# Patient Record
Sex: Male | Born: 1980 | Race: Black or African American | Hispanic: No | Marital: Single | Smoking: Current some day smoker
Health system: Southern US, Community
[De-identification: ages and names within clinical notes are randomized; demographics above are authoritative.]

## PROBLEM LIST (undated history)

## (undated) DIAGNOSIS — K219 Gastro-esophageal reflux disease without esophagitis: Secondary | ICD-10-CM

## (undated) DIAGNOSIS — I288 Other diseases of pulmonary vessels: Secondary | ICD-10-CM

## (undated) DIAGNOSIS — I319 Disease of pericardium, unspecified: Secondary | ICD-10-CM

## (undated) DIAGNOSIS — I10 Essential (primary) hypertension: Secondary | ICD-10-CM

## (undated) DIAGNOSIS — I314 Cardiac tamponade: Secondary | ICD-10-CM

## (undated) DIAGNOSIS — Z9289 Personal history of other medical treatment: Secondary | ICD-10-CM

## (undated) DIAGNOSIS — I519 Heart disease, unspecified: Secondary | ICD-10-CM

## (undated) DIAGNOSIS — G4730 Sleep apnea, unspecified: Secondary | ICD-10-CM

## (undated) DIAGNOSIS — E119 Type 2 diabetes mellitus without complications: Secondary | ICD-10-CM

## (undated) HISTORY — DX: Type 2 diabetes mellitus without complications: E11.9

## (undated) HISTORY — DX: Sleep apnea, unspecified: G47.30

## (undated) HISTORY — PX: OTHER SURGICAL HISTORY: SHX169

## (undated) HISTORY — DX: Personal history of other medical treatment: Z92.89

---
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
## 2003-12-06 ENCOUNTER — Emergency Department (HOSPITAL_COMMUNITY): Admission: EM | Admit: 2003-12-06 | Discharge: 2003-12-06 | Payer: Self-pay | Admitting: Family Medicine

## 2004-04-10 ENCOUNTER — Emergency Department (HOSPITAL_COMMUNITY): Admission: EM | Admit: 2004-04-10 | Discharge: 2004-04-10 | Payer: Self-pay | Admitting: Emergency Medicine

## 2004-08-08 ENCOUNTER — Emergency Department (HOSPITAL_COMMUNITY): Admission: EM | Admit: 2004-08-08 | Discharge: 2004-08-08 | Payer: Self-pay | Admitting: Family Medicine

## 2005-01-14 ENCOUNTER — Emergency Department (HOSPITAL_COMMUNITY): Admission: EM | Admit: 2005-01-14 | Discharge: 2005-01-14 | Payer: Self-pay | Admitting: Emergency Medicine

## 2005-01-28 ENCOUNTER — Emergency Department (HOSPITAL_COMMUNITY): Admission: EM | Admit: 2005-01-28 | Discharge: 2005-01-29 | Payer: Self-pay | Admitting: *Deleted

## 2005-02-16 ENCOUNTER — Emergency Department (HOSPITAL_COMMUNITY): Admission: EM | Admit: 2005-02-16 | Discharge: 2005-02-16 | Payer: Self-pay | Admitting: Emergency Medicine

## 2005-07-11 ENCOUNTER — Emergency Department (HOSPITAL_COMMUNITY): Admission: EM | Admit: 2005-07-11 | Discharge: 2005-07-11 | Payer: Self-pay | Admitting: Family Medicine

## 2005-10-28 ENCOUNTER — Emergency Department (HOSPITAL_COMMUNITY): Admission: EM | Admit: 2005-10-28 | Discharge: 2005-10-28 | Payer: Self-pay | Admitting: Family Medicine

## 2006-07-31 ENCOUNTER — Emergency Department (HOSPITAL_COMMUNITY): Admission: EM | Admit: 2006-07-31 | Discharge: 2006-07-31 | Payer: Self-pay | Admitting: Emergency Medicine

## 2006-09-08 ENCOUNTER — Emergency Department (HOSPITAL_COMMUNITY): Admission: EM | Admit: 2006-09-08 | Discharge: 2006-09-08 | Payer: Self-pay | Admitting: Emergency Medicine

## 2007-01-01 ENCOUNTER — Emergency Department (HOSPITAL_COMMUNITY): Admission: EM | Admit: 2007-01-01 | Discharge: 2007-01-01 | Payer: Self-pay | Admitting: Emergency Medicine

## 2007-04-15 ENCOUNTER — Emergency Department (HOSPITAL_COMMUNITY): Admission: EM | Admit: 2007-04-15 | Discharge: 2007-04-15 | Payer: Self-pay | Admitting: Family Medicine

## 2009-02-08 ENCOUNTER — Emergency Department (HOSPITAL_COMMUNITY): Admission: EM | Admit: 2009-02-08 | Discharge: 2009-02-09 | Payer: Self-pay | Admitting: Emergency Medicine

## 2009-05-16 ENCOUNTER — Emergency Department (HOSPITAL_COMMUNITY): Admission: EM | Admit: 2009-05-16 | Discharge: 2009-05-16 | Payer: Self-pay | Admitting: Family Medicine

## 2010-03-25 ENCOUNTER — Emergency Department (HOSPITAL_COMMUNITY): Admission: EM | Admit: 2010-03-25 | Discharge: 2010-03-25 | Payer: Self-pay | Admitting: Family Medicine

## 2010-10-23 ENCOUNTER — Ambulatory Visit (INDEPENDENT_AMBULATORY_CARE_PROVIDER_SITE_OTHER): Payer: Self-pay

## 2010-10-23 ENCOUNTER — Inpatient Hospital Stay (INDEPENDENT_AMBULATORY_CARE_PROVIDER_SITE_OTHER)
Admission: RE | Admit: 2010-10-23 | Discharge: 2010-10-23 | Disposition: A | Discharge status: Home / Self Care | Payer: Self-pay | Source: Ambulatory Visit | Attending: Emergency Medicine | Admitting: Emergency Medicine

## 2010-10-23 DIAGNOSIS — J45909 Unspecified asthma, uncomplicated: Secondary | ICD-10-CM

## 2010-10-23 DIAGNOSIS — J069 Acute upper respiratory infection, unspecified: Secondary | ICD-10-CM

## 2010-11-15 LAB — GC/CHLAMYDIA PROBE AMP, GENITAL: GC Probe Amp, Genital: NEGATIVE

## 2010-11-19 LAB — DIFFERENTIAL
Basophils Absolute: 0 10*3/uL (ref 0.0–0.1)
Lymphocytes Relative: 45 % (ref 12–46)
Neutro Abs: 2.2 10*3/uL (ref 1.7–7.7)

## 2010-11-19 LAB — COMPREHENSIVE METABOLIC PANEL
BUN: 10 mg/dL (ref 6–23)
CO2: 27 mEq/L (ref 19–32)
Calcium: 8.9 mg/dL (ref 8.4–10.5)
Chloride: 106 mEq/L (ref 96–112)
Creatinine, Ser: 1.06 mg/dL (ref 0.4–1.5)
GFR calc non Af Amer: >60 mL/min (ref >60)
Total Bilirubin: 0.6 mg/dL (ref 0.3–1.2)

## 2010-11-19 LAB — CBC
HCT: 41.2 % (ref 39.0–52.0)
MCHC: 32.7 g/dL (ref 30.0–36.0)
MCV: 73.8 fL — ABNORMAL LOW (ref 78.0–100.0)
Platelets: 240 10*3/uL (ref 150–400)
WBC: 5.7 10*3/uL (ref 4.0–10.5)

## 2010-11-19 LAB — LIPASE, BLOOD: Lipase: 15 U/L (ref 11–59)

## 2010-12-27 ENCOUNTER — Inpatient Hospital Stay (INDEPENDENT_AMBULATORY_CARE_PROVIDER_SITE_OTHER)
Admission: RE | Admit: 2010-12-27 | Discharge: 2010-12-27 | Disposition: A | Discharge status: Home / Self Care | Payer: Self-pay | Source: Ambulatory Visit

## 2010-12-27 DIAGNOSIS — J019 Acute sinusitis, unspecified: Secondary | ICD-10-CM

## 2010-12-29 ENCOUNTER — Emergency Department (HOSPITAL_COMMUNITY)
Admission: EM | Admit: 2010-12-29 | Discharge: 2010-12-29 | Disposition: A | Discharge status: Home / Self Care | Payer: Self-pay | Attending: Emergency Medicine | Admitting: Emergency Medicine

## 2010-12-29 ENCOUNTER — Emergency Department (HOSPITAL_COMMUNITY): Payer: Self-pay

## 2010-12-29 DIAGNOSIS — Z79899 Other long term (current) drug therapy: Secondary | ICD-10-CM | POA: Insufficient documentation

## 2010-12-29 DIAGNOSIS — I10 Essential (primary) hypertension: Secondary | ICD-10-CM | POA: Insufficient documentation

## 2010-12-29 DIAGNOSIS — J40 Bronchitis, not specified as acute or chronic: Secondary | ICD-10-CM | POA: Insufficient documentation

## 2010-12-29 DIAGNOSIS — R05 Cough: Secondary | ICD-10-CM | POA: Insufficient documentation

## 2010-12-29 DIAGNOSIS — R059 Cough, unspecified: Secondary | ICD-10-CM | POA: Insufficient documentation

## 2010-12-29 DIAGNOSIS — J329 Chronic sinusitis, unspecified: Secondary | ICD-10-CM | POA: Insufficient documentation

## 2010-12-29 DIAGNOSIS — R0602 Shortness of breath: Secondary | ICD-10-CM | POA: Insufficient documentation

## 2011-08-22 ENCOUNTER — Encounter (HOSPITAL_COMMUNITY): Payer: Self-pay | Admitting: Emergency Medicine

## 2011-08-22 ENCOUNTER — Emergency Department (HOSPITAL_COMMUNITY)
Admission: EM | Admit: 2011-08-22 | Discharge: 2011-08-22 | Disposition: A | Discharge status: Home / Self Care | Payer: Self-pay | Source: Home / Self Care | Attending: Family Medicine | Admitting: Family Medicine

## 2011-08-22 DIAGNOSIS — J069 Acute upper respiratory infection, unspecified: Secondary | ICD-10-CM

## 2011-08-22 MED ORDER — HYDROCOD POLST-CHLORPHEN POLST 10-8 MG/5ML PO LQCR: 5.0000 mL | Freq: Two times a day (BID) | ORAL | Status: DC

## 2011-08-22 MED ORDER — IPRATROPIUM BROMIDE 0.06 % NA SOLN: 2.0000 | Freq: Four times a day (QID) | NASAL | Status: DC

## 2011-08-22 NOTE — ED Notes (Signed)
Nasal congestion and cough since Monday. Sore throat, aches.

## 2011-08-22 NOTE — ED Provider Notes (Signed)
History     CSN: [id number]  Arrival date & time 08/22/11  1813   First MD Initiated Contact with Patient 08/22/11 1831      Chief Complaint  Patient presents with  . Nasal Congestion    (Consider location/radiation/quality/duration/timing/severity/associated sxs/prior treatment) Patient is a 31 y.o. male presenting with URI. The history is provided by the patient.  URI The primary symptoms include sore throat and cough. Primary symptoms do not include wheezing, nausea, vomiting, myalgias or rash. The current episode started 3 to 5 days ago. This is a new problem. The problem has not changed since onset. The onset of the illness is associated with exposure to sick contacts. Symptoms associated with the illness include congestion and rhinorrhea.    History reviewed. No pertinent past medical history.  History reviewed. No pertinent past surgical history.  History reviewed. No pertinent family history.  History  Substance Use Topics  . Smoking status: Never Smoker   . Smokeless tobacco: Not on file  . Alcohol Use: No      Review of Systems  Constitutional: Negative.   HENT: Positive for congestion, sore throat, rhinorrhea and postnasal drip.   Respiratory: Positive for cough. Negative for shortness of breath and wheezing.   Gastrointestinal: Negative.  Negative for nausea and vomiting.  Musculoskeletal: Negative for myalgias.  Skin: Negative for rash.    Allergies  Review of patient's allergies indicates no known allergies.  Home Medications   Current Outpatient Rx  Name Route Sig Dispense Refill  . HYDROCOD POLST-CPM POLST ER 10-8 MG/5ML PO LQCR Oral Take 5 mLs by mouth every 12 (twelve) hours. 115 mL 0  . IPRATROPIUM BROMIDE 0.06 % NA SOLN Nasal Place 2 sprays into the nose 4 (four) times daily. 15 mL 12    BP 152/92  Pulse 88  Temp(Src) 98.7 F (37.1 C) (Oral)  Resp 18  SpO2 99%  Physical Exam  Nursing note and vitals reviewed. Constitutional: He is  oriented to person, place, and time. He appears well-developed and well-nourished.  HENT:  Head: Normocephalic.  Right Ear: External ear normal.  Left Ear: External ear normal.  Nose: Nose normal.  Mouth/Throat: Oropharynx is clear and moist.  Eyes: Conjunctivae and EOM are normal. Pupils are equal, round, and reactive to light.  Neck: Normal range of motion. Neck supple.  Cardiovascular: Normal rate and normal heart sounds.   Pulmonary/Chest: Effort normal and breath sounds normal.  Lymphadenopathy:    He has cervical adenopathy.  Neurological: He is alert and oriented to person, place, and time.  Skin: Skin is warm and dry. No rash noted.  Psychiatric: He has a normal mood and affect.    ED Course  Procedures (including critical care time)  Labs Reviewed - No data to display No results found.   1. URI (upper respiratory infection)       MDM          Barkley Bruns, MD 08/22/11 2007

## 2012-01-16 ENCOUNTER — Emergency Department (HOSPITAL_COMMUNITY): Payer: Self-pay

## 2012-01-16 ENCOUNTER — Encounter (HOSPITAL_COMMUNITY): Payer: Self-pay | Admitting: *Deleted

## 2012-01-16 ENCOUNTER — Emergency Department (HOSPITAL_COMMUNITY)
Admission: EM | Admit: 2012-01-16 | Discharge: 2012-01-16 | Disposition: A | Discharge status: Home / Self Care | Payer: Self-pay | Attending: Emergency Medicine | Admitting: Emergency Medicine

## 2012-01-16 DIAGNOSIS — R042 Hemoptysis: Secondary | ICD-10-CM | POA: Insufficient documentation

## 2012-01-16 DIAGNOSIS — R0609 Other forms of dyspnea: Secondary | ICD-10-CM | POA: Insufficient documentation

## 2012-01-16 DIAGNOSIS — Z79899 Other long term (current) drug therapy: Secondary | ICD-10-CM | POA: Insufficient documentation

## 2012-01-16 DIAGNOSIS — J189 Pneumonia, unspecified organism: Secondary | ICD-10-CM | POA: Insufficient documentation

## 2012-01-16 DIAGNOSIS — R0989 Other specified symptoms and signs involving the circulatory and respiratory systems: Secondary | ICD-10-CM | POA: Insufficient documentation

## 2012-01-16 LAB — POCT I-STAT, CHEM 8
BUN: 12 mg/dL (ref 6–23)
Chloride: 102 mEq/L (ref 96–112)
Creatinine, Ser: 1.1 mg/dL (ref 0.50–1.35)
Hemoglobin: 16 g/dL (ref 13.0–17.0)
Potassium: 3.8 mEq/L (ref 3.5–5.1)
Sodium: 141 mEq/L (ref 135–145)

## 2012-01-16 LAB — POCT I-STAT TROPONIN I

## 2012-01-16 LAB — CBC
Hemoglobin: 13.3 g/dL (ref 13.0–17.0)
RBC: 5.73 MIL/uL (ref 4.22–5.81)
WBC: 5 10*3/uL (ref 4.0–10.5)

## 2012-01-16 MED ORDER — DEXTROSE 5 % IV SOLN
1.0000 g | Freq: Once | INTRAVENOUS | Status: AC
  Administered 2012-01-16: 1 g via INTRAVENOUS
  Filled 2012-01-16: qty 10

## 2012-01-16 MED ORDER — DOXYCYCLINE HYCLATE 100 MG PO TABS
100.0000 mg | ORAL_TABLET | Freq: Once | ORAL | Status: AC
  Administered 2012-01-16: 100 mg via ORAL
  Filled 2012-01-16: qty 1

## 2012-01-16 MED ORDER — DOXYCYCLINE HYCLATE 100 MG PO CAPS: 100.0000 mg | ORAL_CAPSULE | Freq: Two times a day (BID) | ORAL | Status: AC

## 2012-01-16 NOTE — ED Notes (Addendum)
Pt states he was tx for bronchitis 1 month prior at urgent care.  He has been using his inhaler since then with no relief.  Pt states increased sternal pain and L flank pain x 1 week.  Today he began coughing up blood.  Pt states that he has also been experiencing night sweats and chills.  Emesis x 1 yesterday (not post coughing).

## 2012-01-16 NOTE — ED Provider Notes (Signed)
History     CSN: [id number]  Arrival date & time 01/16/12  2054   First MD Initiated Contact with Patient 01/16/12 2207      Chief Complaint  Patient presents with  . Chest Pain  . Hemoptysis    (Consider location/radiation/quality/duration/timing/severity/associated sxs/prior treatment) HPI  31 year old male without any meaningful medical history presents today with a two-week history of cough and shortness of breath. The cough is nonproductive for the last one week. His more recently had a left lateral chest pain and some substernal pain as well only when coughing. He denies any arm or jaw pain. He does endorse dyspnea with exertion. He also endorses chills although he has not taken his temperature at home. He was treated for bronchitis approximately one month ago.    he endorses a single episode of emesis after coughing yesterday. Otherwise normal stooling, urine output, and negative for nausea and vomiting. On arrival temperature 98.28F, pulse 82, respirations 22, saturations 98% on room air.  History reviewed. No pertinent past medical history.  History reviewed. No pertinent past surgical history.  No family history on file.  History  Substance Use Topics  . Smoking status: Never Smoker   . Smokeless tobacco: Not on file  . Alcohol Use: No      Review of Systems Constitutional: Negative for fever and POS chills.  HENT: Negative for ear pain, sore throat and trouble swallowing.   Eyes: Negative for pain and visual disturbance.  Respiratory: POS for cough and shortness of breath.   Cardiovascular: POS for chest pain (only while coughing) and neg leg swelling.  Gastrointestinal: Negative for nausea, vomiting, abdominal pain and diarrhea.  Genitourinary: Negative for dysuria, urgency and frequency.  Musculoskeletal: Negative for back pain and joint swelling.  Skin: Negative for rash and wound.  Neurological: Negative for dizziness, syncope, speech difficulty, weakness  and numbness.    Allergies  Review of patient's allergies indicates no known allergies.  Home Medications   Current Outpatient Rx  Name Route Sig Dispense Refill  . NYQUIL COLD & FLU PO Oral Take 1 tablet by mouth as needed. For cough and cold.    . IBUPROFEN 800 MG PO TABS Oral Take 800 mg by mouth 2 (two) times daily.    . NYQUIL PO Oral Take 1 tablet by mouth at bedtime.      Pulse 82  Temp(Src) 98.8 F (37.1 C) (Oral)  Resp 22  SpO2 98%  Physical Exam Consitutional: Pt in no acute distress.   Head: Normocephalic and atraumatic.  Eyes: Extraocular motion intact, no scleral icterus Neck: Supple without meningismus, mass, or overt JVD Respiratory: Effort normal. Course BSs.  Scattered wheezing LUL, and subtle rales LLL. No respiratory distress. CV: Heart regular rate and rhythm, no obvious murmurs.  Pulses +2 and symmetric Abdomen: Soft, non-tender, non-distended MSK: Extremities are atraumatic without deformity, ROM intact Skin: Warm, dry, intact Neuro: Alert and oriented, no motor deficit noted.  Psychiatric: Mood and affect are normal    ED Course  Procedures (including critical care time)  Labs Reviewed  CBC - Abnormal; Notable for the following:    MCV 71.0 (*)    MCH 23.2 (*)    All other components within normal limits  POCT I-STAT, CHEM 8  POCT I-STAT TROPONIN I   Dg Chest 2 View  01/16/2012  *RADIOLOGY REPORT*  Clinical Data: Cough and shortness of breath.  CHEST - 2 VIEW  Comparison: 12/29/2010.  Findings: The cardiac silhouette, mediastinal and hilar  contours are within normal limits and stable.  There are bronchitic type lung changes suggesting bronchitis.  There is also a superimposed left lower lobe infiltrate.  No pleural effusion.  The bony thorax is intact.  IMPRESSION: Bronchitis and left lower lobe pneumonia.  Original Report Authenticated By: P. Loralie Champagne, M.D.     No diagnosis found.    MDM  This patient looks well exam with normal  vital signs good oxygenation and mentation. His exam is consistent with x-ray findings of community-acquired pneumonia. White count normal his saturations very good. Patient will be treated here with Rocephin, discharged home on doxycycline. Patient to follow up with primary care for followup or to return to the emergency department with any concerns or exacerbations. PT DC home stable.  Discussed with pt the clinical impression, treatment in the ED, and follow up plan.  We alslo discussed the indications for returning to the ED, which include shortness or breath, confusion, fever, new weakness or numbness, chest pain, or any other concerning symptom.  The pt understood the treatment and plan, is stable, and is able to leave the ED.    Jonetta Osgood MD 01/16/2012    11:50 PM          Larrie Kass, MD 01/16/12 2350

## 2012-01-16 NOTE — Discharge Instructions (Signed)
Follow up with your providers as dicussed in the ED today and as written above.  See your doctor immediately--or return to the ED--with any new or troubling symptoms including fevers, weakness, new chest pain, shortness or breath, numbness, or any other concerning symptom.  Pneumonia, Adult  Pneumonia is an infection of the lungs.  CAUSES Pneumonia may be caused by bacteria or a virus. Usually, these infections are caused by breathing infectious particles into the lungs (respiratory tract). SYMPTOMS   Cough.   Fever.   Chest pain.   Increased rate of breathing.   Wheezing.   Mucus production.  DIAGNOSIS  If you have the common symptoms of pneumonia, your caregiver will typically confirm the diagnosis with a chest X-ray. The X-ray will show an abnormality in the lung (pulmonary infiltrate) if you have pneumonia. Other tests of your blood, urine, or sputum may be done to find the specific cause of your pneumonia. Your caregiver may also do tests (blood gases or pulse oximetry) to see how well your lungs are working. TREATMENT  Some forms of pneumonia may be spread to other people when you cough or sneeze. You may be asked to wear a mask before and during your exam. Pneumonia that is caused by bacteria is treated with antibiotic medicine. Pneumonia that is caused by the influenza virus may be treated with an antiviral medicine. Most other viral infections must run their course. These infections will not respond to antibiotics.  PREVENTION A pneumococcal shot (vaccine) is available to prevent a common bacterial cause of pneumonia. This is usually suggested for:  People over 69 years old.   Patients on chemotherapy.   People with chronic lung problems, such as bronchitis or emphysema.   People with immune system problems.  If you are over 65 or have a high risk condition, you may receive the pneumococcal vaccine if you have not received it before. In some countries, a routine influenza  vaccine is also recommended. This vaccine can help prevent some cases of pneumonia.You may be offered the influenza vaccine as part of your care. If you smoke, it is time to quit. You may receive instructions on how to stop smoking. Your caregiver can provide medicines and counseling to help you quit. HOME CARE INSTRUCTIONS   Cough suppressants may be used if you are losing too much rest. However, coughing protects you by clearing your lungs. You should avoid using cough suppressants if you can.   Your caregiver may have prescribed medicine if he or she thinks your pneumonia is caused by a bacteria or influenza. Finish your medicine even if you start to feel better.   Your caregiver may also prescribe an expectorant. This loosens the mucus to be coughed up.   Only take over-the-counter or prescription medicines for pain, discomfort, or fever as directed by your caregiver.   Do not smoke. Smoking is a common cause of bronchitis and can contribute to pneumonia. If you are a smoker and continue to smoke, your cough may last several weeks after your pneumonia has cleared.   A cold steam vaporizer or humidifier in your room or home may help loosen mucus.   Coughing is often worse at night. Sleeping in a semi-upright position in a recliner or using a couple pillows under your head will help with this.   Get rest as you feel it is needed. Your body will usually let you know when you need to rest.  SEEK IMMEDIATE MEDICAL CARE IF:   Your illness  becomes worse. This is especially true if you are elderly or weakened from any other disease.   You cannot control your cough with suppressants and are losing sleep.   You begin coughing up blood.   You develop pain which is getting worse or is uncontrolled with medicines.   You have a fever.   Any of the symptoms which initially brought you in for treatment are getting worse rather than better.   You develop shortness of breath or chest pain.  MAKE  SURE YOU:   Understand these instructions.   Will watch your condition.   Will get help right away if you are not doing well or get worse.  Document Released: 07/29/2005 Document Revised: 07/18/2011 Document Reviewed: 10/18/2010 [facility] Patient Information 2012 [place], [place].

## 2012-01-16 NOTE — ED Notes (Signed)
Pt not in room, in xray at this time

## 2012-01-18 NOTE — ED Provider Notes (Addendum)
I saw and evaluated the patient, reviewed the resident's note and I agree with the findings and plan.   Loren Racer, MD 01/18/12 0710   Date: 02/15/2012  Rate: 85  Rhythm: normal sinus rhythm  QRS Axis: normal  Intervals: normal  ST/T Wave abnormalities: normal  Conduction Disutrbances:none  Narrative Interpretation:   Old EKG Reviewed: none available    Loren Racer, MD 02/15/12 [phone]

## 2012-02-23 ENCOUNTER — Encounter (HOSPITAL_COMMUNITY): Payer: Self-pay | Admitting: Emergency Medicine

## 2012-02-23 ENCOUNTER — Emergency Department (HOSPITAL_COMMUNITY)
Admission: EM | Admit: 2012-02-23 | Discharge: 2012-02-24 | Disposition: A | Discharge status: Home / Self Care | Payer: PRIVATE HEALTH INSURANCE | Attending: Emergency Medicine | Admitting: Emergency Medicine

## 2012-02-23 DIAGNOSIS — H5710 Ocular pain, unspecified eye: Secondary | ICD-10-CM | POA: Insufficient documentation

## 2012-02-23 DIAGNOSIS — H5711 Ocular pain, right eye: Secondary | ICD-10-CM

## 2012-02-23 MED ORDER — FLUORESCEIN SODIUM 1 MG OP STRP
1.0000 | ORAL_STRIP | Freq: Once | OPHTHALMIC | Status: AC
  Administered 2012-02-24: 1 via OPHTHALMIC
  Filled 2012-02-23: qty 1

## 2012-02-23 MED ORDER — PROPARACAINE HCL 0.5 % OP SOLN
1.0000 [drp] | Freq: Once | OPHTHALMIC | Status: AC
  Administered 2012-02-24: 1 [drp] via OPHTHALMIC
  Filled 2012-02-23: qty 15

## 2012-02-23 NOTE — ED Notes (Addendum)
Patient complaining of right eye irritation that started today.  Patient felt that his contact was irritating him, so he removed the contact.  Patient states that his vision is in "grayscale" with his right eye (new onset today); can see colors with his left eye.  Eye is red upon assessment; clear drainage noted.  Patient denies any loss of vision (other than change in color); reports light sensitivity.  Pupils equal and reactive.

## 2012-02-24 MED ORDER — MOXIFLOXACIN HCL 0.5 % OP SOLN: 1.0000 [drp] | Freq: Three times a day (TID) | OPHTHALMIC | Status: AC

## 2012-02-24 NOTE — ED Notes (Signed)
Vigamox too expensive per pt.  Requesting diff med.  I called C. Latanya Maudlin and she gave a verbal order for Ciloxan opthalmic solution, place 1 drop into the Right eye TID, #32mls.  This rx was called into [facility] at [phone]. And pt was notified.

## 2012-02-24 NOTE — ED Provider Notes (Signed)
History     CSN: [id number]  Arrival date & time 02/23/12  2247   First MD Initiated Contact with Patient 02/23/12 2323      Chief Complaint  Patient presents with  . Eye Pain    (Consider location/radiation/quality/duration/timing/severity/associated sxs/prior treatment) HPI History from patient. 31 year old male presents with right eye pain. He is a contact lens wearer and reports that he began to experience blurry vision to that eye earlier in the day today. No known injury. No known foreign body exposure. Patient did take out his contact but his symptoms persisted. He does not wear daily disposables. He used Visine eyedrops without relief of his symptoms. No history of anything like this before. States he feels as if there is something stuck in his eye and has noticed increased redness and watering. He was seen by an optometrist or ophthalmologist in [place] previously. Not currently followed by a local physician.  The triage note states that the patient was complaining of loss of color vision but he denies this to me, stating that his vision is simply blurry.  History reviewed. No pertinent past medical history.  History reviewed. No pertinent past surgical history.  History reviewed. No pertinent family history.  History  Substance Use Topics  . Smoking status: Never Smoker   . Smokeless tobacco: Not on file  . Alcohol Use: No      Review of Systems  Constitutional: Negative.   HENT: Negative for rhinorrhea.   Eyes: Positive for photophobia, pain, discharge, redness and visual disturbance. Negative for itching.  Respiratory: Negative for chest tightness and shortness of breath.   Neurological: Negative for dizziness and weakness.    Allergies  Review of patient's allergies indicates no known allergies.  Home Medications   Current Outpatient Rx  Name Route Sig Dispense Refill  . IBUPROFEN 800 MG PO TABS Oral Take 800 mg by mouth 2 (two) times daily.      BP  143/101  Pulse 76  Temp 98.7 F (37.1 C) (Oral)  Resp 20  SpO2 99%  Vision 20/100 in right eye (uncorrected), 20/20 in the left eye (corrected) Physical Exam  Nursing note and vitals reviewed. Constitutional: He appears well-developed and well-nourished. No distress.  HENT:  Head: Normocephalic and atraumatic.  Eyes: Conjunctivae, EOM and lids are normal. Pupils are equal, round, and reactive to light. No foreign bodies found.  Slit lamp exam:      The right eye shows fluorescein uptake. The right eye shows no corneal ulcer and no foreign body.       Punctate uptake of fluorescein, sclera mildly injected, excess tearing  Neck: Normal range of motion.  Cardiovascular: Normal rate.   Pulmonary/Chest: Effort normal.  Musculoskeletal: Normal range of motion.  Neurological: He is alert.  Skin: Skin is warm and dry. He is not diaphoretic.  Psychiatric: He has a normal mood and affect.    ED Course  Procedures (including critical care time)  Labs Reviewed - No data to display No results found.   1. Pain in right eye       MDM   patient presents with pain to his right eye. He is a contact lens wearer. No known injury but he does complain of foreign body sensation. He had immediate relief with administration of proparacaine drops. Patient examined by both myself and Dr. Nicanor Alcon. Patient does appear to have punctate uptake of fluorescein, and given his symptoms, we'll give coverage with antibiotic drops. He was instructed that he needs  to follow up with ophthalmology tomorrow for recheck. He is to discard his current peer contacts and wear glasses for the next week while taking the drops. Reasons to return to the ED discussed.       Grant Fontana, PA-C 02/24/12 [phone]

## 2012-02-24 NOTE — ED Provider Notes (Signed)
Medical screening examination/treatment/procedure(s) were performed by non-physician practitioner and as supervising physician I was immediately available for consultation/collaboration.  Lorris Carducci K Amardeep Beckers-Rasch, MD 02/24/12 0539 

## 2012-11-15 ENCOUNTER — Emergency Department (HOSPITAL_COMMUNITY)
Admission: EM | Admit: 2012-11-15 | Discharge: 2012-11-15 | Disposition: A | Discharge status: Home / Self Care | Payer: Self-pay | Attending: Emergency Medicine | Admitting: Emergency Medicine

## 2012-11-15 ENCOUNTER — Encounter (HOSPITAL_COMMUNITY): Payer: Self-pay | Admitting: Emergency Medicine

## 2012-11-15 DIAGNOSIS — K529 Noninfective gastroenteritis and colitis, unspecified: Secondary | ICD-10-CM

## 2012-11-15 DIAGNOSIS — Z791 Long term (current) use of non-steroidal anti-inflammatories (NSAID): Secondary | ICD-10-CM | POA: Insufficient documentation

## 2012-11-15 DIAGNOSIS — R197 Diarrhea, unspecified: Secondary | ICD-10-CM | POA: Insufficient documentation

## 2012-11-15 DIAGNOSIS — K5289 Other specified noninfective gastroenteritis and colitis: Secondary | ICD-10-CM | POA: Insufficient documentation

## 2012-11-15 DIAGNOSIS — R638 Other symptoms and signs concerning food and fluid intake: Secondary | ICD-10-CM | POA: Insufficient documentation

## 2012-11-15 DIAGNOSIS — R5381 Other malaise: Secondary | ICD-10-CM | POA: Insufficient documentation

## 2012-11-15 DIAGNOSIS — R509 Fever, unspecified: Secondary | ICD-10-CM | POA: Insufficient documentation

## 2012-11-15 DIAGNOSIS — R1013 Epigastric pain: Secondary | ICD-10-CM | POA: Insufficient documentation

## 2012-11-15 DIAGNOSIS — R5383 Other fatigue: Secondary | ICD-10-CM | POA: Insufficient documentation

## 2012-11-15 LAB — COMPREHENSIVE METABOLIC PANEL
BUN: 13 mg/dL (ref 6–23)
CO2: 29 mEq/L (ref 19–32)
Calcium: 9.2 mg/dL (ref 8.4–10.5)
Chloride: 99 mEq/L (ref 96–112)
Creatinine, Ser: 0.93 mg/dL (ref 0.50–1.35)
GFR calc Af Amer: >90 mL/min (ref >90)
GFR calc non Af Amer: >90 mL/min (ref >90)
Glucose, Bld: 101 mg/dL — ABNORMAL HIGH (ref 70–99)
Total Bilirubin: 0.6 mg/dL (ref 0.3–1.2)

## 2012-11-15 LAB — URINALYSIS, ROUTINE W REFLEX MICROSCOPIC
Glucose, UA: NEGATIVE mg/dL
Ketones, ur: NEGATIVE mg/dL
Leukocytes, UA: NEGATIVE
Nitrite: NEGATIVE
Protein, ur: NEGATIVE mg/dL
Urobilinogen, UA: 0.2 mg/dL (ref 0.0–1.0)

## 2012-11-15 LAB — CBC WITH DIFFERENTIAL/PLATELET
Eosinophils Relative: 3 % (ref 0–5)
HCT: 41.6 % (ref 39.0–52.0)
Lymphs Abs: 1.3 10*3/uL (ref 0.7–4.0)
MCH: 23.3 pg — ABNORMAL LOW (ref 26.0–34.0)
MCV: 71.4 fL — ABNORMAL LOW (ref 78.0–100.0)
Monocytes Absolute: 0.3 10*3/uL (ref 0.1–1.0)
Monocytes Relative: 5 % (ref 3–12)
Neutro Abs: 4.4 10*3/uL (ref 1.7–7.7)
Platelets: 273 10*3/uL (ref 150–400)
RBC: 5.83 MIL/uL — ABNORMAL HIGH (ref 4.22–5.81)
RDW: 15 % (ref 11.5–15.5)
WBC: 6.2 10*3/uL (ref 4.0–10.5)

## 2012-11-15 LAB — LIPASE, BLOOD: Lipase: 16 U/L (ref 11–59)

## 2012-11-15 MED ORDER — ONDANSETRON HCL 4 MG/2ML IJ SOLN
4.0000 mg | Freq: Once | INTRAMUSCULAR | Status: AC
  Administered 2012-11-15: 4 mg via INTRAVENOUS
  Filled 2012-11-15: qty 2

## 2012-11-15 MED ORDER — ONDANSETRON HCL 4 MG PO TABS: 4.0000 mg | ORAL_TABLET | Freq: Four times a day (QID) | ORAL | Status: DC

## 2012-11-15 MED ORDER — SODIUM CHLORIDE 0.9 % IV BOLUS (SEPSIS)
1000.0000 mL | Freq: Once | INTRAVENOUS | Status: AC
  Administered 2012-11-15: 1000 mL via INTRAVENOUS

## 2012-11-15 NOTE — ED Provider Notes (Signed)
History     CSN: [id number]  Arrival date & time 11/15/12  4098   First MD Initiated Contact with Patient 11/15/12 [phone]      Chief Complaint  Patient presents with  . Emesis  . Diarrhea  . Abdominal Pain    (Consider location/radiation/quality/duration/timing/severity/associated sxs/prior treatment) HPI Comments: Patient presents with nausea, vomiting and diarrhea that onset at 6 PM yesterday. He's had 2 episodes of nonbloody nonbilious emesis with cramping and loose stools. She's had frequent loose bowel movements overnight. States he had a fever of 104 yesterday. Denies any chest pain, shortness of breath, lightheadedness or dizziness. Has some crampy upper abdominal pain that improves with nothing. Unable to get a drink since yesterday morning. Father was admitted yesterday with similar symptoms. son developed symptoms this morning and is being seen as well.  The history is provided by the patient and a relative.    History reviewed. No pertinent past medical history.  History reviewed. No pertinent past surgical history.  No family history on file.  History  Substance Use Topics  . Smoking status: Never Smoker   . Smokeless tobacco: Not on file  . Alcohol Use: No      Review of Systems  Constitutional: Positive for fever, activity change, appetite change and fatigue.  HENT: Negative for congestion and rhinorrhea.   Respiratory: Negative for cough, chest tightness and shortness of breath.   Cardiovascular: Negative for chest pain.  Gastrointestinal: Positive for nausea, vomiting, abdominal pain and diarrhea.  Genitourinary: Negative for dysuria.  Musculoskeletal: Negative for back pain.  Skin: Negative for rash.  Neurological: Positive for weakness. Negative for dizziness and headaches.  A complete 10 system review of systems was obtained and all systems are negative except as noted in the HPI and PMH.    Allergies  Review of patient's allergies indicates no known  allergies.  Home Medications   Current Outpatient Rx  Name  Route  Sig  Dispense  Refill  . ibuprofen (ADVIL,MOTRIN) 800 MG tablet   Oral   Take 800 mg by mouth 2 (two) times daily.         . ondansetron (ZOFRAN) 4 MG tablet   Oral   Take 1 tablet (4 mg total) by mouth every 6 (six) hours.   12 tablet   0     BP 116/62  Pulse 89  Temp(Src) 99.3 F (37.4 C) (Oral)  Resp 18  SpO2 100%  Physical Exam  Constitutional: He is oriented to person, place, and time. He appears well-developed and well-nourished. No distress.  HENT:  Head: Normocephalic and atraumatic.  Mouth/Throat: Oropharynx is clear and moist. No oropharyngeal exudate.  Eyes: Conjunctivae and EOM are normal. Pupils are equal, round, and reactive to light.  Neck: Normal range of motion. Neck supple.  Cardiovascular: Normal rate, regular rhythm and normal heart sounds.   No murmur heard. Pulmonary/Chest: Effort normal and breath sounds normal. No respiratory distress.  Abdominal: Soft. There is tenderness. There is no rebound and no guarding.  Mild epigastric tenderness Negative Murphy's sign. No pain at McBurney's point  Musculoskeletal: Normal range of motion. He exhibits no edema and no tenderness.  Neurological: He is alert and oriented to person, place, and time. No cranial nerve deficit. He exhibits normal muscle tone. Coordination normal.  Skin: Skin is warm.    ED Course  Procedures (including critical care time)  Labs Reviewed  CBC WITH DIFFERENTIAL - Abnormal; Notable for the following:    RBC 5.83 (*)  MCV 71.4 (*)    MCH 23.3 (*)    All other components within normal limits  COMPREHENSIVE METABOLIC PANEL - Abnormal; Notable for the following:    Glucose, Bld 101 (*)    All other components within normal limits  URINALYSIS, ROUTINE W REFLEX MICROSCOPIC - Abnormal; Notable for the following:    Specific Gravity, Urine 1.036 (*)    All other components within normal limits  LIPASE, BLOOD    No results found.   1. Gastroenteritis       MDM  Nausea, vomiting and diarrhea for the past 8 hours was sick contacts at home. Afebrile, no distress, abdomen soft and nonsurgical. Given IV hydration, antiemetics. Tolerating by mouth in the ED. No vomiting. No ketones in urine.  Suspect viral gastroenteritis given similar symptoms and was. Patient appears nontoxic and well-hydrated. Antiemetics and oral hydration at home. Return precautions discussed.       Glynn Octave, MD 11/15/12 [phone]

## 2012-11-15 NOTE — ED Notes (Signed)
Onset of diarrhea and vomiting onset 1800 yesterday. Describes diarrhea 4-5 x times every hour, not as much vomiting. Some mid abdominal pain.

## 2013-05-27 ENCOUNTER — Encounter (HOSPITAL_COMMUNITY): Payer: Self-pay | Admitting: Emergency Medicine

## 2013-05-27 ENCOUNTER — Emergency Department (HOSPITAL_COMMUNITY)
Admission: EM | Admit: 2013-05-27 | Discharge: 2013-05-28 | Disposition: A | Discharge status: Home / Self Care | Payer: PRIVATE HEALTH INSURANCE | Attending: Emergency Medicine | Admitting: Emergency Medicine

## 2013-05-27 DIAGNOSIS — R1013 Epigastric pain: Secondary | ICD-10-CM | POA: Insufficient documentation

## 2013-05-27 DIAGNOSIS — Z791 Long term (current) use of non-steroidal anti-inflammatories (NSAID): Secondary | ICD-10-CM | POA: Insufficient documentation

## 2013-05-27 DIAGNOSIS — K219 Gastro-esophageal reflux disease without esophagitis: Secondary | ICD-10-CM | POA: Insufficient documentation

## 2013-05-27 LAB — CBC
HCT: 38.7 % — ABNORMAL LOW (ref 39.0–52.0)
MCHC: 33.9 g/dL (ref 30.0–36.0)
Platelets: 262 10*3/uL (ref 150–400)
RDW: 14.8 % (ref 11.5–15.5)

## 2013-05-27 LAB — POCT I-STAT TROPONIN I: Troponin i, poc: 0 ng/mL (ref 0.00–0.08)

## 2013-05-27 LAB — BASIC METABOLIC PANEL
BUN: 13 mg/dL (ref 6–23)
GFR calc Af Amer: >90 mL/min (ref >90)
GFR calc non Af Amer: >90 mL/min (ref >90)
Potassium: 3.6 mEq/L (ref 3.5–5.1)

## 2013-05-27 MED ORDER — GI COCKTAIL ~~LOC~~
30.0000 mL | Freq: Once | ORAL | Status: AC
  Administered 2013-05-28: 30 mL via ORAL
  Filled 2013-05-27: qty 30

## 2013-05-27 MED ORDER — FAMOTIDINE 20 MG PO TABS
40.0000 mg | ORAL_TABLET | Freq: Once | ORAL | Status: AC
  Administered 2013-05-28: 40 mg via ORAL
  Filled 2013-05-27: qty 2

## 2013-05-27 NOTE — ED Notes (Signed)
Presents with sternal chest pain that began Monday night described as "just a hurt" worse with eating and drinking.  On Tuesday pt reports pain with swallowing and "it feels like the food tearing me up and it wants to come back up" denies SOB, denies nausea. Nothing makes pain better.

## 2013-05-27 NOTE — ED Provider Notes (Signed)
CSN: [id number]     Arrival date & time 05/27/13  2213 History   First MD Initiated Contact with Patient 05/27/13 2327     Chief Complaint  Patient presents with  . Chest Pain   (Consider location/radiation/quality/duration/timing/severity/associated sxs/prior Treatment) Patient is a 32 y.o. male presenting with chest pain. The history is provided by the patient.  Chest Pain  patient here complaining of epigastric pain that is worse with eating and drinking x4 days. Pain characterized as burning and nonradiating. No associated syncope or near-syncope. No anginal type symptoms of exertional dyspnea or diaphoresis. No vomiting or diarrhea. Denies any black or bloody stools. No treatment used prior to arrival. No history of same. Denies any orthopnea or dyspnea on exertion. Patient has not seen a provider prior to arrival. Denies any surgical abdominal history. History reviewed. No pertinent past medical history. History reviewed. No pertinent past surgical history. History reviewed. No pertinent family history. History  Substance Use Topics  . Smoking status: Never Smoker   . Smokeless tobacco: Not on file  . Alcohol Use: No    Review of Systems  Cardiovascular: Positive for chest pain.  All other systems reviewed and are negative.    Allergies  Review of patient's allergies indicates no known allergies.  Home Medications   Current Outpatient Rx  Name  Route  Sig  Dispense  Refill  . bismuth subsalicylate (PEPTO BISMOL) 262 MG chewable tablet   Oral   Chew 1,572 mg by mouth daily as needed for indigestion.         Marland Kitchen ibuprofen (ADVIL,MOTRIN) 800 MG tablet   Oral   Take 800 mg by mouth 2 (two) times daily.          BP 146/75  Pulse 97  Temp(Src) 98.4 F (36.9 C) (Oral)  Resp 20  Wt 378 lb 7 oz (171.658 kg)  SpO2 100% Physical Exam  Nursing note and vitals reviewed. Constitutional: He is oriented to person, place, and time. He appears well-developed and  well-nourished.  Non-toxic appearance. No distress.  HENT:  Head: Normocephalic and atraumatic.  Eyes: Conjunctivae, EOM and lids are normal. Pupils are equal, round, and reactive to light.  Neck: Normal range of motion. Neck supple. No tracheal deviation present. No mass present.  Cardiovascular: Normal rate, regular rhythm and normal heart sounds.  Exam reveals no gallop.   No murmur heard. Pulmonary/Chest: Effort normal and breath sounds normal. No stridor. No respiratory distress. He has no decreased breath sounds. He has no wheezes. He has no rhonchi. He has no rales.  Abdominal: Soft. Normal appearance and bowel sounds are normal. He exhibits no distension. There is tenderness in the epigastric area. There is no rigidity, no rebound, no guarding and no CVA tenderness.    Musculoskeletal: Normal range of motion. He exhibits no edema and no tenderness.  Neurological: He is alert and oriented to person, place, and time. He has normal strength. No cranial nerve deficit or sensory deficit. GCS eye subscore is 4. GCS verbal subscore is 5. GCS motor subscore is 6.  Skin: Skin is warm and dry. No abrasion and no rash noted.  Psychiatric: He has a normal mood and affect. His speech is normal and behavior is normal.    ED Course  Procedures (including critical care time) Labs Review Labs Reviewed  CBC - Abnormal; Notable for the following:    HCT 38.7 (*)    MCV 70.7 (*)    MCH 23.9 (*)  All other components within normal limits  BASIC METABOLIC PANEL  POCT I-STAT TROPONIN I   Imaging Review No results found.  EKG Interpretation     Ventricular Rate:  94 PR Interval:  168 QRS Duration: 88 QT Interval:  336 QTC Calculation: 420 R Axis:   41 Text Interpretation:  Normal sinus rhythm Normal ECG            MDM  No diagnosis found. Pt given meds for gerd and feels better--no concern for acs or pe, will place on ppi and carafte and d/c    Toy Baker, MD 05/28/13  0117

## 2013-05-27 NOTE — ED Notes (Signed)
Pt reports that today is the first day in 2 days since he has able to have a BM but it was small, Pt is feeling nauseated

## 2013-05-28 ENCOUNTER — Emergency Department (HOSPITAL_COMMUNITY): Payer: PRIVATE HEALTH INSURANCE

## 2013-05-28 LAB — HEPATIC FUNCTION PANEL
ALT: 36 U/L (ref 0–53)
Alkaline Phosphatase: 48 U/L (ref 39–117)
Bilirubin, Direct: <0.1 mg/dL (ref 0.0–0.3)

## 2013-05-28 LAB — LIPASE, BLOOD: Lipase: 20 U/L (ref 11–59)

## 2013-05-28 MED ORDER — OXYCODONE-ACETAMINOPHEN 5-325 MG PO TABS
1.0000 | ORAL_TABLET | Freq: Once | ORAL | Status: AC
  Administered 2013-05-28: 1 via ORAL
  Filled 2013-05-28: qty 1

## 2013-05-28 MED ORDER — SUCRALFATE 1 G PO TABS: 1.0000 g | ORAL_TABLET | Freq: Four times a day (QID) | ORAL | Status: DC

## 2013-05-28 MED ORDER — PANTOPRAZOLE SODIUM 20 MG PO TBEC: 20.0000 mg | DELAYED_RELEASE_TABLET | Freq: Every day | ORAL | Status: DC

## 2013-10-10 DIAGNOSIS — I314 Cardiac tamponade: Secondary | ICD-10-CM

## 2013-10-10 HISTORY — DX: Cardiac tamponade: I31.4

## 2013-10-16 ENCOUNTER — Emergency Department (INDEPENDENT_AMBULATORY_CARE_PROVIDER_SITE_OTHER): Payer: Self-pay

## 2013-10-16 ENCOUNTER — Emergency Department (INDEPENDENT_AMBULATORY_CARE_PROVIDER_SITE_OTHER)
Admission: EM | Admit: 2013-10-16 | Discharge: 2013-10-16 | Disposition: A | Discharge status: Home / Self Care | Payer: Self-pay | Source: Home / Self Care | Attending: Family Medicine | Admitting: Family Medicine

## 2013-10-16 ENCOUNTER — Encounter (HOSPITAL_COMMUNITY): Payer: Self-pay | Admitting: Emergency Medicine

## 2013-10-16 DIAGNOSIS — M7520 Bicipital tendinitis, unspecified shoulder: Secondary | ICD-10-CM

## 2013-10-16 DIAGNOSIS — M7522 Bicipital tendinitis, left shoulder: Secondary | ICD-10-CM

## 2013-10-16 MED ORDER — DICLOFENAC SODIUM 1 % TD GEL: 4.0000 g | Freq: Four times a day (QID) | TRANSDERMAL | Status: DC

## 2013-10-16 NOTE — ED Provider Notes (Signed)
CSN: [id number]     Arrival date & time 10/16/13  1646 History   First MD Initiated Contact with Patient 10/16/13 1828     Chief Complaint  Patient presents with  . Shoulder Pain   (Consider location/radiation/quality/duration/timing/severity/associated sxs/prior Treatment) Patient is a 33 y.o. male presenting with shoulder pain. The history is provided by the patient.  Shoulder Pain This is a new problem. The current episode started more than 1 week ago (2 weeks of sx, NKI). The problem has not changed since onset.Pertinent negatives include no chest pain and no abdominal pain.    History reviewed. No pertinent past medical history. History reviewed. No pertinent past surgical history. No family history on file. History  Substance Use Topics  . Smoking status: Never Smoker   . Smokeless tobacco: Not on file  . Alcohol Use: No    Review of Systems  Constitutional: Negative.   Cardiovascular: Negative for chest pain.  Gastrointestinal: Negative for abdominal pain.  Musculoskeletal: Negative for joint swelling.  Skin: Negative.     Allergies  Review of patient's allergies indicates no known allergies.  Home Medications   Current Outpatient Rx  Name  Route  Sig  Dispense  Refill  . bismuth subsalicylate (PEPTO BISMOL) 262 MG chewable tablet   Oral   Chew 1,572 mg by mouth daily as needed for indigestion.         . diclofenac sodium (VOLTAREN) 1 % GEL   Topical   Apply 4 g topically 4 (four) times daily. Please instruct in dosing   3 Tube   1   . ibuprofen (ADVIL,MOTRIN) 800 MG tablet   Oral   Take 800 mg by mouth 2 (two) times daily.         . pantoprazole (PROTONIX) 20 MG tablet   Oral   Take 1 tablet (20 mg total) by mouth daily.   30 tablet   0   . sucralfate (CARAFATE) 1 G tablet   Oral   Take 1 tablet (1 g total) by mouth 4 (four) times daily.   30 tablet   0    BP 139/82  Pulse 100  Temp(Src) 100.1 F (37.8 C) (Oral)  Resp 18  SpO2  100% Physical Exam  Nursing note and vitals reviewed. Constitutional: He is oriented to person, place, and time. He appears well-developed and well-nourished. No distress.  Pulmonary/Chest: Effort normal and breath sounds normal.  Musculoskeletal: He exhibits tenderness.       Left shoulder: He exhibits normal range of motion, no tenderness, no swelling, no crepitus, normal pulse and normal strength.  Neurological: He is alert and oriented to person, place, and time.  Skin: Skin is warm and dry.    ED Course  Procedures (including critical care time) Labs Review Labs Reviewed - No data to display Imaging Review Dg Shoulder Left  10/16/2013   CLINICAL DATA:  Two 3 week history of shoulder pain without history of trauma  EXAM: LEFT SHOULDER - 2+ VIEW  COMPARISON:  None.  FINDINGS: Three views of the left shoulder reveal the bones to be adequately mineralized. There is no evidence of an acute fracture nor dislocation. No significant bony degenerative change is demonstrated. The observed portions of the left clavicle and upper left ribs appear normal.  IMPRESSION: There is no acute bony abnormality of the left shoulder.   Electronically Signed   By: David  [place]   On: 10/16/2013 19:01   X-rays reviewed and report per radiologist.  MDM   1. Bicipital tendonitis of left shoulder        Linna HoffJames D Nicolas Sisler, MD 10/16/13 [phone]

## 2013-10-16 NOTE — Discharge Instructions (Signed)
Ice pack to shoulder for 15 minutes after applying cream to sore area 4 times a day. See orthopedist for further therapy or injection if further problems.

## 2013-10-16 NOTE — ED Notes (Signed)
Patient complains of left shoulder pain for past two weeks Having some discomfort to left side of chest Denies any injury

## 2013-11-01 ENCOUNTER — Emergency Department (HOSPITAL_COMMUNITY): Payer: PRIVATE HEALTH INSURANCE

## 2013-11-01 ENCOUNTER — Encounter (HOSPITAL_COMMUNITY): Payer: Self-pay | Admitting: Emergency Medicine

## 2013-11-01 ENCOUNTER — Inpatient Hospital Stay (HOSPITAL_COMMUNITY)
Admission: EM | Admit: 2013-11-01 | Discharge: 2013-11-04 | DRG: 315 | Disposition: A | Discharge status: Home / Self Care | Payer: Self-pay | Attending: Cardiology | Admitting: Cardiology

## 2013-11-01 DIAGNOSIS — J90 Pleural effusion, not elsewhere classified: Secondary | ICD-10-CM | POA: Diagnosis present

## 2013-11-01 DIAGNOSIS — Z23 Encounter for immunization: Secondary | ICD-10-CM

## 2013-11-01 DIAGNOSIS — I7789 Other specified disorders of arteries and arterioles: Secondary | ICD-10-CM | POA: Diagnosis present

## 2013-11-01 DIAGNOSIS — I309 Acute pericarditis, unspecified: Principal | ICD-10-CM | POA: Diagnosis present

## 2013-11-01 DIAGNOSIS — Z6841 Body Mass Index (BMI) 40.0 and over, adult: Secondary | ICD-10-CM

## 2013-11-01 DIAGNOSIS — I10 Essential (primary) hypertension: Secondary | ICD-10-CM | POA: Diagnosis present

## 2013-11-01 DIAGNOSIS — I313 Pericardial effusion (noninflammatory): Secondary | ICD-10-CM

## 2013-11-01 DIAGNOSIS — D509 Iron deficiency anemia, unspecified: Secondary | ICD-10-CM | POA: Diagnosis present

## 2013-11-01 DIAGNOSIS — D696 Thrombocytopenia, unspecified: Secondary | ICD-10-CM | POA: Diagnosis present

## 2013-11-01 DIAGNOSIS — K219 Gastro-esophageal reflux disease without esophagitis: Secondary | ICD-10-CM | POA: Diagnosis present

## 2013-11-01 DIAGNOSIS — I3139 Other pericardial effusion (noninflammatory): Secondary | ICD-10-CM

## 2013-11-01 DIAGNOSIS — I314 Cardiac tamponade: Secondary | ICD-10-CM | POA: Diagnosis present

## 2013-11-01 HISTORY — DX: Heart disease, unspecified: I51.9

## 2013-11-01 HISTORY — DX: Gastro-esophageal reflux disease without esophagitis: K21.9

## 2013-11-01 HISTORY — DX: Other diseases of pulmonary vessels: I28.8

## 2013-11-01 HISTORY — DX: Essential (primary) hypertension: I10

## 2013-11-01 HISTORY — DX: Morbid (severe) obesity due to excess calories: E66.01

## 2013-11-01 HISTORY — DX: Disease of pericardium, unspecified: I31.9

## 2013-11-01 HISTORY — DX: Cardiac tamponade: I31.4

## 2013-11-01 LAB — URINALYSIS, ROUTINE W REFLEX MICROSCOPIC
Glucose, UA: NEGATIVE mg/dL
Hgb urine dipstick: NEGATIVE
Ketones, ur: >80 mg/dL — AB
LEUKOCYTES UA: NEGATIVE
Nitrite: NEGATIVE
Protein, ur: NEGATIVE mg/dL
SPECIFIC GRAVITY, URINE: 1.028 (ref 1.005–1.030)
UROBILINOGEN UA: 4 mg/dL — AB (ref 0.0–1.0)
pH: 6 (ref 5.0–8.0)

## 2013-11-01 LAB — BASIC METABOLIC PANEL
BUN: 11 mg/dL (ref 6–23)
CHLORIDE: 99 meq/L (ref 96–112)
CO2: 27 meq/L (ref 19–32)
Calcium: 9.2 mg/dL (ref 8.4–10.5)
Creatinine, Ser: 0.92 mg/dL (ref 0.50–1.35)
GFR calc Af Amer: >90 mL/min (ref >90)
GFR calc non Af Amer: >90 mL/min (ref >90)
Glucose, Bld: 102 mg/dL — ABNORMAL HIGH (ref 70–99)
Potassium: 4.3 mEq/L (ref 3.7–5.3)
Sodium: 138 mEq/L (ref 137–147)

## 2013-11-01 LAB — CBC
HCT: 33.9 % — ABNORMAL LOW (ref 39.0–52.0)
Hemoglobin: 11.2 g/dL — ABNORMAL LOW (ref 13.0–17.0)
MCH: 22.7 pg — ABNORMAL LOW (ref 26.0–34.0)
MCHC: 33 g/dL (ref 30.0–36.0)
MCV: 68.6 fL — ABNORMAL LOW (ref 78.0–100.0)
Platelets: 435 10*3/uL — ABNORMAL HIGH (ref 150–400)
RBC: 4.94 MIL/uL (ref 4.22–5.81)
RDW: 14 % (ref 11.5–15.5)
WBC: 10.8 10*3/uL — AB (ref 4.0–10.5)

## 2013-11-01 LAB — I-STAT TROPONIN, ED: TROPONIN I, POC: 0 ng/mL (ref 0.00–0.08)

## 2013-11-01 MED ORDER — DIPHENHYDRAMINE HCL 50 MG/ML IJ SOLN
25.0000 mg | Freq: Once | INTRAMUSCULAR | Status: AC
  Administered 2013-11-01: 25 mg via INTRAVENOUS
  Filled 2013-11-01: qty 1

## 2013-11-01 MED ORDER — METOCLOPRAMIDE HCL 5 MG/ML IJ SOLN
10.0000 mg | Freq: Once | INTRAMUSCULAR | Status: AC
  Administered 2013-11-01: 10 mg via INTRAVENOUS
  Filled 2013-11-01: qty 2

## 2013-11-01 MED ORDER — IOHEXOL 350 MG/ML SOLN
100.0000 mL | Freq: Once | INTRAVENOUS | Status: AC | PRN
  Administered 2013-11-01: 100 mL via INTRAVENOUS

## 2013-11-01 MED ORDER — SODIUM CHLORIDE 0.9 % IV BOLUS (SEPSIS)
1000.0000 mL | Freq: Once | INTRAVENOUS | Status: AC
  Administered 2013-11-01: 1000 mL via INTRAVENOUS

## 2013-11-01 MED ORDER — SODIUM CHLORIDE 0.9 % IV SOLN
INTRAVENOUS | Status: DC
  Administered 2013-11-01 – 2013-11-03 (×3): via INTRAVENOUS

## 2013-11-01 MED ORDER — ACETAMINOPHEN 325 MG PO TABS
650.0000 mg | ORAL_TABLET | Freq: Once | ORAL | Status: AC
  Administered 2013-11-01: 650 mg via ORAL
  Filled 2013-11-01: qty 2

## 2013-11-01 MED ORDER — ASPIRIN 325 MG PO TABS
325.0000 mg | ORAL_TABLET | ORAL | Status: AC
  Administered 2013-11-01: 325 mg via ORAL
  Filled 2013-11-01: qty 1

## 2013-11-01 NOTE — ED Notes (Signed)
Dr. Freida BusmanAllen made aware of pt. pain

## 2013-11-01 NOTE — ED Notes (Signed)
Pt sts he has also been having cp especially with exertion and is having chest pressure right now

## 2013-11-01 NOTE — ED Provider Notes (Signed)
CSN: [id number]     Arrival date & time 11/01/13  1651 History   First MD Initiated Contact with Patient 11/01/13 2035     Chief Complaint  Patient presents with  . Shoulder Pain  . Urinary Tract Infection  . Chest Pain     (Consider location/radiation/quality/duration/timing/severity/associated sxs/prior Treatment) Patient is a 33 y.o. male presenting with shoulder pain, urinary tract infection, and chest pain. The history is provided by the patient.  Shoulder Pain Associated symptoms include chest pain.  Urinary Tract Infection Associated symptoms include chest pain.  Chest Pain  patient complaining of Dysuria without penile drainage or discharge. He has had dark colored urine and left-sided flank pain and was concern that this could be a urinary tract infection. He also notes mid sternal chest discomfort that is better with sitting up forward and worse with lying flat. Discomfort has been persistent. He has also been exertional. Denies any cough or fever. No syncope or near-syncope. No prior history of same. Symptoms better with remaining still and worse with walking. No leg pain or swelling. No vomiting or diarrhea. Denies any palpitations.  History reviewed. No pertinent past medical history. History reviewed. No pertinent past surgical history. No family history on file. History  Substance Use Topics  . Smoking status: Never Smoker   . Smokeless tobacco: Not on file  . Alcohol Use: No    Review of Systems  Cardiovascular: Positive for chest pain.  All other systems reviewed and are negative.      Allergies  Review of patient's allergies indicates no known allergies.  Home Medications   Current Outpatient Rx  Name  Route  Sig  Dispense  Refill  . acetaminophen (TYLENOL) 500 MG tablet   Oral   Take 500 mg by mouth every 6 (six) hours as needed.         . diclofenac sodium (VOLTAREN) 1 % GEL   Topical   Apply 4 g topically 4 (four) times daily. Please instruct  in dosing   3 Tube   1   . ibuprofen (ADVIL,MOTRIN) 800 MG tablet   Oral   Take 800 mg by mouth 2 (two) times daily.          BP 121/63  Pulse 102  Temp(Src) 100.4 F (38 C) (Oral)  Resp 15  Ht 5\' 10"  (1.778 m)  Wt 380 lb (172.367 kg)  BMI 54.52 kg/m2  SpO2 99% Physical Exam  Nursing note and vitals reviewed. Constitutional: He is oriented to person, place, and time. He appears well-developed and well-nourished.  Non-toxic appearance. No distress.  HENT:  Head: Normocephalic and atraumatic.  Eyes: Conjunctivae, EOM and lids are normal. Pupils are equal, round, and reactive to light.  Neck: Normal range of motion. Neck supple. No tracheal deviation present. No mass present.  Cardiovascular: Normal rate, regular rhythm and normal heart sounds.  Exam reveals no gallop.   No murmur heard. Pulmonary/Chest: Effort normal and breath sounds normal. No stridor. No respiratory distress. He has no decreased breath sounds. He has no wheezes. He has no rhonchi. He has no rales.  Abdominal: Soft. Normal appearance and bowel sounds are normal. He exhibits no distension. There is no tenderness. There is no rebound and no CVA tenderness.  Musculoskeletal: Normal range of motion. He exhibits no edema and no tenderness.  Neurological: He is alert and oriented to person, place, and time. He has normal strength. No cranial nerve deficit or sensory deficit. GCS eye subscore is 4. GCS  verbal subscore is 5. GCS motor subscore is 6.  Skin: Skin is warm and dry. No abrasion and no rash noted.  Psychiatric: He has a normal mood and affect. His speech is normal and behavior is normal.    ED Course  Procedures (including critical care time) Labs Review Labs Reviewed  CBC - Abnormal; Notable for the following:    WBC 10.8 (*)    Hemoglobin 11.2 (*)    HCT 33.9 (*)    MCV 68.6 (*)    MCH 22.7 (*)    Platelets 435 (*)    All other components within normal limits  BASIC METABOLIC PANEL - Abnormal;  Notable for the following:    Glucose, Bld 102 (*)    All other components within normal limits  URINALYSIS, ROUTINE W REFLEX MICROSCOPIC - Abnormal; Notable for the following:    Color, Urine AMBER (*)    APPearance CLOUDY (*)    Bilirubin Urine SMALL (*)    Ketones, ur >80 (*)    Urobilinogen, UA 4.0 (*)    All other components within normal limits  Rosezena SensorI-STAT TROPOININ, ED   Imaging Review Dg Chest 2 View  11/01/2013   CLINICAL DATA:  33 year old male with shortness of breath and weakness.  EXAM: CHEST  2 VIEW  COMPARISON:  05/28/2013  FINDINGS: Enlargement of the cardiopericardial silhouette has increased since the prior study and a pericardial effusion is not excluded.  There is no evidence of focal airspace disease, pulmonary edema, suspicious pulmonary nodule/mass, pleural effusion, or pneumothorax. No acute bony abnormalities are identified.  IMPRESSION: Interval enlargement of the cardiopericardial silhouette - pericardial effusion is not excluded.  No other significant abnormalities noted.   Electronically Signed   By: Laveda AbbeJeff  Hu M.D.   On: 11/01/2013 19:21     EKG Interpretation   Date/Time:  Monday November 01 2013 17:27:40 EDT Ventricular Rate:  104 PR Interval:  160 QRS Duration: 88 QT Interval:  300 QTC Calculation: 394 R Axis:   42 Text Interpretation:  Sinus tachycardia Nonspecific ST and T wave  abnormality Abnormal ECG Confirmed by Freida BusmanALLEN  MD, Zamyra Allensworth ([id number]) on  11/01/2013 8:36:12 PM      MDM   Final diagnoses:  None    Pt given tylenol and meds for his HA--large peri-cardial effusion noted, spoke with cards, dr. Jearld PiesMcclean, will admit pt    Toy BakerAnthony T Sadey Yandell, MD 11/02/13 [phone]

## 2013-11-01 NOTE — ED Notes (Signed)
Patient back from CT scan at this time. Preparing to move to Pod C, while waiting for Dispo status.

## 2013-11-01 NOTE — ED Notes (Signed)
Caleb [name] from CT called sts appx 2 people ahead of patient. Pt updated.

## 2013-11-01 NOTE — ED Notes (Signed)
Pt reports he went to urgent care 2 weeks ago and was dx with tendonitis on left shoulder and tired the cream they prescribed him but it hasn't help much. Then a few days later he noticed some pain to left lateral abd, tried some OTC meds, no relief. Noticed dark colored urine and tried drinking some extra water. Denies dysuria at this moment but sts that he has had it the first couple of days. sts he has been having some urinary frequency. Nad, skin warm and dry, rsep e/u.

## 2013-11-02 ENCOUNTER — Encounter (HOSPITAL_COMMUNITY)
Admission: EM | Disposition: A | Discharge status: Home / Self Care | Payer: Self-pay | Source: Home / Self Care | Attending: Cardiology

## 2013-11-02 DIAGNOSIS — I319 Disease of pericardium, unspecified: Secondary | ICD-10-CM

## 2013-11-02 DIAGNOSIS — I309 Acute pericarditis, unspecified: Principal | ICD-10-CM

## 2013-11-02 DIAGNOSIS — I314 Cardiac tamponade: Secondary | ICD-10-CM

## 2013-11-02 HISTORY — PX: PERICARDIAL TAP: SHX5486

## 2013-11-02 LAB — BASIC METABOLIC PANEL
BUN: 9 mg/dL (ref 6–23)
CHLORIDE: 99 meq/L (ref 96–112)
CO2: 27 meq/L (ref 19–32)
Calcium: 8.6 mg/dL (ref 8.4–10.5)
Creatinine, Ser: 0.96 mg/dL (ref 0.50–1.35)
GFR calc Af Amer: >90 mL/min (ref >90)
GFR calc non Af Amer: >90 mL/min (ref >90)
Glucose, Bld: 118 mg/dL — ABNORMAL HIGH (ref 70–99)
Potassium: 4.1 mEq/L (ref 3.7–5.3)
Sodium: 137 mEq/L (ref 137–147)

## 2013-11-02 LAB — BODY FLUID CELL COUNT WITH DIFFERENTIAL
EOS FL: 0 %
Lymphs, Fluid: 73 %
Monocyte-Macrophage-Serous Fluid: 6 % — ABNORMAL LOW (ref 50–90)
Neutrophil Count, Fluid: 21 % (ref 0–25)
WBC FLUID: 8957 uL — AB (ref 0–1000)

## 2013-11-02 LAB — CREATININE, FLUID (PLEURAL, PERITONEAL, JP DRAINAGE): Creat, Fluid: 0.8 mg/dL

## 2013-11-02 LAB — GLUCOSE, SEROUS FLUID: Glucose, Fluid: 72 mg/dL

## 2013-11-02 LAB — TSH: TSH: 1.957 u[IU]/mL (ref 0.350–4.500)

## 2013-11-02 LAB — C-REACTIVE PROTEIN: CRP: 23.2 mg/dL — AB (ref <0.60)

## 2013-11-02 LAB — LACTATE DEHYDROGENASE, PLEURAL OR PERITONEAL FLUID: LD FL: 2206 U/L — AB (ref 3–23)

## 2013-11-02 LAB — MRSA PCR SCREENING: MRSA BY PCR: NEGATIVE

## 2013-11-02 LAB — SEDIMENTATION RATE: Sed Rate: 50 mm/hr — ABNORMAL HIGH (ref 0–16)

## 2013-11-02 SURGERY — PERICARDIAL TAP
Anesthesia: LOCAL

## 2013-11-02 MED ORDER — INFLUENZA VAC SPLIT QUAD 0.5 ML IM SUSP
0.5000 mL | INTRAMUSCULAR | Status: AC
  Administered 2013-11-04: 0.5 mL via INTRAMUSCULAR
  Filled 2013-11-02 (×2): qty 0.5

## 2013-11-02 MED ORDER — POLYETHYLENE GLYCOL 3350 17 G PO PACK
17.0000 g | PACK | Freq: Every day | ORAL | Status: DC
Start: 2013-11-02 — End: 2013-11-04
  Administered 2013-11-02 – 2013-11-03 (×2): 17 g via ORAL
  Filled 2013-11-02 (×3): qty 1

## 2013-11-02 MED ORDER — FENTANYL CITRATE 0.05 MG/ML IJ SOLN
INTRAMUSCULAR | Status: AC
  Filled 2013-11-02: qty 2

## 2013-11-02 MED ORDER — COLCHICINE 0.6 MG PO TABS
0.6000 mg | ORAL_TABLET | Freq: Two times a day (BID) | ORAL | Status: DC
  Administered 2013-11-02 – 2013-11-04 (×6): 0.6 mg via ORAL
  Filled 2013-11-02 (×8): qty 1

## 2013-11-02 MED ORDER — HEPARIN (PORCINE) IN NACL 2-0.9 UNIT/ML-% IJ SOLN
INTRAMUSCULAR | Status: AC
  Filled 2013-11-02: qty 1000

## 2013-11-02 MED ORDER — MIDAZOLAM HCL 2 MG/2ML IJ SOLN
INTRAMUSCULAR | Status: AC
  Filled 2013-11-02: qty 2

## 2013-11-02 MED ORDER — PANTOPRAZOLE SODIUM 40 MG PO TBEC
40.0000 mg | DELAYED_RELEASE_TABLET | Freq: Every day | ORAL | Status: DC
  Administered 2013-11-02 – 2013-11-04 (×3): 40 mg via ORAL
  Filled 2013-11-02 (×3): qty 1

## 2013-11-02 MED ORDER — HYDROCODONE-ACETAMINOPHEN 5-325 MG PO TABS
2.0000 | ORAL_TABLET | ORAL | Status: DC | PRN
Start: 2013-11-02 — End: 2013-11-04
  Administered 2013-11-02 – 2013-11-03 (×3): 2 via ORAL
  Filled 2013-11-02 (×4): qty 2

## 2013-11-02 MED ORDER — LIDOCAINE HCL (PF) 1 % IJ SOLN
INTRAMUSCULAR | Status: AC
  Filled 2013-11-02: qty 30

## 2013-11-02 MED ORDER — ZOLPIDEM TARTRATE 5 MG PO TABS
5.0000 mg | ORAL_TABLET | Freq: Every evening | ORAL | Status: DC | PRN
  Administered 2013-11-02 – 2013-11-03 (×2): 5 mg via ORAL
  Filled 2013-11-02 (×2): qty 1

## 2013-11-02 MED ORDER — IBUPROFEN 800 MG PO TABS
800.0000 mg | ORAL_TABLET | Freq: Three times a day (TID) | ORAL | Status: DC
  Administered 2013-11-02 – 2013-11-04 (×7): 800 mg via ORAL
  Filled 2013-11-02 (×10): qty 1

## 2013-11-02 NOTE — H&P (View-Only) (Signed)
Patient Name: Caleb [name]. Date of Encounter: 11/02/2013  Active Problems:   Acute pericarditis   Length of Stay: 1  SUBJECTIVE  Orthopneic. Mild pleuritic retrosternal pain, intensified by lying flat. Afebrile. Not dizzy. BP relatively, but lower than yesterday, not tachycardic  CRP 23, ESR 50 Echo c/w near tamponade  CURRENT MEDS . colchicine  0.6 mg Oral BID  . ibuprofen  800 mg Oral Q8H  . pantoprazole  40 mg Oral Q1200  . polyethylene glycol  17 g Oral Daily    OBJECTIVE   Intake/Output Summary (Last 24 hours) at 11/02/13 1343 Last data filed at 11/02/13 0600  Gross per 24 hour  Intake    625 ml  Output      0 ml  Net    625 ml   Filed Weights   11/01/13 1708  Weight: 172.367 kg (380 lb)    PHYSICAL EXAM Filed Vitals:   11/02/13 0400 11/02/13 0700 11/02/13 0800 11/02/13 1208  BP: 151/90 151/111 153/106 160/111  Pulse:      Temp: 99 F (37.2 C) 98 F (36.7 C)  98 F (36.7 C)  TempSrc: Oral Oral  Oral  Resp:  20  20  Height:      Weight:      SpO2:  99%  99%   General: Alert, oriented x3, no distress, morbidly obese Head: no evidence of trauma, PERRL, EOMI, no exophtalmos or lid lag, no myxedema, no xanthelasma; normal ears, nose and oropharynx Neck: cannot see jugular venous pulsations ; brisk carotid pulses without delay and no carotid bruits; acanthosis nigricans present Chest: clear to auscultation, no signs of consolidation by percussion or palpation, normal fremitus, symmetrical and full respiratory excursions Cardiovascular: normal position and quality of the apical impulse, regular rhythm, normal first and second heart sounds, no rubs or gallops, no murmur Abdomen: no tenderness or distention, no masses by palpation, no abnormal pulsatility or arterial bruits, normal bowel sounds, no hepatosplenomegaly Extremities: no clubbing, cyanosis or edema; 2+ radial, ulnar and brachial pulses bilaterally; 2+ right femoral, posterior tibial and  dorsalis pedis pulses; 2+ left femoral, posterior tibial and dorsalis pedis pulses; no subclavian or femoral bruits Neurological: grossly nonfocal  LABS  CBC  Recent Labs  11/01/13 1730  WBC 10.8*  HGB 11.2*  HCT 33.9*  MCV 68.6*  PLT 937*   Basic Metabolic Panel  Recent Labs  11/01/13 1730 11/02/13 0635  NA 138 137  K 4.3 4.1  CL 99 99  CO2 27 27  GLUCOSE 102* 118*  BUN 11 9  CREATININE 0.92 0.96  CALCIUM 9.2 8.6    Recent Labs  11/02/13 0045  TSH 1.957    Radiology Studies Imaging results have been reviewed and Dg Chest 2 View  11/01/2013   CLINICAL DATA:  33 year old male with shortness of breath and weakness.  EXAM: CHEST  2 VIEW  COMPARISON:  05/28/2013  FINDINGS: Enlargement of the cardiopericardial silhouette has increased since the prior study and a pericardial effusion is not excluded.  There is no evidence of focal airspace disease, pulmonary edema, suspicious pulmonary nodule/mass, pleural effusion, or pneumothorax. No acute bony abnormalities are identified.  IMPRESSION: Interval enlargement of the cardiopericardial silhouette - pericardial effusion is not excluded.  No other significant abnormalities noted.   Electronically Signed   By: Hassan Rowan M.D.   On: 11/01/2013 19:21   Ct Angio Chest Pe W/cm &/or Wo Cm  11/01/2013   CLINICAL DATA:  Midsternal chest pain with  shortness of breath.  EXAM: CT ANGIOGRAPHY CHEST WITH CONTRAST  TECHNIQUE: Multidetector CT imaging of the chest was performed using the standard protocol during bolus administration of intravenous contrast. Multiplanar CT image reconstructions and MIPs were obtained to evaluate the vascular anatomy.  CONTRAST:  153m OMNIPAQUE IOHEXOL 350 MG/ML SOLN  COMPARISON:  DG CHEST 2 VIEW dated 11/01/2013  FINDINGS: No pulmonary arterial filling defects are identified to suggest pulmonary emboli. The main pulmonary artery is mildly enlarged, measuring approximately 3.2 cm in diameter. There is a moderately  large pericardial effusion. Prevascular lymph nodes measure up to 11 mm in short axis. Precarinal lymph node measures 1.5 cm. There is a small left pleural effusion. Dependent opacities in the left greater than right lung bases likely represent atelectasis.  The visualized portion the upper abdomen is unremarkable. The osseous structures are unremarkable.  Review of the MIP images confirms the above findings.  IMPRESSION: 1. No evidence of pulmonary emboli. 2. Moderately large pericardial effusion. 3. Mildly enlarged mediastinal lymph nodes, most likely reactive. 4. Small left pleural effusion. 5. Mild enlargement of the main pulmonary artery, nonspecific but can be seen in the setting of pulmonary arterial hypertension.  Items 1 and 2 were called by telephone at the time of interpretation on 11/01/2013 at 11:53 PM to Dr. ALacretia Leigh, who verbally acknowledged these results.   Electronically Signed   By: ALogan Bores  On: 11/01/2013 23:58    TELE No arrhythmia  ECG NSR, no ST changes  ECHO A moderate, free-flowingpericardial effusion was identified circumferential to theheart. The fluid had no internal echoes. There was milddiastolic RV inversion which appeared to change with respiration as well as dilated IVC with blunted respiratory variation that is suggestive of elevated intrapericardial pressure. As well, there was marked mitral valve and tricuspid valve inflow Doppler respiratory variation.  Effusion measures 1.63cm along posterior wall, 2.88cm along basal RV free wall, 1.37cm lateral wall   ASSESSMENT AND PLAN Symptomatic large pericardial effusion and symptoms of acute pericarditis. Hemodynamically stable, but with echo features strongly suggestive of near tamponade. Recommend therapeutic and diagnostic pericardiocentesis. This procedure has been fully reviewed with the patient and written informed consent has been obtained. Tentatively scheduled around 3 PM with Dr. TJonny Ruiz MD, [facility] ([phone] 11/02/2013 1:43 PM

## 2013-11-02 NOTE — Progress Notes (Signed)
Patient Name: Caleb [name]. Date of Encounter: 11/02/2013  Active Problems:   Acute pericarditis   Length of Stay: 1  SUBJECTIVE  Orthopneic. Mild pleuritic retrosternal pain, intensified by lying flat. Afebrile. Not dizzy. BP relatively, but lower than yesterday, not tachycardic  CRP 23, ESR 50 Echo c/w near tamponade  CURRENT MEDS . colchicine  0.6 mg Oral BID  . ibuprofen  800 mg Oral Q8H  . pantoprazole  40 mg Oral Q1200  . polyethylene glycol  17 g Oral Daily    OBJECTIVE   Intake/Output Summary (Last 24 hours) at 11/02/13 1343 Last data filed at 11/02/13 0600  Gross per 24 hour  Intake    625 ml  Output      0 ml  Net    625 ml   Filed Weights   11/01/13 1708  Weight: 172.367 kg (380 lb)    PHYSICAL EXAM Filed Vitals:   11/02/13 0400 11/02/13 0700 11/02/13 0800 11/02/13 1208  BP: 151/90 151/111 153/106 160/111  Pulse:      Temp: 99 F (37.2 C) 98 F (36.7 C)  98 F (36.7 C)  TempSrc: Oral Oral  Oral  Resp:  20  20  Height:      Weight:      SpO2:  99%  99%   General: Alert, oriented x3, no distress, morbidly obese Head: no evidence of trauma, PERRL, EOMI, no exophtalmos or lid lag, no myxedema, no xanthelasma; normal ears, nose and oropharynx Neck: cannot see jugular venous pulsations ; brisk carotid pulses without delay and no carotid bruits; acanthosis nigricans present Chest: clear to auscultation, no signs of consolidation by percussion or palpation, normal fremitus, symmetrical and full respiratory excursions Cardiovascular: normal position and quality of the apical impulse, regular rhythm, normal first and second heart sounds, no rubs or gallops, no murmur Abdomen: no tenderness or distention, no masses by palpation, no abnormal pulsatility or arterial bruits, normal bowel sounds, no hepatosplenomegaly Extremities: no clubbing, cyanosis or edema; 2+ radial, ulnar and brachial pulses bilaterally; 2+ right femoral, posterior tibial and  dorsalis pedis pulses; 2+ left femoral, posterior tibial and dorsalis pedis pulses; no subclavian or femoral bruits Neurological: grossly nonfocal  LABS  CBC  Recent Labs  11/01/13 1730  WBC 10.8*  HGB 11.2*  HCT 33.9*  MCV 68.6*  PLT 387*   Basic Metabolic Panel  Recent Labs  11/01/13 1730 11/02/13 0635  NA 138 137  K 4.3 4.1  CL 99 99  CO2 27 27  GLUCOSE 102* 118*  BUN 11 9  CREATININE 0.92 0.96  CALCIUM 9.2 8.6    Recent Labs  11/02/13 0045  TSH 1.957    Radiology Studies Imaging results have been reviewed and Dg Chest 2 View  11/01/2013   CLINICAL DATA:  33 year old male with shortness of breath and weakness.  EXAM: CHEST  2 VIEW  COMPARISON:  05/28/2013  FINDINGS: Enlargement of the cardiopericardial silhouette has increased since the prior study and a pericardial effusion is not excluded.  There is no evidence of focal airspace disease, pulmonary edema, suspicious pulmonary nodule/mass, pleural effusion, or pneumothorax. No acute bony abnormalities are identified.  IMPRESSION: Interval enlargement of the cardiopericardial silhouette - pericardial effusion is not excluded.  No other significant abnormalities noted.   Electronically Signed   By: Hassan Rowan M.D.   On: 11/01/2013 19:21   Ct Angio Chest Pe W/cm &/or Wo Cm  11/01/2013   CLINICAL DATA:  Midsternal chest pain with  shortness of breath.  EXAM: CT ANGIOGRAPHY CHEST WITH CONTRAST  TECHNIQUE: Multidetector CT imaging of the chest was performed using the standard protocol during bolus administration of intravenous contrast. Multiplanar CT image reconstructions and MIPs were obtained to evaluate the vascular anatomy.  CONTRAST:  162m OMNIPAQUE IOHEXOL 350 MG/ML SOLN  COMPARISON:  DG CHEST 2 VIEW dated 11/01/2013  FINDINGS: No pulmonary arterial filling defects are identified to suggest pulmonary emboli. The main pulmonary artery is mildly enlarged, measuring approximately 3.2 cm in diameter. There is a moderately  large pericardial effusion. Prevascular lymph nodes measure up to 11 mm in short axis. Precarinal lymph node measures 1.5 cm. There is a small left pleural effusion. Dependent opacities in the left greater than right lung bases likely represent atelectasis.  The visualized portion the upper abdomen is unremarkable. The osseous structures are unremarkable.  Review of the MIP images confirms the above findings.  IMPRESSION: 1. No evidence of pulmonary emboli. 2. Moderately large pericardial effusion. 3. Mildly enlarged mediastinal lymph nodes, most likely reactive. 4. Small left pleural effusion. 5. Mild enlargement of the main pulmonary artery, nonspecific but can be seen in the setting of pulmonary arterial hypertension.  Items 1 and 2 were called by telephone at the time of interpretation on 11/01/2013 at 11:53 PM to Dr. ALacretia Leigh, who verbally acknowledged these results.   Electronically Signed   By: ALogan Bores  On: 11/01/2013 23:58    TELE No arrhythmia  ECG NSR, no ST changes  ECHO A moderate, free-flowingpericardial effusion was identified circumferential to theheart. The fluid had no internal echoes. There was milddiastolic RV inversion which appeared to change with respiration as well as dilated IVC with blunted respiratory variation that is suggestive of elevated intrapericardial pressure. As well, there was marked mitral valve and tricuspid valve inflow Doppler respiratory variation.  Effusion measures 1.63cm along posterior wall, 2.88cm along basal RV free wall, 1.37cm lateral wall   ASSESSMENT AND PLAN Symptomatic large pericardial effusion and symptoms of acute pericarditis. Hemodynamically stable, but with echo features strongly suggestive of near tamponade. Recommend therapeutic and diagnostic pericardiocentesis. This procedure has been fully reviewed with the patient and written informed consent has been obtained. Tentatively scheduled around 3 PM with Dr. TJonny Ruiz MD, [facility] ([phone] [phone] 11/02/2013 1:43 PM

## 2013-11-02 NOTE — H&P (Signed)
Physician History and Physical    Caleb [name]. MRN: [id number] DOB/AGE: June 20, 1981 33 y.o. Admit date: 11/01/2013  Primary Cardiologist: New  HPI: 33 yo with no known cardiac disease presented to ER for evaluation of chest pain and dyspnea.  2 weeks, ago, patient developed central chest pain.  The pain was worse with inspiration and with lying down.  It was better with sitting up.  The chest pain has continued until today.  About a week ago, he additionally developed exertional dyspnea.  He is short of breath walking up steps or inclines or walking a long distance.  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  He finally came to the ER for evaluation today.  Temperature was 101.  He was sent for CTA chest.  No PNA.  He did, however, have a moderate to large pericardial effusion.  No upper respiratory/cold symptoms.  No TB exposures.  He is not lightheaded and blood pressure is stable in the ER.  HR in the 90s to 100s, sinus.   PMH: 1. GERD  Review of systems complete and found to be negative unless listed above   FH: Father with CHF, Aunt with CABG  History   Social History  . Marital Status: Single    Spouse Name: N/A    Number of Children: N/A  . Years of Education: N/A   Occupational History  . Not on file.   Social History Main Topics  . Smoking status: Never Smoker   . Smokeless tobacco: Not on file  . Alcohol Use: No  . Drug Use: No  . Sexual Activity:    Other Topics Concern  . Not on file   Social History Narrative  . No narrative on file     Physical Exam: Blood pressure 114/46, pulse 96, temperature 99.5 F (37.5 C), temperature source Oral, resp. rate 20, height _0  (1.778 m), weight 380 lb (172.367 kg), SpO2 100.00%.  General: NAD, obese Neck: Thick, JVP 10-12 cm, no thyromegaly or thyroid nodule.  Lungs: Clear to auscultation bilaterally with normal respiratory effort. CV: Nondisplaced  PMI.  Heart regular S1/S2, no S3/S4, no murmur, no rub heard.  No peripheral edema.  No carotid bruit.  Normal pedal pulses.  Abdomen: Soft, nontender, no hepatosplenomegaly, no distention.  Skin: Intact without lesions or rashes.  Neurologic: Alert and oriented x 3.  Psych: Normal affect. Extremities: No clubbing or cyanosis.  HEENT: Normal.   Labs:   Lab Results  Component Value Date   WBC 10.8* 11/01/2013   HGB 11.2* 11/01/2013   HCT 33.9* 11/01/2013   MCV 68.6* 11/01/2013   PLT 435* 11/01/2013    Recent Labs Lab 11/01/13 1730  NA 138  K 4.3  CL 99  CO2 27  BUN 11  CREATININE 0.92  CALCIUM 9.2  GLUCOSE 102*  TnI 0    Radiology: - CTA chest: No PE, no PNA.  There was a moderate to large pericardial effusion.   EKG: NSR, inferior/lateral/anterolateral slight ST elevation with PR depression in II and V6 and PR elevation in AVR.   ASSESSMENT AND PLAN:  33 yo with no known cardiac disease presented to ER for evaluation of chest pain and dyspnea.  He appears to have acute pericarditis with development of a pericardial effusion.  1. Acute pericarditis: Chest pain symptoms are suggestive of acute pericarditis.  ECG is also suggestive of pericarditis.  He has a fever to 101.  Reactive lymph nodes noted in mediastinum on chest CT.  No PNA.  Possible viral pericarditis.  No TB exposures.  - I will send labs for CRP, ESR, and TSH.  - Start colchicine and Ibuprofen.  2. Pericardial effusion: Likely triggered by acute pericarditis.  Moderate to large on PE CT.  BP is stable, mildly tachycardic.  There is concern for hemodynamic effect with early tamponade given JVP on exam and increasing exertional dyspnea over the last week.  - He will need an echo to look for tamponade.  If tamponade physiology present, will need pericardiocentesis.  This does not appear to be emergent but may be needed during this hospitalization.   Loralie Champagne 11/02/2013   Signed: Loralie Champagne 11/02/2013, 12:25  AM

## 2013-11-02 NOTE — Interval H&P Note (Signed)
History and Physical Interval Note:  11/02/2013 4:18 PM  Caleb T Junita Pushhandler Jr.  has presented today for surgery, with the diagnosis of Pericardial Effusion  The various methods of treatment have been discussed with the patient and family. After consideration of risks, benefits and other options for treatment, the patient has consented to  Procedure(s): PERICARDIAL TAP (N/A) as a surgical intervention .  The patient's history has been reviewed, patient examined, no change in status, stable for surgery.  I have reviewed the patient's chart and labs.  Questions were answered to the patient's satisfaction.     KELLY,THOMAS A

## 2013-11-02 NOTE — CV Procedure (Signed)
Caleb [name]. is a 33 y.o. male    [id number]  [id number] LOCATION:  FACILITY: [facility]  PHYSICIAN: Troy Sine, MD, [facility] 1981-03-07   DATE OF PROCEDURE:  11/02/2013    PERICARIOCENTESIS    HISTORY: [name] is a 33 year old morbidly obese African American male with acute pericarditis and echocardiographic documentation of a moderately large circumferential pericardial effusion with  Impending cardiac tamponade physiology. He now presents for pericardiocentesis   PROCEDURE:  The patient was brought to the cardiac catheterization laboratory and was placed in supine position on the cardiac catheterization table. His sternal and midepigastric region was prepped and draped in sterile fashion. He initially was treated with Versed 2 mg and fentanyl 25 mcg but became apparent that he needed significantly increased sedation. He did receive several additional doses of fentanyl and Versed for a total dose of 5 mg of Versed and 100 mcg of fentanyl. Due to the patient's severe morbid obesity Dr. Angelena Form also assisted  me with the procedure. Xylocaine was administered in the subxiphoid region and a pericardial needle was able to be advanced successfully to the pericardial space with alligator clamps in place. A wire was advanced into the pericardium and fluoroscopy and a small injection of contrast into the pericardium verified pericardial space. A dilator was then inserted and an angled pericardial catheter was then advanced into the pericardium. A total of 650 cc of hemorrhagic pericardial effusion was removed and labs were sent for LDH, amylase, total protein, creatinine, glucose, pH, cell count with differential, in addition to Gram stain and culture as well as cytology and AFB. The pericardial catheter was then placed on negative close suction kit and sutured in place. The patient tolerated the procedure well. He returned to his room in satisfactory condition.    Troy Sine,  MD, [facility] 11/02/2013 5:33 PM

## 2013-11-02 NOTE — Care Management Note (Addendum)
    Page 1 of 1   11/04/2013     2:58:31 PM   CARE MANAGEMENT NOTE 11/04/2013  Patient:  Maryruth BunCHANDLER,Lajuan T   Account Number:  [id number]  Date Initiated:  11/02/2013  Documentation initiated by:  Junius CreamerWELL,DEBBIE  Subjective/Objective Assessment:   adm w pericarditis     Action/Plan:   lives w fam   Anticipated DC Date:  11/04/2013   Anticipated DC Plan:  HOME/SELF CARE      DC Planning Services  CM consult  [facility]igent [facility]  Medication Assistance  [facility]      Choice offered to / List presented to:             Status of service:  Completed, signed off Medicare Important Message given?   (If response is "NO", the following Medicare IM given date fields will be blank) Date Medicare IM given:   Date Additional Medicare IM given:    Discharge Disposition:  HOME/SELF CARE  Per UR Regulation:  Reviewed for med. necessity/level of care/duration of stay  If discussed at Long Length of Stay Meetings, dates discussed:    Comments:  11/04/13 Rosalita ChessmanJULIE Mandy Peeks,RN,BSN [phone] PT FOR DC HOME TODAY.  PT IS UNINSURED; NEEDS COLCHICINE UPON DC.  PT ELIGIBLE FOR MEDICATION ASSISTANCE THROUGH [facility] MATCH PROGRAM.  MATCH LETTER GIVEN WITH EXPLANATION OF PROGRAM BENEFITS.  PT ALSO GIVEN A TAKEDA PATIENT ASSISTANCE FORM FOR COLCHICINE.  MD SECTION COMPLETED BY PA.  PT NEEDS ONLY TO ATTACH W-2 AND MAIL OR FAX TO DRUG COMPANY.  HE VERBALIZES UNDERSTANDING OF THIS.  HE IS APPRECIATIVE OF HELP GIVEN.   3/24 1143 debbie dowell rn,bsn spoke w pt. no pcp. gave pt resouse list w clinics in [place]. left pt 2 prescription disount cards also.

## 2013-11-02 NOTE — Progress Notes (Signed)
Echocardiogram 2D Echocardiogram has been performed.  Caleb [name] 11/02/2013, 8:51 AM

## 2013-11-03 DIAGNOSIS — I319 Disease of pericardium, unspecified: Secondary | ICD-10-CM

## 2013-11-03 LAB — PH, BODY FLUID: pH, Fluid: 8.5

## 2013-11-03 MED ORDER — AMLODIPINE BESYLATE 5 MG PO TABS
5.0000 mg | ORAL_TABLET | Freq: Every day | ORAL | Status: DC
  Administered 2013-11-03 – 2013-11-04 (×2): 5 mg via ORAL
  Filled 2013-11-03 (×2): qty 1

## 2013-11-03 MED ORDER — HYDROCHLOROTHIAZIDE 25 MG PO TABS
25.0000 mg | ORAL_TABLET | Freq: Every day | ORAL | Status: DC
  Administered 2013-11-03 – 2013-11-04 (×2): 25 mg via ORAL
  Filled 2013-11-03 (×2): qty 1

## 2013-11-03 MED ORDER — MAGNESIUM HYDROXIDE 400 MG/5ML PO SUSP
30.0000 mL | Freq: Once | ORAL | Status: AC
  Administered 2013-11-03: 30 mL via ORAL
  Filled 2013-11-03: qty 30

## 2013-11-03 MED ORDER — AMLODIPINE BESYLATE 5 MG PO TABS
5.0000 mg | ORAL_TABLET | Freq: Every day | ORAL | Status: DC
  Filled 2013-11-03: qty 1

## 2013-11-03 MED ORDER — PREDNISONE 50 MG PO TABS
50.0000 mg | ORAL_TABLET | Freq: Every day | ORAL | Status: DC
  Administered 2013-11-04: 50 mg via ORAL
  Filled 2013-11-03 (×2): qty 1

## 2013-11-03 NOTE — Progress Notes (Signed)
Echo Lab  2D Limited Echocardiogram completed.  Zorianna Taliaferro L Razan Siler, RDCS 11/03/2013 10:35 AM

## 2013-11-03 NOTE — Progress Notes (Signed)
Pericardial cath. withhemovac  removed aseptically by Dr. Royann Shiversroitoru, tolerated well. Pressure dressing applied, continue to monitor.

## 2013-11-03 NOTE — Progress Notes (Signed)
BP still very high. Will keep over night to assess response to therapy.

## 2013-11-03 NOTE — Progress Notes (Signed)
Pt transferred to 2w19 from 2H; VSS; family in room; pt sitting up in chair; will cont. To monitor.

## 2013-11-03 NOTE — Progress Notes (Signed)
Patient Name: Caleb [name]. Date of Encounter: 11/03/2013  Active Problems:   Acute pericarditis   Length of Stay: 2  SUBJECTIVE  Breathing better.  Minimal overnight fluid drainage and very little effusion on f/u echo. I removed the drain. Fluid parameters exudative, not otherwise diagnostic.  CURRENT MEDS . amLODipine  5 mg Oral Daily  . colchicine  0.6 mg Oral BID  . ibuprofen  800 mg Oral Q8H  . influenza vac split quadrivalent PF  0.5 mL Intramuscular Tomorrow-1000  . pantoprazole  40 mg Oral Q1200  . polyethylene glycol  17 g Oral Daily  . [START ON 11/04/2013] predniSONE  50 mg Oral QAC breakfast    OBJECTIVE   Intake/Output Summary (Last 24 hours) at 11/03/13 1348 Last data filed at 11/03/13 1225  Gross per 24 hour  Intake   4210 ml  Output   1975 ml  Net   2235 ml   Filed Weights   11/01/13 1708  Weight: 172.367 kg (380 lb)    PHYSICAL EXAM Filed Vitals:   11/03/13 0001 11/03/13 0330 11/03/13 0748 11/03/13 1149  BP:  147/103 150/97 150/107  Pulse:      Temp: 98.3 F (36.8 C) 98 F (36.7 C) 99 F (37.2 C) 98.4 F (36.9 C)  TempSrc: Oral Oral Oral Oral  Resp:  24 33 29  Height:      Weight:      SpO2:  97% 98% 99%   General: Alert, oriented x3, no distress Head: no evidence of trauma, PERRL, EOMI, no exophtalmos or lid lag, no myxedema, no xanthelasma; normal ears, nose and oropharynx Neck: can't see jugular venous pulsations and no hepatojugular reflux; brisk carotid pulses without delay and no carotid bruits Chest: clear to auscultation, no signs of consolidation by percussion or palpation, normal fremitus, symmetrical and full respiratory excursions Cardiovascular: normal position and quality of the apical impulse, regular rhythm, normal first and second heart sounds, no rubs or gallops, no murmur Abdomen: no tenderness or distention, no masses by palpation, no abnormal pulsatility or arterial bruits, normal bowel sounds, no  hepatosplenomegaly Extremities: no clubbing, cyanosis or edema; 2+ radial, ulnar and brachial pulses bilaterally; 2+ right femoral, posterior tibial and dorsalis pedis pulses; 2+ left femoral, posterior tibial and dorsalis pedis pulses; no subclavian or femoral bruits Neurological: grossly nonfocal  LABS  CBC  Recent Labs  11/01/13 1730  WBC 10.8*  HGB 11.2*  HCT 33.9*  MCV 68.6*  PLT 435*   Basic Metabolic Panel  Recent Labs  11/01/13 1730 11/02/13 0635  NA 138 137  K 4.3 4.1  CL 99 99  CO2 27 27  GLUCOSE 102* 118*  BUN 11 9  CREATININE 0.92 0.96  CALCIUM 9.2 8.6   Liver Function Tests No results found for this basename: AST, ALT, ALKPHOS, BILITOT, PROT, ALBUMIN,  in the last 72 hours No results found for this basename: LIPASE, AMYLASE,  in the last 72 hours Cardiac Enzymes No results found for this basename: CKTOTAL, CKMB, CKMBINDEX, TROPONINI,  in the last 72 hours BNP No components found with this basename: POCBNP,  D-Dimer No results found for this basename: DDIMER,  in the last 72 hours Hemoglobin A1C No results found for this basename: HGBA1C,  in the last 72 hours Fasting Lipid Panel No results found for this basename: CHOL, HDL, LDLCALC, TRIG, CHOLHDL, LDLDIRECT,  in the last 72 hours Thyroid Function Tests  Recent Labs  11/02/13 0045  TSH 1.957  Radiology Studies Imaging results have been reviewed and Dg Chest 2 View  11/01/2013   CLINICAL DATA:  33 year old male with shortness of breath and weakness.  EXAM: CHEST  2 VIEW  COMPARISON:  05/28/2013  FINDINGS: Enlargement of the cardiopericardial silhouette has increased since the prior study and a pericardial effusion is not excluded.  There is no evidence of focal airspace disease, pulmonary edema, suspicious pulmonary nodule/mass, pleural effusion, or pneumothorax. No acute bony abnormalities are identified.  IMPRESSION: Interval enlargement of the cardiopericardial silhouette - pericardial effusion  is not excluded.  No other significant abnormalities noted.   Electronically Signed   By: Laveda AbbeJeff  Hu M.D.   On: 11/01/2013 19:21   Ct Angio Chest Pe W/cm &/or Wo Cm  11/01/2013   CLINICAL DATA:  Midsternal chest pain with shortness of breath.  EXAM: CT ANGIOGRAPHY CHEST WITH CONTRAST  TECHNIQUE: Multidetector CT imaging of the chest was performed using the standard protocol during bolus administration of intravenous contrast. Multiplanar CT image reconstructions and MIPs were obtained to evaluate the vascular anatomy.  CONTRAST:  100mL OMNIPAQUE IOHEXOL 350 MG/ML SOLN  COMPARISON:  DG CHEST 2 VIEW dated 11/01/2013  FINDINGS: No pulmonary arterial filling defects are identified to suggest pulmonary emboli. The main pulmonary artery is mildly enlarged, measuring approximately 3.2 cm in diameter. There is a moderately large pericardial effusion. Prevascular lymph nodes measure up to 11 mm in short axis. Precarinal lymph node measures 1.5 cm. There is a small left pleural effusion. Dependent opacities in the left greater than right lung bases likely represent atelectasis.  The visualized portion the upper abdomen is unremarkable. The osseous structures are unremarkable.  Review of the MIP images confirms the above findings.  IMPRESSION: 1. No evidence of pulmonary emboli. 2. Moderately large pericardial effusion. 3. Mildly enlarged mediastinal lymph nodes, most likely reactive. 4. Small left pleural effusion. 5. Mild enlargement of the main pulmonary artery, nonspecific but can be seen in the setting of pulmonary arterial hypertension.  Items 1 and 2 were called by telephone at the time of interpretation on 11/01/2013 at 11:53 PM to Dr. Lorre NickANTHONY ALLEN , who verbally acknowledged these results.   Electronically Signed   By: Sebastian AcheAllen  Grady   On: 11/01/2013 23:58    TELE NSR   ASSESSMENT AND PLAN Inflammatory pericarditis, likely viral, complicated by tamponade and without rapid reaccumulation after  pericardiocentesis. Short course steroids, NSAIDs and colchicine for a couple of weeks.  BP is high now - he has a history of HTN in past that "got better". Start amlodipine. Consider outpatient sleep study. Probably DC today. NSAIDs and steroids will worsen HBP in the short run and multiple adjustment in meds may be necessary in the near future.  Thurmon FairMihai Ilah Boule, MD, [facility] [phone] office [phone] pager 11/03/2013 1:48 PM

## 2013-11-03 NOTE — Progress Notes (Signed)
Transferred to 2west room 19 by wheelchair stable, report given to RN, belongings with pt.

## 2013-11-04 ENCOUNTER — Encounter (HOSPITAL_COMMUNITY): Payer: Self-pay | Admitting: Physician Assistant

## 2013-11-04 ENCOUNTER — Other Ambulatory Visit: Payer: Self-pay | Admitting: Physician Assistant

## 2013-11-04 DIAGNOSIS — I10 Essential (primary) hypertension: Secondary | ICD-10-CM

## 2013-11-04 DIAGNOSIS — I314 Cardiac tamponade: Secondary | ICD-10-CM

## 2013-11-04 MED ORDER — OMEPRAZOLE 20 MG PO CPDR: 20.0000 mg | DELAYED_RELEASE_CAPSULE | Freq: Every day | ORAL | Status: DC

## 2013-11-04 MED ORDER — COLCHICINE 0.6 MG PO TABS: 0.6000 mg | ORAL_TABLET | Freq: Two times a day (BID) | ORAL | Status: DC

## 2013-11-04 MED ORDER — PREDNISONE 10 MG PO TABS
ORAL_TABLET | ORAL | Status: DC
Start: 2013-11-04 — End: 2013-11-12

## 2013-11-04 MED ORDER — IBUPROFEN 800 MG PO TABS: 800.0000 mg | ORAL_TABLET | Freq: Three times a day (TID) | ORAL | Status: DC

## 2013-11-04 MED ORDER — HYDROCHLOROTHIAZIDE 25 MG PO TABS: 25.0000 mg | ORAL_TABLET | Freq: Every day | ORAL | Status: DC

## 2013-11-04 MED ORDER — AMLODIPINE BESYLATE 5 MG PO TABS: 5.0000 mg | ORAL_TABLET | Freq: Every day | ORAL | Status: DC

## 2013-11-04 NOTE — Discharge Summary (Signed)
Discharge Summary   Patient ID: Caleb [name]. MRN: [id number], DOB/AGE: 10-15-1980 33 y.o. Admit date: 11/01/2013 D/C date:     11/04/2013  Primary Care Provider: No PCP Per Patient Primary Cardiologist: Aundra Dubin  Primary Discharge Diagnoses:  1. Acute pericarditis with cardiac tamponade s/p pericardiocentesis 2. HTN 3. Morbid obesity Body mass index is 54.52 kg/(m^2). 4. LV dysfunction by initial echo - EF 45-50% with followup echo 55-60% 5. Mild enlargement of pulmonary artery by CT 11/01/13 6. Abnormal CBC on admission - elevated WBC, microcytic anemia, mild thrombocytopenia  Secondary Discharge Diagnoses:  1. H/o GERD  Hospital Course: Caleb [name] is a 33 y/o morbidly obese M morbidly obese M with no known cardiac disease but history of GERD who presented to [facility] 11/01/2013 for evaluation of chest pain and dyspnea. 2 weeks, ago, patient developed central chest pain which was consistent through day of admission. The pain was worse with inspiration and with lying down, better with sitting up.  About a week ago, he additionally developed exertional dyspnea. 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 He finally came to the ER for evaluation where temp was 101 and he was sent for CTA chest. There was no PNA or PE but he did have a moderate to large pericardial effusion. There were mildly enlarged mediastinal lymph nodes felt reactive, small L pleural effusion and mild enlargement of the main pulmonary artery.  BP was stable in the ER and HR was in the 90s to 100s, sinus. EKG showed NSR, inferior/lateral/anterolateral slight ST elevation with PR depression in II and V6 and PR elevation in AVR. It was suspected he had acute pericarditis with associated pericardial effusion. No TB exposures. He was started on colchicine and ibuprofen. 2D Echo on 3/24 showed mild LVH, EF 45-50%, grade 1 d/d, mildly dilated LA/RA, PAP 51mHg, "moderate, free-flowing  pericardial effusion was identified circumferential to the heart. The fluid had no internal echoes. There was mild diastolic RV inversion which appeared to change with respiration as well as dilated IVC with blunted respiratory variation that is suggestive of elevated intrapericardial pressure. As well, there was marked mitral valve and tricuspid valveinflow Doppler respiratory variation. Effusion measures 1.63cm along posterior wall, 2.88cm along basal RV free wall, 1.37cm lateral wall." Although he was hemodynamically stable, echo was felt suggestive of near tamponade. CRP was 23 and ESR was 50. Therapeutic pericardiocentesis was recommended. He underwent this on 3/24 which yielded a total of 650cc hemorrhagic pericardial fluid. Followup limited echocardiogram showed EF 55-60% with incoordinate septal mostion, trivial to small pericardial effusion. Culture was negative for organisms or AFB. He was also started on a short course of steroids. BP was felt to be elevated - the pt endorsed prior history of HTN which improved, but given elevated levels in hospital, amlodipine and HCTZ were started. This morning he is feeling better. Dr. CBurt [name] seen and examined the patient today and feels she is stable for discharge.  We will continue ibuprofen [id number] 8 hours, prednisone taper over 1 week, and colchicine if he is able to afford it. We have asked care management to see him prior to discharge. At his followup appointment next week, it can be determined if he is ready to discontinue ibuprofen. He was given 3 months worth RX of colchicine. We have also prescribed PPI. Will also followup BMET 1 week given HCTZ initiation in setting of high dose ibuprofen use.  He was also asked to follow up with his primary care doctor as well to discuss CBC and discuss sleep study. He had abnormalities in his initial CBC with elevated WBC 10.8, microcytic anemia Hgb 11.2 MCV 68.6, Plt 435.    Discharge Vitals: Blood pressure  140/79, pulse 84, temperature 97.8 F (36.6 C), temperature source Oral, resp. rate 18, height '5\' 10"'  (1.778 m), weight 380 lb (172.367 kg), SpO2 96.00%.  Labs: Lab Results  Component Value Date   WBC 10.8* 11/01/2013   HGB 11.2* 11/01/2013   HCT 33.9* 11/01/2013   MCV 68.6* 11/01/2013   PLT 435* 11/01/2013     Recent Labs Lab 11/02/13 0635  NA 137  K 4.1  CL 99  CO2 27  BUN 9  CREATININE 0.96  CALCIUM 8.6  GLUCOSE 118*     Diagnostic Studies/Procedures   Dg Chest 2 View  11/01/2013   CLINICAL DATA:  33 year old male with shortness of breath and weakness.  EXAM: CHEST  2 VIEW  COMPARISON:  05/28/2013  FINDINGS: Enlargement of the cardiopericardial silhouette has increased since the prior study and a pericardial effusion is not excluded.  There is no evidence of focal airspace disease, pulmonary edema, suspicious pulmonary nodule/mass, pleural effusion, or pneumothorax. No acute bony abnormalities are identified.  IMPRESSION: Interval enlargement of the cardiopericardial silhouette - pericardial effusion is not excluded.  No other significant abnormalities noted.   Electronically Signed   By: Hassan Rowan M.D.   On: 11/01/2013 19:21   Ct Angio Chest Pe W/cm &/or Wo Cm  11/01/2013   CLINICAL DATA:  Midsternal chest pain with shortness of breath.  EXAM: CT ANGIOGRAPHY CHEST WITH CONTRAST  TECHNIQUE: Multidetector CT imaging of the chest was performed using the standard protocol during bolus administration of intravenous contrast. Multiplanar CT image reconstructions and MIPs were obtained to evaluate the vascular anatomy.  CONTRAST:  159m OMNIPAQUE IOHEXOL 350 MG/ML SOLN  COMPARISON:  DG CHEST 2 VIEW dated 11/01/2013  FINDINGS: No pulmonary arterial filling defects are identified to suggest pulmonary emboli. The main pulmonary artery is mildly enlarged, measuring approximately 3.2 cm in diameter. There is a moderately large pericardial effusion. Prevascular lymph nodes measure up to 11 mm in  short axis. Precarinal lymph node measures 1.5 cm. There is a small left pleural effusion. Dependent opacities in the left greater than right lung bases likely represent atelectasis.  The visualized portion the upper abdomen is unremarkable. The osseous structures are unremarkable.  Review of the MIP images confirms the above findings.  IMPRESSION: 1. No evidence of pulmonary emboli. 2. Moderately large pericardial effusion. 3. Mildly enlarged mediastinal lymph nodes, most likely reactive. 4. Small left pleural effusion. 5. Mild enlargement of the main pulmonary artery, nonspecific but can be seen in the setting of pulmonary arterial hypertension.  Items 1 and 2 were called by telephone at the time of interpretation on 11/01/2013 at 11:53 PM to Dr. ALacretia Leigh, who verbally acknowledged these results.   Electronically Signed   By: ALogan Bores  On: 11/01/2013 23:58   Dg Shoulder Left  10/16/2013   CLINICAL DATA:  Two 3 week history of shoulder pain without history of trauma  EXAM: LEFT SHOULDER - 2+ VIEW  COMPARISON:  None.  FINDINGS: Three views of the left shoulder reveal the bones to be adequately mineralized. There is no evidence of an acute fracture nor dislocation. No significant bony degenerative change is demonstrated. The observed portions of the left clavicle and upper  left ribs appear normal.  IMPRESSION: There is no acute bony abnormality of the left shoulder.   Electronically Signed   By: David  Martinique   On: 10/16/2013 19:01   Pericardiocentesis - see report  Discharge Medications   Current Discharge Medication List    START taking these medications   Details  amLODipine (NORVASC) 5 MG tablet Take 1 tablet (5 mg total) by mouth daily. Qty: 30 tablet, Refills: 6    colchicine 0.6 MG tablet Take 1 tablet (0.6 mg total) by mouth 2 (two) times daily. Qty: 60 tablet, Refills: 2    hydrochlorothiazide (HYDRODIURIL) 25 MG tablet Take 1 tablet (25 mg total) by mouth daily. Qty: 30 tablet,  Refills: 6    omeprazole (PRILOSEC) 20 MG capsule Take 1 capsule (20 mg total) by mouth daily. Qty: 30 capsule, Refills: 1    predniSONE (DELTASONE) 10 MG tablet By mouth - Take 5 tablets for 3 days, then 4 tablets for 1 day, then 3 tablets for 1 day, then 2 tablets for 1 day, then 1 tablet for 1 day, then stop. Qty: 25 tablet, Refills: 0      CONTINUE these medications which have CHANGED   Details  ibuprofen (ADVIL,MOTRIN) 800 MG tablet Take 1 tablet (800 mg total) by mouth every 8 (eight) hours. Take with food. Qty: 30 tablet, Refills: 1      CONTINUE these medications which have NOT CHANGED   Details  acetaminophen (TYLENOL) 500 MG tablet Take 500 mg by mouth every 6 (six) hours as needed.      STOP taking these medications     diclofenac sodium (VOLTAREN) 1 % GEL         Disposition   The patient will be discharged in stable condition to home. Discharge Orders   Future Appointments Provider Department Dept Phone   11/12/2013 2:30 PM [facility] [place] Office [phone]   11/12/2013 2:40 PM Liliane Shi, PA-C [facility] [phone]   Future Orders Complete By Expires   Diet - low sodium heart healthy  As directed    Discharge instructions  As directed    Comments:     Please follow up with your primary care doctor for recheck of your blood count. Your CBC was slightly abnormal with elevated white blood cell count and platelet count, and mildly decreased hemoglobin. This may be due to your pericarditis but should be rechecked in about a week. With your weight, you may be at risk for sleep apnea - please discuss obtaining a sleep study to further evaluate.  At your followup appointment with the cardiologist, please ask when you should stop the ibuprofen. This is usually taken for not longer than 2 weeks.  We usually continue the colchicine for 3 months.   Increase activity slowly  As directed    Scheduling Instructions:     Keep  procedure site clean & dry. If you notice increased pain, swelling, bleeding or pus, call/return! You may return to work in 1 week (if still feeling well).     Follow-up Information   Follow up with Richardson Dopp, PA-C. ([facility] - 11/12/13 at 2:40pm with labwork.)    Specialty:  Physician Assistant   Contact information:   [address] [address] [phone]       Follow up with Primary Care Doctor. (See discharge instructions section)         Duration of Discharge Encounter: Greater  than 30 minutes including physician and PA time.  Signed, Quadre Bristol PA-C 11/04/2013, 10:33 AM    .

## 2013-11-04 NOTE — Progress Notes (Signed)
    Subjective:  No chest pain or dyspnea. Feels better today.  Objective:  Vital Signs in the last 24 hours: Temp:  [96.9 F (36.1 C)-98.4 F (36.9 C)] 97.8 F (36.6 C) (03/26 0420) Pulse Rate:  [84-97] 84 (03/26 0420) Resp:  [18-29] 18 (03/26 0420) BP: (123-169)/(74-107) 140/79 mmHg (03/26 0420) SpO2:  [91 %-100 %] 96 % (03/26 0420)  Intake/Output from previous day: 03/25 0701 - 03/26 0700 In: 845 [P.O.:200; I.V.:625] Out: 650 [Urine:650]  Physical Exam: Pt is alert and oriented, NAD, morbidly obese male in NAD HEENT: normal Neck: JVP - normal Lungs: CTA bilaterally CV: RRR without murmur or gallop Abd: soft, NT, Positive BS, obese Ext: no C/C/E, distal pulses intact and equal Skin: warm/dry no rash   Lab Results:  Recent Labs  11/01/13 1730  WBC 10.8*  HGB 11.2*  PLT 435*    Recent Labs  11/01/13 1730 11/02/13 0635  NA 138 137  K 4.3 4.1  CL 99 99  CO2 27 27  GLUCOSE 102* 118*  BUN 11 9  CREATININE 0.92 0.96   No results found for this basename: TROPONINI, CK, MB,  in the last 72 hours  2D Echo: Study Conclusions  - Limited echo to assess pericardial effusion - Left ventricle: Systolic function was normal. The estimated ejection fraction was in the range of 55% to 60%. Incoordinate septal motion. - Pericardium, extracardiac: Trivial to small pericardial effusion, mostly posterior. Assessment for tamponade physiology was not made.  Assessment/Plan:  1. Acute pericarditis with cardiac tamponade - s/p pericardiocentesis. Clinically improved. Stable for discharge today. Would continue with ibuprofen 800 mg every 8 hours, Prednisone taper over one week, and colchicine (if he is able to get it - cost may be an issue). Pred 50 mg x 3 days total, then 40mg  x 1 day, 30 mg x 1 day, 20, 10, stop. Should have TOC visit next week.  2. HTN - BP better controlled on HCTZ and amlodipine. Continue same.   Tonny BollmanMichael Shizuye Rupert, M.D. 11/04/2013, 8:05 AM

## 2013-11-05 LAB — MISCELLANEOUS TEST

## 2013-11-06 LAB — BODY FLUID CULTURE: Culture: NO GROWTH

## 2013-11-08 LAB — CULTURE, BLOOD (ROUTINE X 2)
CULTURE: NO GROWTH
Culture: NO GROWTH

## 2013-11-12 ENCOUNTER — Encounter: Payer: Self-pay | Admitting: Physician Assistant

## 2013-11-12 ENCOUNTER — Ambulatory Visit (INDEPENDENT_AMBULATORY_CARE_PROVIDER_SITE_OTHER): Payer: Self-pay | Admitting: *Deleted

## 2013-11-12 ENCOUNTER — Ambulatory Visit (INDEPENDENT_AMBULATORY_CARE_PROVIDER_SITE_OTHER): Payer: Self-pay | Admitting: Physician Assistant

## 2013-11-12 VITALS — BP 154/106 | HR 98 | Ht 70.0 in | Wt 378.4 lb

## 2013-11-12 DIAGNOSIS — I319 Disease of pericardium, unspecified: Secondary | ICD-10-CM

## 2013-11-12 DIAGNOSIS — I309 Acute pericarditis, unspecified: Secondary | ICD-10-CM

## 2013-11-12 DIAGNOSIS — R9431 Abnormal electrocardiogram [ECG] [EKG]: Secondary | ICD-10-CM

## 2013-11-12 DIAGNOSIS — I10 Essential (primary) hypertension: Secondary | ICD-10-CM

## 2013-11-12 LAB — BASIC METABOLIC PANEL
BUN: 24 mg/dL — AB (ref 6–23)
CO2: 27 mEq/L (ref 19–32)
Calcium: 9 mg/dL (ref 8.4–10.5)
Chloride: 99 mEq/L (ref 96–112)
Creat: 0.91 mg/dL (ref 0.50–1.35)
Glucose, Bld: 128 mg/dL — ABNORMAL HIGH (ref 70–99)
POTASSIUM: 3.5 meq/L (ref 3.5–5.3)
Sodium: 137 mEq/L (ref 135–145)

## 2013-11-12 MED ORDER — AMLODIPINE BESYLATE 10 MG PO TABS: 10.0000 mg | ORAL_TABLET | Freq: Every day | ORAL | Status: DC

## 2013-11-12 NOTE — Progress Notes (Signed)
[address] [place], [place]  [address] Phone: [phone] Fax:  [phone]  Date:  11/12/2013   ID:  Caleb MomentMyron T Klauer Jr., DOB 12-29-1980, MRN [id number]  PCP:  No PCP Per Patient  Cardiologist:  Dr. Marca Anconaalton McLean     History of Present Illness: Caleb MomentMyron T Nand Jr. is a 33 y.o. male with a past medical history of GERD. He was admitted 3/23-3/26 presenting with chest pain and dyspnea. He was noted to have moderate to large pericardial effusion on chest CT. He had diffuse ST elevation consistent with acute pericarditis. Reactive lymph nodes were noted in the mediastinum on his chest CT.  Echocardiogram was consistent with near tamponade.  He underwent pericardiocentesis with drainage of 650 cc of hemorrhagic fluid.  He was placed on high-dose ibuprofen, prednisone taper x1 week and colchicine.  Cultures, AFB smear and cytology were all negative.   He returns for follow up. He has already gone back to work. 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 He did have some slight chest pressure when he first went back to work. However, he has had no further chest pain. He denies dyspnea, orthopnea, PND or edema. He denies syncope, cough or fever.  Studies:  - Echo (11/02/13):  EF 45-50%, moderate pericardial effusion, grade 1 diastolic dysfunction, mild LAE, mild RAE, PASP 31  - Echo (11/03/13): EF 55-60%, trivial to small pericardial effusion   Recent Labs: 05/27/2013: ALT 36  11/01/2013: Hemoglobin 11.2*  11/02/2013: Creatinine 0.96; Potassium 4.1; TSH 1.957   Wt Readings from Last 3 Encounters:  11/12/13 378 lb 6.4 oz (171.641 kg)  11/01/13 380 lb (172.367 kg)  11/01/13 380 lb (172.367 kg)     Past Medical History  Diagnosis Date  . HTN (hypertension)   . GERD (gastroesophageal reflux disease)   . LV dysfunction     a. 10/2013:  EF 45-50% with followup echo 55-60%.  . Cardiac tamponade     a. 10/2013: Acute pericarditis with  cardiac tamponade s/p pericardiocentesis.  . Pericarditis     a. 10/2013: Acute pericarditis with cardiac tamponade s/p pericardiocentesis.  . Morbid obesity   . Enlarged pulmonary artery     a. Mild enlargement of pulmonary artery by CT 11/01/13.  . Abnormal CBC     a. 10/2013 during adm for pericarditis - elevated WBC, microcytic anemia, mild thrombocytopenia.    Current Outpatient Prescriptions  Medication Sig Dispense Refill  . acetaminophen (TYLENOL) 500 MG tablet Take 500 mg by mouth every 6 (six) hours as needed.      Marland Kitchen. amLODipine (NORVASC) 5 MG tablet Take 1 tablet (5 mg total) by mouth daily.  30 tablet  6  . colchicine 0.6 MG tablet Take 1 tablet (0.6 mg total) by mouth 2 (two) times daily.  60 tablet  2  . hydrochlorothiazide (HYDRODIURIL) 25 MG tablet Take 1 tablet (25 mg total) by mouth daily.  30 tablet  6  . ibuprofen (ADVIL,MOTRIN) 800 MG tablet Take 1 tablet (800 mg total) by mouth every 8 (eight) hours. Take with food.  30 tablet  1  . omeprazole (PRILOSEC) 20 MG capsule Take 1 capsule (20 mg total) by mouth daily.  30 capsule  1  . predniSONE (DELTASONE) 10 MG tablet By mouth - Take 5 tablets for 3 days, then 4 tablets for 1 day, then 3 tablets for 1 day, then 2 tablets for 1 day, then 1 tablet for 1 day,  then stop.  25 tablet  0   No current facility-administered medications for this visit.    Allergies:   Review of patient's allergies indicates no known allergies.   Social History:  The patient  reports that he has never smoked. He does not have any smokeless tobacco history on file. He reports that he does not drink alcohol or use illicit drugs.   Family History:  The patient's family history includes CAD in an other family member; Heart failure in an other family member.   ROS:  Please see the history of present illness.     All other systems reviewed and negative.   PHYSICAL EXAM: VS:  BP 154/106  Pulse 98  Ht 5\' 10"  (1.778 m)  Wt 378 lb 6.4 oz (171.641 kg)   BMI 54.29 kg/m2 Well nourished, well developed, in no acute distress HEENT: normal Neck: no JVD Cardiac:  normal S1, S2; RRR; no murmurno rub Lungs:  clear to auscultation bilaterally, no wheezing, rhonchi or rales Abd: soft, nontender, no hepatomegaly Ext: no edema Skin: warm and dry Neuro:  CNs 2-12 intact, no focal abnormalities noted  EKG:  NSR, HR 98, T wave inversions in 1, 2, aVL, V3-V6     ASSESSMENT AND PLAN:  1. Pericarditis: He has had significant symptom improvement. He has completed his prednisone taper. I have asked him to stop ibuprofen. We discussed the importance of remaining on colchicine for at least 3 months.  He has no residual symptoms. I do not think he needs a follow up echocardiogram at this point. 2. Hypertension: Uncontrolled. Increase Norvasc to 10 mg daily. Stop NSAIDs as noted above.  Check a basic metabolic panel today. 3. Abnormal ECG:  I suspect this is related to uncontrolled blood pressure. This will be repeated upon return. Consider follow up echocardiogram at that point if he continues to have similar changes. 4. Disposition: Follow up with me in 2-3 weeks.  Signed, Tereso Newcomer, PA-C  11/12/2013 2:51 PM

## 2013-11-12 NOTE — Patient Instructions (Signed)
Your physician has recommended you make the following change in your medication:   1. INCREASE NORVASC TO 10 MG DAILY  2. STOP IBUPROFEN 3.STOP PREDNISONE 4.CONTINUE COLCHICINE UNTIL FURTHER NOTIFIED BY CARDIOLOGY  Your physician recommends that you return for lab work in: BMET TODAY  Your physician recommends that you schedule a follow-up appointment in: 2 WEEKS WITH [employer] [employer]ER

## 2013-11-15 ENCOUNTER — Telehealth: Payer: Self-pay | Admitting: *Deleted

## 2013-11-15 LAB — MISCELLANEOUS TEST

## 2013-11-15 NOTE — Telephone Encounter (Signed)
lmptcb for lab results 

## 2013-11-15 NOTE — Telephone Encounter (Signed)
pt notified about lab results with verbal understanding  

## 2013-11-20 ENCOUNTER — Encounter (HOSPITAL_COMMUNITY): Payer: Self-pay | Admitting: Emergency Medicine

## 2013-11-20 ENCOUNTER — Emergency Department (INDEPENDENT_AMBULATORY_CARE_PROVIDER_SITE_OTHER)
Admission: EM | Admit: 2013-11-20 | Discharge: 2013-11-20 | Disposition: A | Discharge status: Home / Self Care | Payer: Self-pay | Source: Home / Self Care | Attending: Family Medicine | Admitting: Family Medicine

## 2013-11-20 DIAGNOSIS — H6120 Impacted cerumen, unspecified ear: Secondary | ICD-10-CM

## 2013-11-20 DIAGNOSIS — H60399 Other infective otitis externa, unspecified ear: Secondary | ICD-10-CM

## 2013-11-20 DIAGNOSIS — H6090 Unspecified otitis externa, unspecified ear: Secondary | ICD-10-CM

## 2013-11-20 MED ORDER — NEOMYCIN-POLYMYXIN-HC 1 % OT SOLN
3.0000 [drp] | Freq: Four times a day (QID) | OTIC | Status: DC
  Administered 2013-11-20: 3 [drp] via OTIC

## 2013-11-20 NOTE — ED Notes (Signed)
Large amount of ear wax removed from left ear while irrigating

## 2013-11-20 NOTE — ED Provider Notes (Signed)
Caleb MomentMyron T Rahrig Jr. is a 33 y.o. male who presents to Urgent Care today for left ear pain. Patient is experiencing left ear pain and fullness for the past 2 days. The previous week he has had coughing congestion which are resolving. He denies any fevers or chills nausea vomiting or diarrhea. He does note decreased hearing. His medical history significant for recent history of cardiac tamponade secondary to pericarditis. He currently is not taking colchicine.  He's tried peroxide which has not helped. No other medications.   Past Medical History  Diagnosis Date  . HTN (hypertension)   . GERD (gastroesophageal reflux disease)   . LV dysfunction     a. 10/2013:  EF 45-50% with followup echo 55-60%.  . Cardiac tamponade     a. 10/2013: Acute pericarditis with cardiac tamponade s/p pericardiocentesis.  . Pericarditis     a. 10/2013: Acute pericarditis with cardiac tamponade s/p pericardiocentesis.  . Morbid obesity   . Enlarged pulmonary artery     a. Mild enlargement of pulmonary artery by CT 11/01/13.  . Abnormal CBC     a. 10/2013 during adm for pericarditis - elevated WBC, microcytic anemia, mild thrombocytopenia.   History  Substance Use Topics  . Smoking status: Never Smoker   . Smokeless tobacco: Not on file  . Alcohol Use: No   ROS as above Medications: Current Facility-Administered Medications  Medication Dose Route Frequency Provider Last Rate Last Dose  . NEOMYCIN-POLYMYXIN-HYDROCORTISONE (CORTISPORIN) otic solution 3 drop  3 drop Left Ear 4 times per day Rodolph BongEvan S Suad Autrey, MD   3 drop at 11/20/13 1137   Current Outpatient Prescriptions  Medication Sig Dispense Refill  . amLODipine (NORVASC) 10 MG tablet Take 1 tablet (10 mg total) by mouth daily.  30 tablet  6  . hydrochlorothiazide (HYDRODIURIL) 25 MG tablet Take 1 tablet (25 mg total) by mouth daily.  30 tablet  6  . acetaminophen (TYLENOL) 500 MG tablet Take 500 mg by mouth every 6 (six) hours as needed.      . colchicine 0.6 MG  tablet Take 1 tablet (0.6 mg total) by mouth 2 (two) times daily.  60 tablet  2  . omeprazole (PRILOSEC) 20 MG capsule Take 1 capsule (20 mg total) by mouth daily.  30 capsule  1    Exam:  BP 150/92  Pulse 82  Temp(Src) 97.8 F (36.6 C) (Oral)  Resp 18  SpO2 100% Gen: Well NAD HEENT: EOMI,  MMM LEFT ear occluded by cerumen. Right tympanic membranes normal. Lungs: Normal work of breathing. CTABL  Heart: RRR no MRG no  friction rub Abd: NABS, Soft. NT, ND Exts: Brisk capillary refill, warm and well perfused.   Following removal of the cerumen the patient felt somewhat better. His tympanic membrane was erythematous and retracted with no effusion.  No results found for this or any previous visit (from the past 24 hour(s)). No results found.  Assessment and Plan: 33 y.o. male with left cerumen impaction with resulting otitis externa. Plan to treat with Cortisporin ear drops. Followup with primary care provider.  Discussed warning signs or symptoms. Please see discharge instructions. Patient expresses understanding.    Rodolph BongEvan S Krissy Orebaugh, MD 11/20/13 [phone]

## 2013-11-20 NOTE — Discharge Instructions (Signed)
Thank you for coming in today. Use the Cortisporin drops every 6 hours for one week.  Return as needed Call or go to the emergency room if you get worse, have trouble breathing, have chest pains, or palpitations.  Cerumen Impaction A cerumen impaction is when the wax in your ear forms a plug. This plug usually causes reduced hearing. Sometimes it also causes an earache or dizziness. Removing a cerumen impaction can be difficult and painful. The wax sticks to the ear canal. The canal is sensitive and bleeds easily. If you try to remove a heavy wax buildup with a cotton tipped swab, you may push it in further. Irrigation with water, suction, and small ear curettes may be used to clear out the wax. If the impaction is fixed to the skin in the ear canal, ear drops may be needed for a few days to loosen the wax. People who build up a lot of wax frequently can use ear wax removal products available in your local drugstore. SEEK MEDICAL CARE IF:  You develop an earache, increased hearing loss, or marked dizziness. Document Released: 09/05/2004 Document Revised: 10/21/2011 Document Reviewed: 10/26/2009 [facility] Patient Information 2014 [employer] [place] [employer] [place].

## 2013-11-20 NOTE — ED Notes (Signed)
Pt reports a cold the past week and left ear pain the past 2 days

## 2013-11-25 ENCOUNTER — Encounter: Payer: Self-pay | Admitting: *Deleted

## 2013-11-30 ENCOUNTER — Ambulatory Visit: Payer: Self-pay | Admitting: Physician Assistant

## 2013-12-14 ENCOUNTER — Encounter: Payer: Self-pay | Admitting: Physician Assistant

## 2013-12-14 ENCOUNTER — Ambulatory Visit (INDEPENDENT_AMBULATORY_CARE_PROVIDER_SITE_OTHER): Payer: PRIVATE HEALTH INSURANCE | Admitting: Physician Assistant

## 2013-12-14 ENCOUNTER — Ambulatory Visit (HOSPITAL_COMMUNITY): Payer: No Typology Code available for payment source | Attending: Physician Assistant | Admitting: Cardiology

## 2013-12-14 ENCOUNTER — Encounter (INDEPENDENT_AMBULATORY_CARE_PROVIDER_SITE_OTHER): Payer: Self-pay

## 2013-12-14 VITALS — BP 138/97 | HR 103 | Ht 70.0 in | Wt 386.0 lb

## 2013-12-14 DIAGNOSIS — I10 Essential (primary) hypertension: Secondary | ICD-10-CM

## 2013-12-14 DIAGNOSIS — R0989 Other specified symptoms and signs involving the circulatory and respiratory systems: Secondary | ICD-10-CM | POA: Insufficient documentation

## 2013-12-14 DIAGNOSIS — R0609 Other forms of dyspnea: Secondary | ICD-10-CM | POA: Insufficient documentation

## 2013-12-14 DIAGNOSIS — Z86711 Personal history of pulmonary embolism: Secondary | ICD-10-CM | POA: Insufficient documentation

## 2013-12-14 DIAGNOSIS — R079 Chest pain, unspecified: Secondary | ICD-10-CM

## 2013-12-14 DIAGNOSIS — I309 Acute pericarditis, unspecified: Secondary | ICD-10-CM

## 2013-12-14 MED ORDER — IBUPROFEN 800 MG PO TABS: 800.0000 mg | ORAL_TABLET | Freq: Three times a day (TID) | ORAL | Status: DC

## 2013-12-14 MED ORDER — METOPROLOL SUCCINATE ER 50 MG PO TB24: 50.0000 mg | ORAL_TABLET | Freq: Every day | ORAL | Status: DC

## 2013-12-14 NOTE — Progress Notes (Signed)
[address] [place], [place]  [address] Phone: [phone]([phone] Fax:  [phone]  Date:  12/14/2013   ID:  Caleb [name], DOB Jan 02, 1981, MRN [id number]  PCP:  No PCP Per Patient  Cardiologist:  Dr. Marca Anconaalton McLean     History of Present Illness: Caleb BunMyron T Deroos is a 33 y.o. male with a past medical history of GERD. He was admitted 10/2013 with chest pain and dyspnea. He was noted to have moderate to large pericardial effusion on chest CT. He had diffuse ST elevation consistent with acute pericarditis. Reactive lymph nodes were noted in the mediastinum on his chest CT.  Echocardiogram was consistent with near tamponade.  He underwent pericardiocentesis with drainage of 650 cc of hemorrhagic fluid.  He was placed on high-dose ibuprofen, prednisone taper x1 week and colchicine.  Cultures, AFB smear and cytology were all negative.   I saw him in f/u 11/12/13.  I adjusted his BP medications for better BP control.    He returns today for f/u.  He had been doing well until yesterday.  He started to notice L sided chest pain and L shoulder pain.  This is pleuritic and worse with lying flat.  He has improvement with sitting up.  He notes a fever last night (102 degrees F).  He notes more DOE over the past few days.  No syncope.  No edema.   Studies:  - Echo (11/02/13):  EF 45-50%, moderate pericardial effusion, grade 1 diastolic dysfunction, mild LAE, mild RAE, PASP 31  - Echo (11/03/13): EF 55-60%, trivial to small pericardial effusion   Recent Labs: 05/27/2013: ALT 36  11/01/2013: Hemoglobin 11.2*  11/02/2013: TSH 1.957  11/12/2013: Creatinine 0.91; Potassium 3.5   Wt Readings from Last 3 Encounters:  12/14/13 386 lb (175.088 kg)  11/12/13 378 lb 6.4 oz (171.641 kg)  11/01/13 380 lb (172.367 kg)     Past Medical History  Diagnosis Date  . HTN (hypertension)   . GERD (gastroesophageal reflux disease)   . LV dysfunction     a. 10/2013:  EF 45-50% with followup echo 55-60%.  .  Cardiac tamponade     a. 10/2013: Acute pericarditis with cardiac tamponade s/p pericardiocentesis.  . Pericarditis     a. 10/2013: Acute pericarditis with cardiac tamponade s/p pericardiocentesis.  . Morbid obesity   . Enlarged pulmonary artery     a. Mild enlargement of pulmonary artery by CT 11/01/13.  . Abnormal CBC     a. 10/2013 during adm for pericarditis - elevated WBC, microcytic anemia, mild thrombocytopenia.    Current Outpatient Prescriptions  Medication Sig Dispense Refill  . acetaminophen (TYLENOL) 500 MG tablet Take 500 mg by mouth every 6 (six) hours as needed.      Marland Kitchen. amLODipine (NORVASC) 10 MG tablet Take 1 tablet (10 mg total) by mouth daily.  30 tablet  6  . colchicine 0.6 MG tablet Take 1 tablet (0.6 mg total) by mouth 2 (two) times daily.  60 tablet  2  . hydrochlorothiazide (HYDRODIURIL) 25 MG tablet Take 25 mg by mouth daily.        No current facility-administered medications for this visit.    Allergies:   Review of patient's allergies indicates no known allergies.   Social History:  The patient  reports that he has never smoked. He does not have any smokeless tobacco history on file. He reports that he does not drink alcohol or use illicit drugs.   Family History:  The patient's family history includes CAD in an other family member; Heart failure in an other family member.   ROS:  Please see the history of present illness.  He has had some diarrhea.  No cough.   All other systems reviewed and negative.   PHYSICAL EXAM: VS:  BP 138/97  Pulse 103  Ht 5\' 10"  (1.778 m)  Wt 386 lb (175.088 kg)  BMI 55.39 kg/m2 Well nourished, well developed, in no acute distress HEENT: normal Neck:  I cannot assess JVD Cardiac:  normal S1, S2; RRR; no murmur? rub Lungs:  clear to auscultation bilaterally, no wheezing, rhonchi or rales Abd: soft, nontender, no hepatomegaly Ext: no edema Skin: warm and dry Neuro:  CNs 2-12 intact, no focal abnormalities noted  EKG:   Sinus  tachy, HR 103, diff ST elevation c/w pericarditis      ASSESSMENT AND PLAN:  1. 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  Will get echo today to assess for recurrent effusion.  I have asked him to make sure he continues to take Colchicine 0.6 mg bid.  He will need to remain on this for 3 mos at least.  I will put him back on Ibuprofen 800 mg TID.  He will need to remain on this for at least 2 weeks.  I reviewed his case with Dr. Marca Anconaalton McLean.  At this point, we do not think he needs to be back on prednisone.  He has run a fever as well.  I will get a BMET, CBC, CXR as well.  He will return next week for close f/u.  2. Hypertension:  Uncontrolled.  He is not sure if he is taking HCTZ anymore.  I have asked him to review his medications and let us know what he is taking.  I will add Toprol XL 50 mg QD.  3. Disposition: Follow up with me in 1 week.  Signed, Tereso NewcomerScott Rama Mcclintock, PA-C  12/14/2013 3:48 PM

## 2013-12-14 NOTE — Patient Instructions (Addendum)
START TOPROL XL 50 MG DAILY; NEW RX WAS SENT IN TODAY  TAKE IBUPROFEN 800 MG THREE TIMES DAILY FOR 2 WEEKS THEN STOP  CHECK YOUR MEDICATIONS WHEN YOU GET HOME AND CALL BACK TOMORROW [phone] CAROL OR SCOTT WEAVER, PAC ;ET US KNOW IF YOU ARE TAKING HCTZ OR NOT  ALSO CHECK AND MAKE SURE YOU ARE TAKING YOUR COLCHICINE TWICE DAILY; IF NOT RE-START AT TWICE DAILY AND CALL AND WE WILL SEND IN A NEW PRESCRIPTION FOR YOU.  LAB WORK TODAY; BMET, CBC W/DIFF  YOU WILL NEED A CHEST X-RAY TOMORROW AT THE [facility] ON [address]OSS FROM [facility]; YOU WILL TAKE THE ELEVATOR TO THE BASEMENT FOR X-RAY DEPT.  PLEASE FOLLOW UP WITH [place] WEAVER, [facility] 12/21/13 @ 10:30

## 2013-12-14 NOTE — Progress Notes (Signed)
Echo completed

## 2013-12-15 ENCOUNTER — Telehealth: Payer: Self-pay | Admitting: *Deleted

## 2013-12-15 ENCOUNTER — Telehealth: Payer: Self-pay | Admitting: Physician Assistant

## 2013-12-15 ENCOUNTER — Encounter: Payer: Self-pay | Admitting: Physician Assistant

## 2013-12-15 ENCOUNTER — Ambulatory Visit (INDEPENDENT_AMBULATORY_CARE_PROVIDER_SITE_OTHER)
Admission: RE | Admit: 2013-12-15 | Discharge: 2013-12-15 | Disposition: A | Discharge status: Home / Self Care | Payer: PRIVATE HEALTH INSURANCE | Source: Ambulatory Visit | Attending: Physician Assistant | Admitting: Physician Assistant

## 2013-12-15 DIAGNOSIS — I309 Acute pericarditis, unspecified: Secondary | ICD-10-CM

## 2013-12-15 DIAGNOSIS — R079 Chest pain, unspecified: Secondary | ICD-10-CM

## 2013-12-15 LAB — CBC WITH DIFFERENTIAL/PLATELET
BASOS PCT: 0.4 % (ref 0.0–3.0)
Basophils Absolute: 0 10*3/uL (ref 0.0–0.1)
EOS ABS: 0.3 10*3/uL (ref 0.0–0.7)
EOS PCT: 3.1 % (ref 0.0–5.0)
HEMATOCRIT: 40.3 % (ref 39.0–52.0)
HEMOGLOBIN: 12.8 g/dL — AB (ref 13.0–17.0)
LYMPHS ABS: 2.7 10*3/uL (ref 0.7–4.0)
Lymphocytes Relative: 25.2 % (ref 12.0–46.0)
MCHC: 31.6 g/dL (ref 30.0–36.0)
MCV: 71.6 fl — AB (ref 78.0–100.0)
MONO ABS: 0.8 10*3/uL (ref 0.1–1.0)
Monocytes Relative: 7.7 % (ref 3.0–12.0)
NEUTROS ABS: 6.7 10*3/uL (ref 1.4–7.7)
Neutrophils Relative %: 63.6 % (ref 43.0–77.0)
Platelets: 280 10*3/uL (ref 150.0–400.0)
RBC: 5.63 Mil/uL (ref 4.22–5.81)
RDW: 18.2 % — ABNORMAL HIGH (ref 11.5–15.5)
WBC: 10.6 10*3/uL — AB (ref 4.0–10.5)

## 2013-12-15 LAB — BASIC METABOLIC PANEL
BUN: 10 mg/dL (ref 6–23)
CHLORIDE: 104 meq/L (ref 96–112)
CO2: 29 mEq/L (ref 19–32)
Calcium: 9.2 mg/dL (ref 8.4–10.5)
Creatinine, Ser: 0.9 mg/dL (ref 0.4–1.5)
GFR: 125.47 mL/min (ref >60.00)
Glucose, Bld: 100 mg/dL — ABNORMAL HIGH (ref 70–99)
POTASSIUM: 4 meq/L (ref 3.5–5.1)
Sodium: 139 mEq/L (ref 135–145)

## 2013-12-15 LAB — AFB CULTURE WITH SMEAR (NOT AT ARMC): Acid Fast Smear: NONE SEEN

## 2013-12-15 NOTE — Telephone Encounter (Signed)
ptcb and let us know he had not been taking the colchicine or HCTZ. I advised to re-start colchicine 0.6 BID, and let me d/w Bing NeighborsScott W. PA about the HCTZ if he wants to keep pt on or not. Advised i will cb later today, pt said he got cxr today.

## 2013-12-15 NOTE — Telephone Encounter (Signed)
Resume colchicine.  This is why he had recurrent pericarditis. Hold off on HCTZ for now. Tereso NewcomerScott Weaver, PA-C   12/15/2013 5:02 PM

## 2013-12-15 NOTE — Telephone Encounter (Signed)
pt notified about all test results and per Bing NeighborsScott W. PA NOT to re-start HCTZ. Pt verbalized Plan of Care, pt has f/u 5/13 w/ SW, PA.

## 2013-12-15 NOTE — Telephone Encounter (Signed)
Patient still had medications at home HCTZ & Colchicine. He hasn't been taking them, but he is going to start them back along with other medications that he was given yesterday.

## 2013-12-21 ENCOUNTER — Ambulatory Visit: Payer: PRIVATE HEALTH INSURANCE | Admitting: Physician Assistant

## 2013-12-22 ENCOUNTER — Encounter: Payer: Self-pay | Admitting: Physician Assistant

## 2013-12-22 ENCOUNTER — Ambulatory Visit (INDEPENDENT_AMBULATORY_CARE_PROVIDER_SITE_OTHER): Payer: PRIVATE HEALTH INSURANCE | Admitting: Physician Assistant

## 2013-12-22 VITALS — BP 144/98 | HR 78 | Ht 70.0 in | Wt 392.0 lb

## 2013-12-22 DIAGNOSIS — R0989 Other specified symptoms and signs involving the circulatory and respiratory systems: Secondary | ICD-10-CM

## 2013-12-22 DIAGNOSIS — I319 Disease of pericardium, unspecified: Secondary | ICD-10-CM

## 2013-12-22 DIAGNOSIS — R0609 Other forms of dyspnea: Secondary | ICD-10-CM

## 2013-12-22 DIAGNOSIS — I10 Essential (primary) hypertension: Secondary | ICD-10-CM

## 2013-12-22 DIAGNOSIS — R0683 Snoring: Secondary | ICD-10-CM

## 2013-12-22 MED ORDER — HYDROCHLOROTHIAZIDE 25 MG PO TABS: 25.0000 mg | ORAL_TABLET | Freq: Every day | ORAL | Status: DC

## 2013-12-22 NOTE — Progress Notes (Signed)
[address] [place], [place]  [address] Phone: [phone]([phone] Fax:  [phone](336) [phone]  Date:  12/22/2013   ID:  Caleb BunMyron T Slimp, DOB August 06, 1981, MRN [id number]  PCP:  No PCP Per Patient  Cardiologist:  Dr. Marca Anconaalton McLean     History of Present Illness: Caleb [name] is a 33 y.o. male with a past medical history of GERD. He was admitted 10/2013 with chest pain and dyspnea. He was noted to have moderate to large pericardial effusion on chest CT. He had diffuse ST elevation consistent with acute pericarditis. Reactive lymph nodes were noted in the mediastinum on his chest CT.  Echocardiogram was consistent with near tamponade.  He underwent pericardiocentesis with drainage of 650 cc of hemorrhagic fluid.  He was placed on high-dose ibuprofen, prednisone taper x1 week and colchicine.  Cultures, AFB smear and cytology were all negative.   He has had difficult to control BP.  He was last seen 12/14/13.  He had symptoms of recurrent pericarditis.  We found out that he had come off of colchicine.  This was resumed and I put him back on NSAIDs.  F/u echo did not demonstrate a significant effusion.  He returns for follow up.  He is feeling better. He denies chest pain, dyspnea, syncope, orthopnea, PND or edema.   Studies:  - Echo (11/02/13):  EF 45-50%, moderate pericardial effusion, grade 1 diastolic dysfunction, mild LAE, mild RAE, PASP 31  - Echo (11/03/13): EF 55-60%, trivial to small pericardial effusion  - (12/14/13):  Mod LVH, EF 50-55%, no RWMA, trivial Eff.   Recent Labs: 05/27/2013: ALT 36  11/02/2013: TSH 1.957  12/14/2013: Creatinine 0.9; Hemoglobin 12.8*; Potassium 4.0   CXR (12/15/13): IMPRESSION: No active cardiopulmonary disease   Wt Readings from Last 3 Encounters:  12/14/13 386 lb (175.088 kg)  11/12/13 378 lb 6.4 oz (171.641 kg)  11/01/13 380 lb (172.367 kg)     Past Medical History  Diagnosis Date  . HTN (hypertension)   . GERD (gastroesophageal reflux disease)     . LV dysfunction     a. 10/2013:  EF 45-50% with followup echo 55-60%.  . Cardiac tamponade     a. 10/2013: Acute pericarditis with cardiac tamponade s/p pericardiocentesis.  . Pericarditis     a. 10/2013: Acute pericarditis with cardiac tamponade s/p pericardiocentesis.  . Morbid obesity   . Enlarged pulmonary artery     a. Mild enlargement of pulmonary artery by CT 11/01/13.  . Abnormal CBC     a. 10/2013 during adm for pericarditis - elevated WBC, microcytic anemia, mild thrombocytopenia.  Marland Kitchen. Hx of echocardiogram     Echo (5/15): Mod LVH, EF 50-55%, normal wall motion, normal diastolic function, PASP normal, trivial effusion    Current Outpatient Prescriptions  Medication Sig Dispense Refill  . acetaminophen (TYLENOL) 500 MG tablet Take 500 mg by mouth every 6 (six) hours as needed.      Marland Kitchen. amLODipine (NORVASC) 10 MG tablet Take 1 tablet (10 mg total) by mouth daily.  30 tablet  6  . colchicine 0.6 MG tablet Take 1 tablet (0.6 mg total) by mouth 2 (two) times daily.  60 tablet  2  . hydrochlorothiazide (HYDRODIURIL) 25 MG tablet Take 25 mg by mouth daily.       Marland Kitchen. ibuprofen (ADVIL,MOTRIN) 800 MG tablet Take 1 tablet (800 mg total) by mouth 3 (three) times daily.  42 tablet  0  . metoprolol succinate (TOPROL-XL) 50 MG 24 hr tablet  Take 1 tablet (50 mg total) by mouth daily. Take with or immediately following a meal.  30 tablet  11   No current facility-administered medications for this visit.    Allergies:   Review of patient's allergies indicates no known allergies.   Social History:  The patient  reports that he has never smoked. He does not have any smokeless tobacco history on file. He reports that he does not drink alcohol or use illicit drugs.   Family History:  The patient's family history includes CAD in an other family member; Heart failure in an other family member.   ROS:  Please see the history of present illness.  He notes a history of snoring as well as daytime  hypersomnolence.   All other systems reviewed and negative.   PHYSICAL EXAM: VS:  BP 144/98  Pulse 78  Ht 5\' 10"  (1.778 m)  Wt 392 lb (177.81 kg)  BMI 56.25 kg/m2 Well nourished, well developed, in no acute distress HEENT: normal Neck:  I cannot assess JVD Cardiac:  normal S1, S2; RRR; no murmur? rub Lungs:  clear to auscultation bilaterally, no wheezing, rhonchi or rales Abd: soft, nontender, no hepatomegaly Ext: no edema Skin: warm and dry Neuro:  CNs 2-12 intact, no focal abnormalities noted  EKG:   NSR, HR 78, normal axis, nonspecific ST-T wave changes      ASSESSMENT AND PLAN:  1. Pericarditis:  Improved. We discussed the importance of remaining on colchicine for at least 90 days. He can stop his ibuprofen. 2. Hypertension:  Stop NSAID therapy as noted. Blood pressure remains uncontrolled. Resume HCTZ 25 mg daily. Check basic metabolic panel in one week. 3. Snoring: He likely has sleep apnea. Arrange sleep study. 4. Disposition: Follow up with me in 4-6 weeks.  Signed, Tereso NewcomerScott Brantleigh Mifflin, PA-C  12/22/2013 2:25 PM

## 2013-12-22 NOTE — Patient Instructions (Signed)
YOU WILL NEED TO SCHEDULE A SLEEP STUDY TO BE DONE AT [facility]  LAB WORK IN 1-2 WEEKS (BMET)  STOP THE IBUPROFEN  RESTART HCTZ 25 MG 1 TABLET DAILY  YOU WILL NEED A FOLLOW UP WITH SCOTT WEAVER,, PAC IN ABOUT 4-6 WEEKS

## 2013-12-29 ENCOUNTER — Other Ambulatory Visit: Payer: Self-pay | Admitting: *Deleted

## 2013-12-29 MED ORDER — COLCHICINE 0.6 MG PO TABS: 0.6000 mg | ORAL_TABLET | Freq: Two times a day (BID) | ORAL | Status: DC

## 2014-01-05 ENCOUNTER — Other Ambulatory Visit: Payer: PRIVATE HEALTH INSURANCE

## 2014-01-19 ENCOUNTER — Ambulatory Visit: Payer: PRIVATE HEALTH INSURANCE | Admitting: Physician Assistant

## 2014-01-26 ENCOUNTER — Telehealth: Payer: Self-pay

## 2014-01-26 NOTE — Telephone Encounter (Signed)
He really should be able to get HCTZ very cheap, [facility].  Even if his insurance does not cover it should be extremely cheap.

## 2014-02-13 ENCOUNTER — Ambulatory Visit (HOSPITAL_BASED_OUTPATIENT_CLINIC_OR_DEPARTMENT_OTHER): Payer: No Typology Code available for payment source | Attending: Physician Assistant | Admitting: Radiology

## 2014-02-13 VITALS — Ht 70.0 in | Wt 370.0 lb

## 2014-02-13 DIAGNOSIS — G4710 Hypersomnia, unspecified: Secondary | ICD-10-CM | POA: Diagnosis present

## 2014-02-13 DIAGNOSIS — I10 Essential (primary) hypertension: Secondary | ICD-10-CM

## 2014-02-13 DIAGNOSIS — R0683 Snoring: Secondary | ICD-10-CM

## 2014-02-13 DIAGNOSIS — G4730 Sleep apnea, unspecified: Secondary | ICD-10-CM | POA: Diagnosis present

## 2014-02-13 DIAGNOSIS — G4733 Obstructive sleep apnea (adult) (pediatric): Secondary | ICD-10-CM | POA: Diagnosis not present

## 2014-02-16 ENCOUNTER — Ambulatory Visit (INDEPENDENT_AMBULATORY_CARE_PROVIDER_SITE_OTHER): Payer: No Typology Code available for payment source | Admitting: Physician Assistant

## 2014-02-16 ENCOUNTER — Encounter: Payer: Self-pay | Admitting: Physician Assistant

## 2014-02-16 VITALS — BP 130/80 | HR 91 | Ht 70.0 in | Wt 386.0 lb

## 2014-02-16 DIAGNOSIS — I10 Essential (primary) hypertension: Secondary | ICD-10-CM | POA: Diagnosis not present

## 2014-02-16 DIAGNOSIS — R0989 Other specified symptoms and signs involving the circulatory and respiratory systems: Secondary | ICD-10-CM

## 2014-02-16 DIAGNOSIS — R0609 Other forms of dyspnea: Secondary | ICD-10-CM

## 2014-02-16 DIAGNOSIS — I319 Disease of pericardium, unspecified: Secondary | ICD-10-CM | POA: Diagnosis not present

## 2014-02-16 DIAGNOSIS — R0683 Snoring: Secondary | ICD-10-CM

## 2014-02-16 MED ORDER — HYDROCHLOROTHIAZIDE 25 MG PO TABS: 25.0000 mg | ORAL_TABLET | Freq: Every day | ORAL | Status: DC

## 2014-02-16 NOTE — Progress Notes (Signed)
Cardiology Office Note   Date:  02/16/2014   ID:  Caleb BunMyron T Lecker, DOB 05-03-1981, MRN [id number]  PCP:  No PCP Per Patient  Cardiologist:  Dr. Marca Anconaalton McLean     History of Present Illness: Caleb [name] is a 33 y.o. male with a history of HTN, GERD. He was admitted 10/2013 with large pericardial effusion and near tamponade physiology (by echo) in the setting of acute pericarditis (diffuse STE on ECG).  He underwent pericardiocentesis with drainage of 650 cc of hemorrhagic fluid.  Cultures, AFB smear and cytology were all negative.   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  Symptoms resolved with resuming Colchicine.  He returns for follow up.  He denies any significant chest pain.  He has an occasional pain that lasts only seconds.  He takes an occasional ibuprofen.  Denies dyspnea, orthopnea, PND, edema.  Denies syncope.  No pleuritic chest pain or discomfort with lying flat.  Unfortunately, he stopped taking all medications 3 weeks ago due to cost.    Studies:  - Echo (11/02/13):  EF 45-50%, moderate pericardial effusion, grade 1 diastolic dysfunction, mild LAE, mild RAE, PASP 31  - Echo (11/03/13): EF 55-60%, trivial to small pericardial effusion  - Echo (12/14/13):  Mod LVH, EF 50-55%, no RWMA, trivial Eff.   Recent Labs: 05/27/2013: ALT 36  11/02/2013: TSH 1.957  12/14/2013: Creatinine 0.9; Hemoglobin 12.8*; Potassium 4.0   CXR (12/15/13): IMPRESSION: No active cardiopulmonary disease   Wt Readings from Last 3 Encounters:  02/16/14 386 lb (175.088 kg)  02/13/14 370 lb (167.831 kg)  12/22/13 392 lb (177.81 kg)     Past Medical History  Diagnosis Date  . HTN (hypertension)   . GERD (gastroesophageal reflux disease)   . LV dysfunction     a. 10/2013:  EF 45-50% with followup echo 55-60%.  . Cardiac tamponade 10/2013  . Pericarditis     a. 10/2013: Acute pericarditis with cardiac tamponade s/p pericardiocentesis.  . Morbid obesity   .  Enlarged pulmonary artery     a. Mild enlargement of pulmonary artery by CT 11/01/13.  Marland Kitchen. Hx of echocardiogram     Echo (12/14/13):  Mod LVH, EF 50-55%, no RWMA, trivial Eff.    Current Outpatient Prescriptions  Medication Sig Dispense Refill  . amLODipine (NORVASC) 10 MG tablet Take 1 tablet (10 mg total) by mouth daily.  30 tablet  6  . colchicine 0.6 MG tablet Take 1 tablet (0.6 mg total) by mouth 2 (two) times daily.  60 tablet  1  . hydrochlorothiazide (HYDRODIURIL) 25 MG tablet Take 1 tablet (25 mg total) by mouth daily.  90 tablet  3  . metoprolol succinate (TOPROL-XL) 50 MG 24 hr tablet Take 1 tablet (50 mg total) by mouth daily. Take with or immediately following a meal.  30 tablet  11   No current facility-administered medications for this visit.    Allergies:   Review of patient's allergies indicates no known allergies.   Social History:  The patient  reports that he has never smoked. He does not have any smokeless tobacco history on file. He reports that he does not drink alcohol or use illicit drugs.   Family History:  The patient's family history includes CAD in an other family member; Heart failure in an other family member.   ROS:  Please see the history of present illness.   All other systems reviewed and negative.   PHYSICAL EXAM: VS:  BP 130/80  Pulse 91  Ht 5\' 10"  (1.778 m)  Wt 386 lb (175.088 kg)  BMI 55.39 kg/m2 Well nourished, well developed, in no acute distress HEENT: normal Neck:  I cannot assess JVD Cardiac:  normal S1, S2; RRR; no murmur  Lungs:  clear to auscultation bilaterally, no wheezing, rhonchi or rales Abd: soft, nontender, no hepatomegaly Ext: no edema Skin: warm and dry Neuro:  CNs 2-12 intact, no focal abnormalities noted  EKG:   NSR, HR 91, normal axis, NSSTTW changes      ASSESSMENT AND PLAN:  1. Pericarditis:  He stopped taking medications about 3 weeks ago.  He was basically on colchicine for at least 90 days.  Given cost issues and  no recurrent symptoms over the past 3 weeks, will hold off on resuming.  If he has recurrent pericarditis, he will need Colchicine for 6 mos total.  Luckily, generic Colchicine is available again.   2. Hypertension:  BP is ok today.  However, I am not certain that his BP will remain controlled off of medication.  I have asked him to continue HCTZ 25 mg QD.  He will monitor his BP at home.  He knows to contact us if BP is > 140/90.   3. Snoring:  He was told by the tech that his study was + for OSA.  I have not received the report back yet.   4. Disposition: Follow up with me or Dr. Marca Anconaalton McLean in 6 mos.   Signed, Brynda RimScott Kalvin Buss, PA-C, MHS 02/16/2014 3:54 PM    [facility] [address], [place], [place]  [address] Phone: [phone](336) [phone]; Fax: [phone](336) [phone]

## 2014-02-16 NOTE — Patient Instructions (Addendum)
Your physician wants you to follow-up in: 6 months with Dr. Shirlee LatchMclean or PA Tereso NewcomerScott Weaver You will receive a reminder letter in the mail two months in advance. If you don't receive a letter, please call our office to schedule the follow-up appointment.  Your physician recommends that you continue on your current medications HCTZ 25 MG 1 TAB DAILY  Your physician has recommended you make the following change in your medication:  1. Stop Norvasc  2. Stop Toprol 3. Stop Colchicine

## 2014-02-22 DIAGNOSIS — G4730 Sleep apnea, unspecified: Secondary | ICD-10-CM

## 2014-02-22 DIAGNOSIS — G4710 Hypersomnia, unspecified: Secondary | ICD-10-CM

## 2014-02-22 NOTE — Sleep Study (Signed)
   NAME: Caleb BunMyron T Weisel DATE OF BIRTH:  Apr 20, 1981 MEDICAL RECORD NUMBER [id number]  LOCATION: [facility]  PHYSICIAN: Barbaraann ShareCLANCE,Timonthy Hovater M  DATE OF STUDY: 02/13/2014  SLEEP STUDY TYPE: Nocturnal Polysomnogram               REFERRING PHYSICIAN: Tereso NewcomerWeaver, Scott T, PA-C  INDICATION FOR STUDY: Hypersomnia with sleep apnea  EPWORTH SLEEPINESS SCORE:  8 HEIGHT: 5\' 10"  (177.8 cm)  WEIGHT: 370 lb (167.831 kg)    Body mass index is 53.09 kg/(m^2).  NECK SIZE: 21.5 in.  MEDICATIONS: Reviewed in the sleep record  SLEEP ARCHITECTURE: The patient had a total sleep time of 269 minutes with very little slow-wave sleep or REM noted. Sleep onset latency was prolonged at 97 minutes, and REM onset was normal. Sleep efficiency was 53% during the diagnostic portion of the study, and 82% during the titration portion.  RESPIRATORY DATA: The patient underwent a split night protocol where he was found to have 145 obstructive events in the first 124 minutes of sleep. This gave him an AHI of 70 events per hour during the diagnostic portion of the study. The events occurred in all body positions, and there was loud snoring noted throughout. By protocol, he was fitted with a ResMed medium air fit F10 full face mask, and his pressure was increased in order to treat both snoring and obstructive events. He was found to have an optimal pressure of 12 cm of water, with good control noted.  OXYGEN DATA: There was transient oxygen desaturation as low as 80% with the patient's obstructive events  CARDIAC DATA: Rare PAC noted  MOVEMENT/PARASOMNIA: No significant limb movements or abnormal behaviors were seen.  IMPRESSION/ RECOMMENDATION:    1) split-night study reveals severe obstructive sleep apnea, with an AHI of 70 events per hour and oxygen desaturation as low as 80% during the diagnostic portion of the study. The patient was then fitted with a ResMed medium air fit F10 full face mask, and found to  have optimal pressure of 12 cm of water. He should also be encouraged to work aggressively on weight loss.  2) rare PAC noted, but no clinically significant arrhythmias were seen.     Barbaraann ShareLANCE,Jasmine Maceachern M Diplomate, American Board of Sleep Medicine  ELECTRONICALLY SIGNED ON:  02/22/2014, 10:52 AM [facility] PH: [phone]   FX: [phone] ACCREDITED BY THE AMERICAN ACADEMY OF SLEEP MEDICINE

## 2014-02-23 NOTE — Sleep Study (Signed)
Sleep study demonstrates severe sleep apnea. Please refer to Dr. Shelle Ironlance for treatment of sleep apnea. Tereso NewcomerScott Bradyn Vassey, PA-C   02/23/2014 4:49 PM

## 2014-03-24 ENCOUNTER — Telehealth: Payer: Self-pay | Admitting: Physician Assistant

## 2014-03-24 DIAGNOSIS — G4733 Obstructive sleep apnea (adult) (pediatric): Secondary | ICD-10-CM

## 2014-03-24 NOTE — Telephone Encounter (Signed)
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  Please call

## 2014-03-25 NOTE — Telephone Encounter (Signed)
lmptcb to go over recommendation about referring pt to Dr. 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 I advised pt that Bing NeighborsScott W. PA is on vacation and will be back 04/04/14, that I will d/w PA then about the need for the letter. Pt said ok and thank you.

## 2014-05-16 ENCOUNTER — Encounter (INDEPENDENT_AMBULATORY_CARE_PROVIDER_SITE_OTHER): Payer: Self-pay

## 2014-05-16 ENCOUNTER — Ambulatory Visit (INDEPENDENT_AMBULATORY_CARE_PROVIDER_SITE_OTHER): Payer: PRIVATE HEALTH INSURANCE | Admitting: Pulmonary Disease

## 2014-05-16 ENCOUNTER — Encounter: Payer: Self-pay | Admitting: Pulmonary Disease

## 2014-05-16 VITALS — BP 128/82 | HR 81 | Temp 99.1°F | Ht 69.0 in | Wt 396.8 lb

## 2014-05-16 DIAGNOSIS — G4733 Obstructive sleep apnea (adult) (pediatric): Secondary | ICD-10-CM

## 2014-05-16 NOTE — Assessment & Plan Note (Signed)
The patient has severe obstructive sleep apnea by his recent study, and will need aggressive treatment with CPAP while he is working on weight loss. I will start him out at a moderate pressure level on the automatic setting, and will see him back in 8 weeks to check on his progress. He knows to call if he is having issues with tolerance.

## 2014-05-16 NOTE — Progress Notes (Signed)
Subjective:    Patient ID: Maryruth BunMyron T Radick, male    DOB: 16-Aug-1980, 33 y.o.   MRN: [id number]  HPI The patient is a 33 year old male who I've been asked to see for management of obstructive sleep apnea.  He has had a recent sleep study which showed an AHI of 70 events per hour, with resolution of his events with a CPAP pressure of 12. The patient is known to be a loud snorer, but his bed partner cannot comment about his breathing pattern because he sleeps prone. He does have 1-2 awakenings at night, and does not feel rested in the mornings upon arising. He also has a severe chronic headache in the mornings upon arising. The patient stays very busy at work, and does not have an opportunity to experience sleepiness. He does note increased sleep pressure in the evenings while trying to watch television or movies. He denies any sleepiness with driving. The patient's weight is up 20 pounds over the last one year, and his Epworth score today is 12.   Sleep Questionnaire What time do you typically go to bed?( Between what hours) 12:30 AM 12:30 AM at 1515 on 05/16/14 by Tommie SamsMindy S Silva, CMA How long does it take you to fall asleep? 1-2 hrs 1-2 hrs at 1515 on 05/16/14 by Tommie SamsMindy S Silva, CMA How many times during the night do you wake up? 2 2 at 1515 on 05/16/14 by Tommie SamsMindy S Silva, CMA What time do you get out of bed to start your day? [id number] 0615 at 1515 on 05/16/14 by Tommie SamsMindy S Silva, CMA Do you drive or operate heavy machinery in your occupation? Yes Yes at 1515 on 05/16/14 by Tommie SamsMindy S Silva, CMA How much has your weight changed (up or down) over the past two years? (In pounds) 20 lb (9.072 kg) 20 lb (9.072 kg) at 1515 on 05/16/14 by Tommie SamsMindy S Silva, CMA Have you ever had a sleep study before? Yes Yes at 1515 on 05/16/14 by Tommie SamsMindy S Silva, CMA If yes, [facility] at 1515 on 05/16/14 by Tommie SamsMindy S Silva, CMA If yes, date of study? 02/2014 02/2014 at 1515 on 05/16/14 by Tommie SamsMindy S Silva, CMA  Do you currently use CPAP? No No at 1515 on 05/16/14 by Tommie SamsMindy S Silva, CMA Do you wear oxygen at any time? No No at 1515 on 05/16/14 by Tommie SamsMindy S Silva, CMA   Review of Systems  Constitutional: Negative for fever and unexpected weight change.  HENT: Negative for congestion, dental problem, ear pain, nosebleeds, postnasal drip, rhinorrhea, sinus pressure, sneezing, sore throat and trouble swallowing.   Eyes: Negative for redness and itching.  Respiratory: Negative for cough, chest tightness, shortness of breath and wheezing.   Cardiovascular: Negative for palpitations and leg swelling.  Gastrointestinal: Negative for nausea and vomiting.  Genitourinary: Negative for dysuria.  Musculoskeletal: Negative for joint swelling.  Skin: Negative for rash.  Neurological: Negative for headaches.  Hematological: Does not bruise/bleed easily.  Psychiatric/Behavioral: Negative for dysphoric mood. The patient is not nervous/anxious.        Objective:   Physical Exam Constitutional: morbidly obese male, no acute distress  HENT:  Nares patent without discharge, +turbinate hypertrophy  Oropharynx without exudate, palate and uvula are thick and elongated, +tonsillar hypertrophy  Eyes:  Perrla, eomi, no scleral icterus  Neck:  No JVD, no TMG  Cardiovascular:  Normal rate, regular rhythm, no rubs or gallops.  No murmurs        Intact  distal pulses  Pulmonary :  Normal breath sounds, no stridor or respiratory distress   No rales, rhonchi, or wheezing  Abdominal:  Soft, nondistended, bowel sounds present.  No tenderness noted.   Musculoskeletal:  No lower extremity edema noted.  Lymph Nodes:  No cervical lymphadenopathy noted  Skin:  No cyanosis noted  Neurologic:  Alert, appropriate, moves all 4 extremities without obvious deficit.         Assessment & Plan:

## 2014-05-16 NOTE — Patient Instructions (Signed)
Will start on cpap at a moderate pressure level.  Please call if having tolerance issues.  Work on weight loss followup with me again in 8 weeks.  

## 2014-07-11 ENCOUNTER — Ambulatory Visit: Payer: PRIVATE HEALTH INSURANCE | Admitting: Pulmonary Disease

## 2014-07-21 ENCOUNTER — Encounter (HOSPITAL_COMMUNITY): Payer: Self-pay | Admitting: Cardiovascular Disease

## 2014-08-20 ENCOUNTER — Emergency Department (INDEPENDENT_AMBULATORY_CARE_PROVIDER_SITE_OTHER)
Admission: EM | Admit: 2014-08-20 | Discharge: 2014-08-20 | Disposition: A | Discharge status: Home / Self Care | Payer: No Typology Code available for payment source | Source: Home / Self Care | Attending: Emergency Medicine | Admitting: Emergency Medicine

## 2014-08-20 ENCOUNTER — Encounter (HOSPITAL_COMMUNITY): Payer: Self-pay | Admitting: Emergency Medicine

## 2014-08-20 DIAGNOSIS — M25561 Pain in right knee: Secondary | ICD-10-CM

## 2014-08-20 MED ORDER — INDOMETHACIN 25 MG PO CAPS: 25.0000 mg | ORAL_CAPSULE | Freq: Three times a day (TID) | ORAL | Status: DC | PRN

## 2014-08-20 NOTE — ED Notes (Signed)
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

## 2014-08-20 NOTE — Discharge Instructions (Signed)

## 2014-08-20 NOTE — ED Provider Notes (Signed)
CSN: [id number]     Arrival date & time 08/20/14  1801 History   First MD Initiated Contact with Patient 08/20/14 1851     Chief Complaint  Patient presents with  . 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 No injury, fever, or hx of gout. No previous surgery. Reports limited improvement with use of ibuprofen at home.   Patient is a 34 y.o. male presenting with leg pain. The history is provided by the patient.  Leg Pain Location:  Knee Time since incident:  2 days Injury: no   Knee location:  R knee Pain details:    Quality:  Aching   Radiates to:  R leg   Severity:  Mild   Onset quality:  Gradual   Duration:  2 days   Progression:  Waxing and waning Chronicity:  New Dislocation: no   Prior injury to area:  No   Past Medical History  Diagnosis Date  . HTN (hypertension)   . GERD (gastroesophageal reflux disease)   . LV dysfunction     a. 10/2013:  EF 45-50% with followup echo 55-60%.  . Cardiac tamponade 10/2013  . Pericarditis     a. 10/2013: Acute pericarditis with cardiac tamponade s/p pericardiocentesis.  . Morbid obesity   . Enlarged pulmonary artery     a. Mild enlargement of pulmonary artery by CT 11/01/13.  Marland Kitchen. Hx of echocardiogram     Echo (12/14/13):  Mod LVH, EF 50-55%, no RWMA, trivial Eff.   Past Surgical History  Procedure Laterality Date  . Fluid drained from heart    . Pericardial tap N/A 11/02/2013    Procedure: PERICARDIAL TAP;  Surgeon: Lennette Biharihomas A Kelly, MD;  [facility];  Service: Cardiovascular;  Laterality: N/A;   Family History  Problem Relation Age of Onset  . CAD      Aunt with CABG  . Heart failure Father   . Heart attack Father   . Kidney cancer Father   . Pancreatic cancer Paternal Grandmother    History  Substance Use Topics  . Smoking status:  Former Smoker -- 1.00 packs/day for 3 years    Types: Cigars, Cigarettes    Quit date: 01/14/2014  . Smokeless tobacco: Not on file     Comment: 1 cigar daily  . Alcohol Use: Yes     Comment: occas    Review of Systems  All other systems reviewed and are negative.   Allergies  Review of patient's allergies indicates no known allergies.  Home Medications   Prior to Admission medications   Medication Sig Start Date End Date Taking? Authorizing Provider  hydrochlorothiazide (HYDRODIURIL) 25 MG tablet Take 1 tablet (25 mg total) by mouth daily. 02/16/14   Beatrice LecherScott T Weaver, PA-C  indomethacin (INDOCIN) 25 MG capsule Take 1 capsule (25 mg total) by mouth 3 (three) times daily as needed for mild pain or moderate pain. 08/20/14   Jess BartersJennifer Lee H Emmylou Bieker, PA   BP 133/77 mmHg  Pulse 92  Temp(Src) 98 F (36.7 C) (Oral)  Resp 18  SpO2 98% Physical Exam  Constitutional: He is oriented to person, place, and time. He appears well-developed and well-nourished.  +morbid obesity  HENT:  Head: Normocephalic and atraumatic.  Cardiovascular: Normal rate.   Pulmonary/Chest: Effort normal.  Musculoskeletal: Normal range of motion.  Right knee: He exhibits swelling and effusion. He exhibits normal range of motion, no ecchymosis, no deformity, no laceration, no erythema, no LCL laxity, normal patellar mobility, no bony tenderness and no MCL laxity. No tenderness found.  No reproducible tenderness on exam. +Small effusion No warmth or erythema Normal ROM No calf pain or swelling  Neurological: He is alert and oriented to person, place, and time.  Skin: Skin is warm and dry. No rash noted. No erythema.  Psychiatric: He has a normal mood and affect. His behavior is normal.  Nursing note and vitals reviewed.   ED Course  Procedures (including critical care time) Labs Review Labs Reviewed - No data to display  Imaging Review No results found.   MDM   1. Right knee pain    I suspect  patient has some mild right knee joint inflammation secondary to a combination of repetitive use and morbid obesity. No indication of gout or septic arthritis. Advised to consider weight loss. Will prescribe indocin for pain and advise PCP or orthopedic follow up if symptoms do not improve.     Ria Clock, [place] 08/20/14 [id number]

## 2014-09-17 ENCOUNTER — Emergency Department (INDEPENDENT_AMBULATORY_CARE_PROVIDER_SITE_OTHER)
Admission: EM | Admit: 2014-09-17 | Discharge: 2014-09-17 | Disposition: A | Discharge status: Home / Self Care | Payer: No Typology Code available for payment source | Source: Home / Self Care | Attending: Family Medicine | Admitting: Family Medicine

## 2014-09-17 ENCOUNTER — Encounter (HOSPITAL_COMMUNITY): Payer: Self-pay | Admitting: Family Medicine

## 2014-09-17 DIAGNOSIS — J309 Allergic rhinitis, unspecified: Secondary | ICD-10-CM

## 2014-09-17 DIAGNOSIS — R030 Elevated blood-pressure reading, without diagnosis of hypertension: Secondary | ICD-10-CM

## 2014-09-17 DIAGNOSIS — J020 Streptococcal pharyngitis: Secondary | ICD-10-CM

## 2014-09-17 LAB — POCT RAPID STREP A: Streptococcus, Group A Screen (Direct): POSITIVE — AB

## 2014-09-17 MED ORDER — IBUPROFEN 800 MG PO TABS: 800.0000 mg | ORAL_TABLET | Freq: Once | ORAL | Status: DC

## 2014-09-17 MED ORDER — AMOXICILLIN 500 MG PO CAPS: 500.0000 mg | ORAL_CAPSULE | Freq: Three times a day (TID) | ORAL | Status: DC

## 2014-09-17 MED ORDER — IPRATROPIUM BROMIDE 0.06 % NA SOLN: 2.0000 | Freq: Four times a day (QID) | NASAL | Status: DC

## 2014-09-17 MED ORDER — IBUPROFEN 800 MG PO TABS
ORAL_TABLET | ORAL | Status: AC
  Filled 2014-09-17: qty 1

## 2014-09-17 MED ORDER — IBUPROFEN 800 MG PO TABS
800.0000 mg | ORAL_TABLET | Freq: Once | ORAL | Status: AC
  Administered 2014-09-17: 800 mg via ORAL

## 2014-09-17 NOTE — Discharge Instructions (Signed)
You have strep throat Take the antibiotic for 10 days Please use ibuprofen for pain Please use the atrovent for nasal congestion

## 2014-09-17 NOTE — ED Notes (Signed)
C/O sore throat, difficulty swallowing, chills since yesterday with difficulty sleeping.  Has taken Benadryl without relief.  Denies cough.  Tonsils enlarged - L>R.

## 2014-09-17 NOTE — ED Provider Notes (Signed)
CSN: [id number]     Arrival date & time 09/17/14  0901 History   First MD Initiated Contact with Patient 09/17/14 0915     Chief Complaint  Patient presents with  . Sore Throat   (Consider location/radiation/quality/duration/timing/severity/associated sxs/prior Treatment) HPI   Sore throat: started yesterday. Getting worse. Unable to sleep last night. Worse w/ swallowing. Able to swallow w/o dysphagia. No airway compromise. Associated w/ body aches and subjective fevers, runny nose. Tried benadryl and cough drops w/ mild improvement. Rinorrhea and congestion at baseline for pt. chrnonic ongoing problem for pt   HTN: previous cardiac history of tamponade adn HTN. Stopped taking 05/2014. No further CP, palpitations.     Past Medical History  Diagnosis Date  . HTN (hypertension)   . GERD (gastroesophageal reflux disease)   . LV dysfunction     a. 10/2013:  EF 45-50% with followup echo 55-60%.  . Cardiac tamponade 10/2013  . Pericarditis     a. 10/2013: Acute pericarditis with cardiac tamponade s/p pericardiocentesis.  . Morbid obesity   . Enlarged pulmonary artery     a. Mild enlargement of pulmonary artery by CT 11/01/13.  Marland Kitchen. Hx of echocardiogram     Echo (12/14/13):  Mod LVH, EF 50-55%, no RWMA, trivial Eff.   Past Surgical History  Procedure Laterality Date  . Fluid drained from heart    . Pericardial tap N/A 11/02/2013    Procedure: PERICARDIAL TAP;  Surgeon: Lennette Biharihomas A Kelly, MD;  [facility];  Service: Cardiovascular;  Laterality: N/A;   Family History  Problem Relation Age of Onset  . CAD      Aunt with CABG  . Heart failure Father   . Heart attack Father   . Kidney cancer Father   . Pancreatic cancer Paternal Grandmother    History  Substance Use Topics  . Smoking status: Current Some Day Smoker -- 0.00 packs/day for 3 years    Types: Cigars, Cigarettes    Last Attempt to Quit: 01/14/2014  . Smokeless tobacco: Not on file     Comment: 1 cigar daily  . Alcohol  Use: Yes     Comment: occasional    Review of Systems Per HPI with all other pertinent systems negative.   Allergies  Review of patient's allergies indicates no known allergies.  Home Medications   Prior to Admission medications   Medication Sig Start Date End Date Taking? Authorizing Provider  amoxicillin (AMOXIL) 500 MG capsule Take 1 capsule (500 mg total) by mouth 3 (three) times daily. 09/17/14   Ozella Rocksavid J Merrell, MD  hydrochlorothiazide (HYDRODIURIL) 25 MG tablet Take 1 tablet (25 mg total) by mouth daily. 02/16/14   Beatrice LecherScott T Weaver, PA-C  indomethacin (INDOCIN) 25 MG capsule Take 1 capsule (25 mg total) by mouth 3 (three) times daily as needed for mild pain or moderate pain. 08/20/14   Mathis FareJennifer Lee H Presson, PA  ipratropium (ATROVENT) 0.06 % nasal spray Place 2 sprays into both nostrils 4 (four) times daily. 09/17/14   Ozella Rocksavid J Merrell, MD   BP 140/82 mmHg  Pulse 98  Temp(Src) 99.1 F (37.3 C) (Oral)  Resp 24  SpO2 98% Physical Exam  Constitutional: He is oriented to person, place, and time. He appears well-developed and well-nourished.  HENT:  Head: Normocephalic and atraumatic.  Nasal speech Tonsils 2+ w/ exudate  Eyes: EOM are normal. Pupils are equal, round, and reactive to light.  Neck: Normal range of motion. Neck supple.  Cardiovascular: Normal rate,  normal heart sounds and intact distal pulses.   No murmur heard. Pulmonary/Chest: Effort normal and breath sounds normal.  Abdominal: Soft. Bowel sounds are normal.  Musculoskeletal: Normal range of motion. He exhibits no edema or tenderness.  Neurological: He is alert and oriented to person, place, and time.  Skin: Skin is warm.  Psychiatric: He has a normal mood and affect. His behavior is normal. Judgment and thought content normal.    ED Course  Procedures (including critical care time) Labs Review Labs Reviewed  POCT RAPID STREP A ([facility]RG CARE ONLY) - Abnormal; Notable for the following:    Streptococcus, Group A  Screen (Direct) POSITIVE (*)    All other components within normal limits    Imaging Review No results found.   MDM   1. Strep pharyngitis   2. Allergic rhinitis, unspecified allergic rhinitis type   3. Prehypertension      Rapid strep + Start Amox Motrin 800 mg po in office Atrovent for congestion prehypertensive today. Monitor. No need for restarting medications.  Precautions given and all questions answered  Shelly Flatten, MD Family Medicine 09/17/2014, 9:32 AM    Ozella Rocks, MD 09/17/14 [phone]

## 2014-12-09 ENCOUNTER — Ambulatory Visit: Payer: No Typology Code available for payment source | Admitting: Cardiology

## 2014-12-21 ENCOUNTER — Ambulatory Visit (INDEPENDENT_AMBULATORY_CARE_PROVIDER_SITE_OTHER): Payer: No Typology Code available for payment source | Admitting: Physician Assistant

## 2014-12-21 ENCOUNTER — Encounter: Payer: Self-pay | Admitting: Physician Assistant

## 2014-12-21 VITALS — BP 138/60 | HR 100 | Ht 69.0 in | Wt >= 6400 oz

## 2014-12-21 DIAGNOSIS — I10 Essential (primary) hypertension: Secondary | ICD-10-CM

## 2014-12-21 DIAGNOSIS — I319 Disease of pericardium, unspecified: Secondary | ICD-10-CM | POA: Diagnosis not present

## 2014-12-21 DIAGNOSIS — G4733 Obstructive sleep apnea (adult) (pediatric): Secondary | ICD-10-CM | POA: Diagnosis not present

## 2014-12-21 NOTE — Progress Notes (Signed)
Cardiology Office Note   Date:  12/21/2014   ID:  Caleb BunMyron T Davanzo, DOB 13-Jan-1981, MRN [id number]  PCP:  No PCP Per Patient  Cardiologist:  Dr. Marca Anconaalton McLean     Chief Complaint  Patient presents with  . Follow-up    Hx of Pericarditis     History of Present Illness: Caleb [name] is a 34 y.o. male with a hx of HTN, GERD. He was admitted 10/2013 with large pericardial effusion and near tamponade physiology (by echo) in the setting of acute pericarditis (diffuse STE on ECG). He underwent pericardiocentesis with drainage of 650 cc of hemorrhagic fluid. Cultures, AFB smear and cytology were all negative.   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 Symptoms resolved with resuming Colchicine. Last seen here in 02/2014.  He was off Colchicine due to cost.  He had no recurrent symptoms of pericarditis.  Thus, I left him off of this medication.  He returns for FU.  He is overall doing well.  He has occasional L shoulder pain.  He has some substernal chest pain with palpation.  He denies exertional chest pain, pleuritic chest pain or pain with lying supine. No significant DOE.  He denies syncope, orthopnea, PND, edema.     Studies/Reports Reviewed Today:  - Echo (11/02/13): EF 45-50%, moderate pericardial effusion, grade 1 diastolic dysfunction, mild LAE, mild RAE, PASP 31 - Echo (11/03/13): EF 55-60%, trivial to small pericardial effusion - Echo (12/14/13): Mod LVH, EF 50-55%, no RWMA, trivial Eff.   Past Medical History  Diagnosis Date  . HTN (hypertension)   . GERD (gastroesophageal reflux disease)   . LV dysfunction     a. 10/2013:  EF 45-50% with followup echo 55-60%.  . Cardiac tamponade 10/2013  . Pericarditis     a. 10/2013: Acute pericarditis with cardiac tamponade s/p pericardiocentesis.  . Morbid obesity   . Enlarged pulmonary artery     a. Mild enlargement of pulmonary artery by CT 11/01/13.  Marland Kitchen. Hx of echocardiogram     Echo  (12/14/13):  Mod LVH, EF 50-55%, no RWMA, trivial Eff.    Past Surgical History  Procedure Laterality Date  . Fluid drained from heart    . Pericardial tap N/A 11/02/2013    Procedure: PERICARDIAL TAP;  Surgeon: Lennette Biharihomas A Kelly, MD;  [facility];  Service: Cardiovascular;  Laterality: N/A;     Current Outpatient Prescriptions  Medication Sig Dispense Refill  . hydrochlorothiazide (HYDRODIURIL) 25 MG tablet Take 1 tablet (25 mg total) by mouth daily. 30 tablet 11   No current facility-administered medications for this visit.    Allergies:   Review of patient's allergies indicates no known allergies.    Social History:  The patient  reports that he has been smoking Cigars and Cigarettes.  He has been smoking about 0.00 packs per day for the past 3 years. He does not have any smokeless tobacco history on file. He reports that he drinks alcohol. He reports that he does not use illicit drugs.   Family History:  The patient's family history includes CAD in an other family member; Heart attack in his father; Heart failure in his father; Kidney cancer in his father; Pancreatic cancer in his paternal grandmother. There is no history of Stroke.    ROS:   Please see the history of present illness.   Review of Systems  Cardiovascular: Positive for chest pain.  Musculoskeletal: Positive for myalgias.  All other  systems reviewed and are negative.    PHYSICAL EXAM: VS:  BP 138/60 mmHg  Pulse 100  Ht 5\' 9"  (1.753 m)  Wt 401 lb (181.892 kg)  BMI 59.19 kg/m2    Wt Readings from Last 3 Encounters:  12/21/14 401 lb (181.892 kg)  05/16/14 396 lb 12.8 oz (179.987 kg)  02/16/14 386 lb (175.088 kg)     GEN: Well nourished, well developed, in no acute distress HEENT: normal Neck: no JVD,  no masses Cardiac:  Normal S1/S2, RRR; no murmur ,  no rubs or gallops, no edema  Respiratory:  clear to auscultation bilaterally, no wheezing, rhonchi or rales. GI: soft, nontender, nondistended, +  BS MS: no deformity or atrophy Skin: warm and dry  Neuro:  CNs II-XII intact, Strength and sensation are intact Psych: Normal affect   EKG:  EKG is ordered today.  It demonstrates:   NSR, HR 100, NSSTTW changes, no change from prior tracing.    Recent Labs: No results found for requested labs within last 365 days.    Lipid Panel No results found for: CHOL, TRIG, HDL, CHOLHDL, VLDL, LDLCALC, LDLDIRECT    ASSESSMENT AND PLAN:  Pericarditis  No recurrent symptoms. He has been off Colchicine for several months.    Essential hypertension, benign  Controlled. Check BMET today.  OSA (obstructive sleep apnea)  FU with Dr. Shelle Ironlance.  We will try to arrange.   Obesity We discussed the importance of weight loss and different strategies for losing weight.   Current medicines are reviewed at length with the patient today.  Concerns regarding medicines are as outlined above.  The following changes have been made:    None    Labs/ tests ordered today include:  Orders Placed This Encounter  Procedures  . Basic metabolic panel  . EKG 12-Lead    Disposition:   FU with me 6 mos.    Signed, Brynda RimScott Weaver, PA-C, MHS 12/21/2014 4:55 PM    [facility] [address], [place], [place]  [address] Phone: [phone]; Fax: [phone]

## 2014-12-21 NOTE — Patient Instructions (Signed)
Medication Instructions:  No changes.  Labwork: BMET today (Dx HTN)  Testing/Procedures: None   Follow-Up: Schedule appointment with Tereso NewcomerScott Kahliya Fraleigh, PA-C in 6 months.  Any Other Special Instructions Will Be Listed Below (If Applicable). We will call Dr. Teddy Spikelance's office to arrange follow up for sleep apnea.

## 2014-12-22 ENCOUNTER — Telehealth: Payer: Self-pay | Admitting: *Deleted

## 2014-12-22 DIAGNOSIS — I10 Essential (primary) hypertension: Secondary | ICD-10-CM

## 2014-12-22 DIAGNOSIS — R739 Hyperglycemia, unspecified: Secondary | ICD-10-CM

## 2014-12-22 LAB — BASIC METABOLIC PANEL
BUN: 12 mg/dL (ref 6–23)
CO2: 29 mEq/L (ref 19–32)
Calcium: 9 mg/dL (ref 8.4–10.5)
Chloride: 102 mEq/L (ref 96–112)
Creatinine, Ser: 1.07 mg/dL (ref 0.40–1.50)
GFR: 103.39 mL/min (ref >60.00)
Glucose, Bld: 185 mg/dL — ABNORMAL HIGH (ref 70–99)
POTASSIUM: 3.7 meq/L (ref 3.5–5.1)
SODIUM: 137 meq/L (ref 135–145)

## 2014-12-22 NOTE — Telephone Encounter (Signed)
Ptcb and has been notified of lab results and being referred to PCP dx hyperglycemia. Repeat labs 5/27. Pt agreeable to plan of care and all instructions.

## 2014-12-22 NOTE — Telephone Encounter (Signed)
lmptcb to go over results. Per Bing NeighborsScott W. PA refer to PCP.

## 2015-01-05 ENCOUNTER — Ambulatory Visit: Payer: No Typology Code available for payment source | Admitting: Pulmonary Disease

## 2015-01-06 ENCOUNTER — Encounter: Payer: Self-pay | Admitting: Pulmonary Disease

## 2015-01-06 ENCOUNTER — Ambulatory Visit (INDEPENDENT_AMBULATORY_CARE_PROVIDER_SITE_OTHER): Payer: No Typology Code available for payment source | Admitting: Pulmonary Disease

## 2015-01-06 VITALS — BP 142/80 | HR 80 | Temp 98.7°F | Ht 69.0 in | Wt >= 6400 oz

## 2015-01-06 DIAGNOSIS — G4733 Obstructive sleep apnea (adult) (pediatric): Secondary | ICD-10-CM

## 2015-01-06 NOTE — Assessment & Plan Note (Signed)
The patient has severe obstructive sleep apnea, and unfortunately is not treating this currently. He was unable to afford the C Pap machine since his insurance does not cover it, and it is essential given his other medical issues that we get him on appropriate treatment. He does have an old C Pap machine that belonged to a relative, but I would rather him get a new C Pap device if possible. Will refer him to one of the sleep organization's to see if we can get a donated C Pap device. If not, we will work with his old machine and have it set appropriately. I have stressed to the patient the importance of aggressive weight loss.

## 2015-01-06 NOTE — Patient Instructions (Signed)
Will see if we can get you a cpap machine through a sleep organization since your insurance will not cover cpap.  If this does not work out, we can have your relatives old machine set for you as long as it is working properly Work on [employer]weight loss followup with Dr. Craige CottaSood in 8 weeks.

## 2015-01-06 NOTE — Progress Notes (Signed)
   Subjective:    Patient ID: Caleb BunMyron T [name], male    DOB: [birth year], 34 y.o.   MRN: [id number]  HPI The patient comes in today for follow-up of his known severe obstructive sleep apnea. He was seen last year, and a C Pap device was ordered for him. However, his insurance would not cover the device, and therefore his sleep apnea is not being treated. He comes in today to discuss possible options for getting him the device.   Review of Systems  Constitutional: Positive for fatigue. Negative for fever and unexpected weight change.  HENT: Negative for congestion, dental problem, ear pain, nosebleeds, postnasal drip, rhinorrhea, sinus pressure, sneezing, sore throat and trouble swallowing.   Eyes: Negative for redness and itching.  Respiratory: Negative for cough, chest tightness, shortness of breath and wheezing.   Cardiovascular: Negative for palpitations and leg swelling.  Gastrointestinal: Negative for nausea and vomiting.  Genitourinary: Negative for dysuria.  Musculoskeletal: Negative for joint swelling.  Skin: Negative for rash.  Neurological: Negative for headaches.  Hematological: Does not bruise/bleed easily.  Psychiatric/Behavioral: Negative for dysphoric mood. The patient is not nervous/anxious.        Objective:   Physical Exam Morbidly obese male in no acute distress Nose without purulence or discharge noted Neck without lymphadenopathy or thyromegaly Lower extremities with mild edema, no cyanosis Alert and oriented, moves all 4 extremities.       Assessment & Plan:

## 2015-01-16 ENCOUNTER — Telehealth: Payer: Self-pay | Admitting: Pulmonary Disease

## 2015-01-16 DIAGNOSIS — G4733 Obstructive sleep apnea (adult) (pediatric): Secondary | ICD-10-CM

## 2015-01-16 NOTE — Telephone Encounter (Signed)
Spoke with [facility] needs cpap order with template for pt.  This has been ordered.  Nothing further needed.

## 2015-03-15 ENCOUNTER — Encounter: Payer: Self-pay | Admitting: Physician Assistant

## 2015-03-15 ENCOUNTER — Other Ambulatory Visit (INDEPENDENT_AMBULATORY_CARE_PROVIDER_SITE_OTHER): Payer: No Typology Code available for payment source

## 2015-03-15 ENCOUNTER — Ambulatory Visit (INDEPENDENT_AMBULATORY_CARE_PROVIDER_SITE_OTHER): Payer: No Typology Code available for payment source | Admitting: Family

## 2015-03-15 ENCOUNTER — Encounter: Payer: Self-pay | Admitting: Family

## 2015-03-15 DIAGNOSIS — I10 Essential (primary) hypertension: Secondary | ICD-10-CM

## 2015-03-15 LAB — TSH: TSH: 1.3 u[IU]/mL (ref 0.35–4.50)

## 2015-03-15 LAB — CORTISOL: CORTISOL PLASMA: 4.7 ug/dL

## 2015-03-15 MED ORDER — LORCASERIN HCL 10 MG PO TABS: 10.0000 mg | ORAL_TABLET | Freq: Two times a day (BID) | ORAL | Status: DC

## 2015-03-15 NOTE — Assessment & Plan Note (Signed)
Discussed various methods of weight loss including eating nutrient dense foods and decreased saturated fats. Does have a physically active job, however encouraged setting up a plan for physical activity given his busy schedule. Discussed weight loss medication options and how there is concern for use of phentermine given his previous cardiac history. Start Belviq. Follow up in 1 month or sooner if needed.

## 2015-03-15 NOTE — Progress Notes (Signed)
Pre visit review using our clinic review tool, if applicable. No additional management support is needed unless otherwise documented below in the visit note. 

## 2015-03-15 NOTE — Patient Instructions (Addendum)
Thank you for choosing [facility].  Summary/Instructions:  Your prescription(s) have been submitted to your pharmacy or been printed and provided for you. Please take as directed and contact our office if you believe you are having problem(s) with the medication(s) or have any questions.  Please stop by the lab on the basement level of the building for your blood work. Your results will be released to MyChart (or called to you) after review, usually within 72 hours after test completion. If any changes need to be made, you will be notified at that same time.  If your symptoms worsen or fail to improve, please contact our office for further instruction, or in case of emergency go directly to the emergency room at the closest medical facility.    Lorcaserin oral tablets What is this medicine? LORCASERIN (lor ca SER in) is used to promote and maintain weight loss in obese patients. This medicine should be used with a reduced calorie diet and, if appropriate, an exercise program. This medicine may be used for other purposes; ask your health care provider or pharmacist if you have questions. COMMON BRAND NAME(S): Belviq What should I tell my health care provider before I take this medicine? They need to know if you have any of these conditions: -anatomical deformation of the penis, Peyronie's disease, or history of priapism (painful and prolonged erection) -diabetes -heart disease -history of blood diseases, like sickle cell anemia or leukemia -history of irregular heartbeat -kidney disease -liver disease -suicidal thoughts, plans, or attempt; a previous suicide attempt by you or a family member -an unusual or allergic reaction to lorcaserin, other medicines, foods, dyes, or preservatives -pregnant or trying to get pregnant -breast-feeding How should I use this medicine? Take this medicine by mouth with a glass of water. Follow the directions on the prescription label. You can take  it with or without food. Take your medicine at regular intervals. Do not take it more often than directed. Do not stop taking except on your doctor's advice. Talk to your pediatrician regarding the use of this medicine in children. Special care may be needed. Overdosage: If you think you've taken too much of this medicine contact a poison control center or emergency room at once. Overdosage: If you think you have taken too much of this medicine contact a poison control center or emergency room at once. NOTE: This medicine is only for you. Do not share this medicine with others. What if I miss a dose? If you miss a dose, take it as soon as you can. If it is almost time for your next dose, take only that dose. Do not take double or extra doses. What may interact with this medicine? -cabergoline -certain medicines for depression, anxiety, or psychotic disturbances -certain medicines for erectile dysfunction -certain medicines for migraine headache like almotriptan, eletriptan, frovatriptan, naratriptan, rizatriptan, sumatriptan, zolmitriptan -dextromethorphan -linezolid -MAOIs like Carbex, Eldepryl, Marplan, Nardil, and Parnate -medicines for diabetes -orlistat -tramadol -St. John's Wort -stimulant medicines for attention disorders, weight loss, or to stay awake This list may not describe all possible interactions. Give your health care provider a list of all the medicines, herbs, non-prescription drugs, or dietary supplements you use. Also tell them if you smoke, drink alcohol, or use illegal drugs. Some items may interact with your medicine. What should I watch for while using this medicine? This medicine is intended to be used in addition to a healthy diet and appropriate exercise. The best results are achieved this way. Do not increase  or in any way change your dose without consulting your doctor or health care professional. Your doctor should tell you to stop taking this medicine if you do  not lose a certain amount of weight within the first 12 weeks of treatment. Visit your doctor or health care professional for regular checkups. Your doctor may order blood tests or other tests to see how you are doing. Do not drive, use machinery, or do anything that needs mental alertness until you know how this medicine affects you. This medicine may affect blood sugar levels. If you have diabetes, check with your doctor or health care professional before you change your diet or the dose of your diabetic medicine. Patients and their families should watch out for worsening depression or thoughts of suicide. Also watch out for sudden changes in feelings such as feeling anxious, agitated, panicky, irritable, hostile, aggressive, impulsive, severely restless, overly excited and hyperactive, or not being able to sleep. If this happens, especially at the beginning of treatment or after a change in dose, call your health care professional. Contact you doctor or health care professional right away if the erection lasts longer than 4 hours or if it becomes painful. This may be a sign of serious problem and must be treated right away to prevent permanent damage. What side effects may I notice from receiving this medicine? Side effects that you should report to your doctor or health care professional as soon as possible: -allergic reactions like skin rash, itching or hives, swelling of the face, lips, or tongue -abnormal production of milk -breast enlargement in both males and females -breathing problems -changes in emotions or moods -changes in vision -confusion -erection lasting more than 4 hours -fast or irregular heart beat -feeling faint or lightheaded, falls -fever or chills, sore throat -hallucination, loss of contact with reality -high or low blood pressure -menstrual changes -restlessness -seizures -slow or irregular heartbeat -stiff muscles -sweating -suicidal thoughts or other mood  changes -tremors -trouble walking -unusually weak or tired -vomiting Side effects that usually do not require medical attention (Report these to your doctor or health care professional if they continue or are bothersome.): -back pain -constipation -cough -dizziness -dry mouth -low blood sugar if you have diabetes (ask your doctor or healthcare professional for a list of these symptoms) -nausea -tiredness This list may not describe all possible side effects. Call your doctor for medical advice about side effects. You may report side effects to FDA at 1-800-FDA-1088. Where should I keep my medicine? Keep out of the reach of children. Store at room temperature between 15 and 30 degrees C (59 and 86 degrees F). Throw away any unused medicine after the expiration date. NOTE: This sheet is a summary. It may not cover all possible information. If you have questions about this medicine, talk to your doctor, pharmacist, or health care provider.  2015, Elsevier/Gold Standard. (2011-02-12 17:15:17)

## 2015-03-15 NOTE — Progress Notes (Signed)
   Subjective:    Patient ID: Caleb [name], male    DOB: [birth year], 34 y.o.   MRN: [id number]  Chief Complaint  Patient presents with  . Establish Care    Discuss weight loss    HPI:  Caleb [name] is a 34 y.o. male with a PMH of pericarditis, hypertension, and morbid obesity who presents today for an office visit to establish care.  1.) Morbid obesity - Associated symptom of morbid obesity has been going on since he was young. Also experiences back pain on a regular basis. 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 Nutrition consists of 1 meal per day and then several snacks consisting of baked chicken, pasta, and occasional fast and processed foods, fruits and vegetables. Cooks in a [employer] oven to help reduce calories. Outside of work there is no structured exercise.   No Known Allergies   Current Outpatient Prescriptions on File Prior to Visit  Medication Sig Dispense Refill  . hydrochlorothiazide (HYDRODIURIL) 25 MG tablet Take 1 tablet (25 mg total) by mouth daily. 30 tablet 11   No current facility-administered medications on file prior to visit.    Review of Systems  Constitutional: Positive for fatigue. Negative for diaphoresis and appetite change.  Respiratory: Negative for chest tightness and shortness of breath.   Cardiovascular: Negative for chest pain, palpitations and leg swelling.  Neurological: Negative for headaches.      Objective:    BP 132/86 mmHg  Pulse 80  Temp(Src) 97.6 F (36.4 C)  Wt 392 lb 12 oz (178.15 kg)  SpO2 98% Nursing note and vital signs reviewed.  Physical Exam  Constitutional: He is oriented to person, place, and time. He appears well-developed and well-nourished. No distress.  Cardiovascular: Normal rate, regular rhythm, normal heart sounds and intact distal pulses.   Pulmonary/Chest: Effort normal and breath sounds normal.  Neurological: He is alert and oriented to person, place, and time.  Skin:  Skin is warm and dry.  Psychiatric: He has a normal mood and affect. His behavior is normal. Judgment and thought content normal.       Assessment & Plan:   Problem List Items Addressed This Visit      Other   Morbid obesity - Primary    Discussed various methods of weight loss including eating nutrient dense foods and decreased saturated fats. Does have a physically active job, however encouraged setting up a plan for physical activity given his busy schedule. Discussed weight loss medication options and how there is concern for use of phentermine given his previous cardiac history. Start Belviq. Follow up in 1 month or sooner if needed.       Relevant Medications   Lorcaserin HCl (BELVIQ) 10 MG TABS   Other Relevant Orders   Cortisol   TSH

## 2015-03-16 ENCOUNTER — Telehealth: Payer: Self-pay | Admitting: *Deleted

## 2015-03-16 ENCOUNTER — Encounter: Payer: Self-pay | Admitting: Family

## 2015-03-16 ENCOUNTER — Telehealth: Payer: Self-pay | Admitting: Physician Assistant

## 2015-03-16 MED ORDER — LORCASERIN HCL 10 MG PO TABS: 10.0000 mg | ORAL_TABLET | Freq: Two times a day (BID) | ORAL | Status: DC

## 2015-03-16 NOTE — Telephone Encounter (Signed)
Receive call Caleb [name] stated pt was given rx and coupon for free trial for Belviq. He can only received #15  Free, and for the remaining he would haven to pay $75 for the remaining pills. Wanting to get rx for a 15 day supply so he can get those free, and if med works for him he can have other script on file. Gave verbally for Belviq 10 mg BID # 30...[name]

## 2015-03-16 NOTE — Telephone Encounter (Signed)
New message     Following up on email from pt or Dr. Jeanine Luz regarding medical release on pt Needing to verify information/medication on this patient Please call to discuss

## 2015-03-16 NOTE — Telephone Encounter (Signed)
I s/w pt to advise that I got his message about the Rx for weight loss from PCP if ok to take. I advised pt that Caleb [name]. PA is aware however he is in clinic all day and we will get back to him ASAP. Pt said ok and thank you.  I also tried tcb [name] @ [facility] [phone].

## 2015-03-22 NOTE — Telephone Encounter (Signed)
Per Tereso Newcomer, PA he has advised pt to not take weight loss medication referred to him by his PCP.

## 2015-03-31 ENCOUNTER — Emergency Department (INDEPENDENT_AMBULATORY_CARE_PROVIDER_SITE_OTHER)
Admission: EM | Admit: 2015-03-31 | Discharge: 2015-03-31 | Disposition: A | Discharge status: Home / Self Care | Payer: No Typology Code available for payment source | Source: Home / Self Care | Attending: Emergency Medicine | Admitting: Emergency Medicine

## 2015-03-31 DIAGNOSIS — J039 Acute tonsillitis, unspecified: Secondary | ICD-10-CM | POA: Diagnosis not present

## 2015-03-31 DIAGNOSIS — R6889 Other general symptoms and signs: Secondary | ICD-10-CM

## 2015-03-31 LAB — POCT RAPID STREP A: Streptococcus, Group A Screen (Direct): NEGATIVE

## 2015-03-31 MED ORDER — CLINDAMYCIN HCL 300 MG PO CAPS: 300.0000 mg | ORAL_CAPSULE | Freq: Three times a day (TID) | ORAL | Status: DC

## 2015-03-31 MED ORDER — MUPIROCIN 2 % EX OINT: 1.0000 "application " | TOPICAL_OINTMENT | Freq: Two times a day (BID) | CUTANEOUS | Status: DC

## 2015-03-31 NOTE — ED Provider Notes (Signed)
CSN: [id number]     Arrival date & time 03/31/15  1602 History   First MD Initiated Contact with Patient 03/31/15 1702     No chief complaint on file.  (Consider location/radiation/quality/duration/timing/severity/associated sxs/prior Treatment) HPI  He is a 34 year old man here for evaluation of sore throat. He states his symptoms started about 2 days ago with some nasal congestion and rhinorrhea and a brief episode of chest discomfort. Yesterday, he developed nosebleeds from the left nostril. He reports continued mild nasal congestion and rhinorrhea. He reported a severe sore throat this morning, making it difficult for him to swallow. He is able to tolerate liquids. He reports a mild cough this morning. Denies fevers at home, but does describe chills. He is noted to have a fever here. No nausea or vomiting.  Past Medical History  Diagnosis Date  . HTN (hypertension)   . GERD (gastroesophageal reflux disease)   . LV dysfunction     a. 10/2013:  EF 45-50% with followup echo 55-60%.  . Cardiac tamponade 10/2013  . Pericarditis     a. 10/2013: Acute pericarditis with cardiac tamponade s/p pericardiocentesis.  . Morbid obesity   . Enlarged pulmonary artery     a. Mild enlargement of pulmonary artery by CT 11/01/13.  Marland Kitchen Hx of echocardiogram     Echo (12/14/13):  Mod LVH, EF 50-55%, no RWMA, trivial Eff.  . Sleep apnea    Past Surgical History  Procedure Laterality Date  . Fluid drained from heart    . Pericardial tap N/A 11/02/2013    Procedure: PERICARDIAL TAP;  Surgeon: Lennette Bihari, MD;  Location: [facility] CATH LAB;  Service: Cardiovascular;  Laterality: N/A;   Family History  Problem Relation Age of Onset  . CAD      Aunt with CABG  . Heart failure Father   . Heart attack Father   . Kidney cancer Father   . Pancreatic cancer Paternal Grandmother   . Stroke Neg Hx   . Hypertension Mother   . Healthy Maternal Grandmother   . Healthy Maternal Grandfather   . Healthy Paternal Grandfather     Social History  Substance Use Topics  . Smoking status: Current Some Day Smoker -- 0.00 packs/day for 3 years    Types: Cigars, Cigarettes  . Smokeless tobacco: Never Used     Comment: 1 cigar daily  . Alcohol Use: Yes     Comment: occasional    Review of Systems As in history of present illness Allergies  Review of patient's allergies indicates no known allergies.  Home Medications   Prior to Admission medications   Medication Sig Start Date End Date Taking? Authorizing Provider  clindamycin (CLEOCIN) 300 MG capsule Take 1 capsule (300 mg total) by mouth 3 (three) times daily. 03/31/15   Charm Rings, MD  hydrochlorothiazide (HYDRODIURIL) 25 MG tablet Take 1 tablet (25 mg total) by mouth daily. 02/16/14   Beatrice Lecher, PA-C  Lorcaserin HCl (BELVIQ) 10 MG TABS Take 10 mg by mouth 2 (two) times daily. 03/16/15   Veryl Speak, FNP  mupirocin ointment (BACTROBAN) 2 % Place 1 application into the nose 2 (two) times daily. For 2 weeks 03/31/15   Charm Rings, MD   BP 147/92 mmHg  Pulse 95  Temp(Src) 101.4 F (38.6 C) (Oral)  Resp 20  SpO2 96% Physical Exam  Constitutional: He is oriented to person, place, and time. He appears well-developed and well-nourished. No distress.  Morbidly obese  HENT:  Mouth/Throat: Oropharyngeal exudate present.  He has a conglomeration of cystlike structures at the superior aspect of his left nare. This appears to be where the bleeding has been coming from.  Neck: Neck supple.  Cardiovascular: Normal rate, regular rhythm and normal heart sounds.   No murmur heard. Pulmonary/Chest: Effort normal and breath sounds normal. No respiratory distress. He has no wheezes. He has no rales.  Lymphadenopathy:    He has no cervical adenopathy.  Neurological: He is alert and oriented to person, place, and time.    ED Course  Procedures (including critical care time) Labs Review Labs Reviewed  POCT RAPID STREP A    Imaging Review No results  found.   MDM   1. Exudative tonsillitis   2. Nasal airway abnormality    We'll treat with clindamycin. Throat culture sent. Mupirocin ointment for 2 weeks in the nose. Follow-up as needed.    Charm Rings, MD 03/31/15 [phone]

## 2015-03-31 NOTE — Discharge Instructions (Signed)
You have an infection of your tonsils. Take clindamycin 1 pill 3 times a day for 10 days. The fever should resolve in the next 24-48 hours. Use the nasal ointment twice a day for 2 weeks. Follow-up as needed.

## 2015-04-02 ENCOUNTER — Emergency Department (HOSPITAL_COMMUNITY): Payer: No Typology Code available for payment source

## 2015-04-02 ENCOUNTER — Emergency Department (HOSPITAL_COMMUNITY)
Admission: EM | Admit: 2015-04-02 | Discharge: 2015-04-02 | Disposition: A | Discharge status: Home / Self Care | Payer: No Typology Code available for payment source | Attending: Emergency Medicine | Admitting: Emergency Medicine

## 2015-04-02 ENCOUNTER — Encounter (HOSPITAL_COMMUNITY): Payer: Self-pay | Admitting: *Deleted

## 2015-04-02 DIAGNOSIS — R079 Chest pain, unspecified: Secondary | ICD-10-CM | POA: Insufficient documentation

## 2015-04-02 DIAGNOSIS — Z8669 Personal history of other diseases of the nervous system and sense organs: Secondary | ICD-10-CM | POA: Diagnosis not present

## 2015-04-02 DIAGNOSIS — I10 Essential (primary) hypertension: Secondary | ICD-10-CM | POA: Diagnosis not present

## 2015-04-02 DIAGNOSIS — Z792 Long term (current) use of antibiotics: Secondary | ICD-10-CM | POA: Insufficient documentation

## 2015-04-02 DIAGNOSIS — J40 Bronchitis, not specified as acute or chronic: Secondary | ICD-10-CM

## 2015-04-02 DIAGNOSIS — Z720 Tobacco use: Secondary | ICD-10-CM | POA: Insufficient documentation

## 2015-04-02 DIAGNOSIS — Z8719 Personal history of other diseases of the digestive system: Secondary | ICD-10-CM | POA: Insufficient documentation

## 2015-04-02 DIAGNOSIS — Z79899 Other long term (current) drug therapy: Secondary | ICD-10-CM | POA: Insufficient documentation

## 2015-04-02 DIAGNOSIS — R05 Cough: Secondary | ICD-10-CM | POA: Diagnosis present

## 2015-04-02 LAB — CBC WITH DIFFERENTIAL/PLATELET
Basophils Absolute: 0 10*3/uL (ref 0.0–0.1)
Basophils Relative: 1 % (ref 0–1)
Eosinophils Absolute: 0.7 10*3/uL (ref 0.0–0.7)
Eosinophils Relative: 11 % — ABNORMAL HIGH (ref 0–5)
HCT: 37.6 % — ABNORMAL LOW (ref 39.0–52.0)
Hemoglobin: 12.1 g/dL — ABNORMAL LOW (ref 13.0–17.0)
Lymphocytes Relative: 35 % (ref 12–46)
Lymphs Abs: 2.3 10*3/uL (ref 0.7–4.0)
MCH: 23 pg — ABNORMAL LOW (ref 26.0–34.0)
MCHC: 32.2 g/dL (ref 30.0–36.0)
MCV: 71.3 fL — ABNORMAL LOW (ref 78.0–100.0)
Monocytes Absolute: 0.7 10*3/uL (ref 0.1–1.0)
Monocytes Relative: 10 % (ref 3–12)
Neutro Abs: 2.8 10*3/uL (ref 1.7–7.7)
Neutrophils Relative %: 43 % (ref 43–77)
Platelets: 236 10*3/uL (ref 150–400)
RBC: 5.27 MIL/uL (ref 4.22–5.81)
RDW: 15.1 % (ref 11.5–15.5)
WBC: 6.4 10*3/uL (ref 4.0–10.5)

## 2015-04-02 LAB — BASIC METABOLIC PANEL
Anion gap: 9 (ref 5–15)
BUN: 9 mg/dL (ref 6–20)
CO2: 25 mmol/L (ref 22–32)
Calcium: 8.4 mg/dL — ABNORMAL LOW (ref 8.9–10.3)
Chloride: 102 mmol/L (ref 101–111)
Creatinine, Ser: 0.97 mg/dL (ref 0.61–1.24)
GFR calc Af Amer: >60 mL/min (ref >60)
GFR calc non Af Amer: >60 mL/min (ref >60)
Glucose, Bld: 122 mg/dL — ABNORMAL HIGH (ref 65–99)
Potassium: 3.6 mmol/L (ref 3.5–5.1)
Sodium: 136 mmol/L (ref 135–145)

## 2015-04-02 MED ORDER — IBUPROFEN 800 MG PO TABS
800.0000 mg | ORAL_TABLET | Freq: Once | ORAL | Status: AC
  Administered 2015-04-02: 800 mg via ORAL
  Filled 2015-04-02: qty 1

## 2015-04-02 MED ORDER — IPRATROPIUM-ALBUTEROL 0.5-2.5 (3) MG/3ML IN SOLN
3.0000 mL | Freq: Once | RESPIRATORY_TRACT | Status: AC
  Administered 2015-04-02: 3 mL via RESPIRATORY_TRACT
  Filled 2015-04-02: qty 3

## 2015-04-02 MED ORDER — ALBUTEROL SULFATE HFA 108 (90 BASE) MCG/ACT IN AERS: 1.0000 | INHALATION_SPRAY | Freq: Four times a day (QID) | RESPIRATORY_TRACT | Status: DC | PRN

## 2015-04-02 NOTE — ED Notes (Signed)
Patient reports flu like symptoms starting Thursday, went to urgent care and flu was negative started on clindamycin due to patients history of pericarditis. Patient states that tthis am he woke up with central chest pain. Patient states he has been coughing since Thursday and taking nyquil to help relieve symptoms at night. No fever today but did have one on Friday and Saturday.

## 2015-04-02 NOTE — Discharge Instructions (Signed)
Please use medication as directed, please monitor for new or worsening signs or symptoms return immediately if any present. Please contact her primary care provider inform them of your visit today. Please schedule follow-up evaluation in 3 days for reevaluation.

## 2015-04-02 NOTE — ED Provider Notes (Signed)
CSN: [id number]     Arrival date & time 04/02/15  4098 History   First MD Initiated Contact with Patient 04/02/15 1011     Chief Complaint  Patient presents with  . Cough  . Chest Pain   HPI   34 year old Caleb [name] presents today with cough and chest pain. Patient reports that he was seen 3 days ago by his primary care provider for sore throat, nasal congestion, and an episode of chest pain. He had a rapid strep screen that was negative with culture pending. Patient reports that over the last several days he's had episodic chills, denies fever. Patient reports he was prescribed clindamycin 300 mg 3 times a day, reports he has been taking it for the last 2 days. He notes that upon awakening this morning the cough was worse with substernal chest pain during he reports the chest pain is "sore" with no radiation, made worse by coughing. Patient states that the pain is not made worse by positioning. Patient has not tried any over-the-counter therapies at home for this. 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 Patient reports this does not feel similar to that pain. Patient reports that he quit smoking approximately a month ago, has no significant cardiac history other than that noted, has never had PE/DVT, no recent surgery, no estrogen use, no recent trauma or prolonged immobilization. He has no lower extremity swelling/ edema/pain.  Past Medical History  Diagnosis Date  . HTN (hypertension)   . GERD (gastroesophageal reflux disease)   . LV dysfunction     a. 10/2013:  EF 45-50% with followup echo 55-60%.  . Cardiac tamponade 10/2013  . Pericarditis     a. 10/2013: Acute pericarditis with cardiac tamponade s/p pericardiocentesis.  . Morbid obesity   . Enlarged pulmonary artery     a. Mild enlargement of pulmonary artery by CT 11/01/13.  Marland Kitchen Hx of echocardiogram     Echo (12/14/13):  Mod LVH, EF 50-55%, no RWMA, trivial Eff.  . Sleep apnea    Past Surgical  History  Procedure Laterality Date  . Fluid drained from heart    . Pericardial tap N/A 11/02/2013    Procedure: PERICARDIAL TAP;  Surgeon: Lennette Bihari, MD;  Location: [facility] CATH LAB;  Service: Cardiovascular;  Laterality: N/A;   Family History  Problem Relation Age of Onset  . CAD      Aunt with CABG  . Heart failure Father   . Heart attack Father   . Kidney cancer Father   . Pancreatic cancer Paternal Grandmother   . Stroke Neg Hx   . Hypertension Mother   . Healthy Maternal Grandmother   . Healthy Maternal Grandfather   . Healthy Paternal Grandfather    Social History  Substance Use Topics  . Smoking status: Current Some Day Smoker -- 0.00 packs/day for 3 years    Types: Cigars, Cigarettes  . Smokeless tobacco: Never Used     Comment: 1 cigar daily  . Alcohol Use: Yes     Comment: occasional    Review of Systems  All other systems reviewed and are negative.   Allergies  Review of patient's allergies indicates no known allergies.  Home Medications   Prior to Admission medications   Medication Sig Start Date End Date Taking? Authorizing Provider  clindamycin (CLEOCIN) 300 MG capsule Take 1 capsule (300 mg total) by mouth 3 (three) times daily. 03/31/15  Yes Charm Rings, MD  DM-Doxylamine-Acetaminophen Doreatha Martin  COLD & FLU PO) Take 2 each by mouth 3 (three) times daily as needed (fever/sore throat).   Yes Historical Provider, MD  hydrochlorothiazide (HYDRODIURIL) 25 MG tablet Take 1 tablet (25 mg total) by mouth daily. 02/16/14  Yes Scott Moishe Spice, PA-C  Lorcaserin HCl (BELVIQ) 10 MG TABS Take 10 mg by mouth 2 (two) times daily. 03/16/15  Yes Veryl Speak, FNP  mupirocin ointment (BACTROBAN) 2 % Place 1 application into the nose 2 (two) times daily. For 2 weeks 03/31/15  Yes Charm Rings, MD  albuterol (PROVENTIL HFA;VENTOLIN HFA) 108 (90 BASE) MCG/ACT inhaler Inhale 1-2 puffs into the lungs every 6 (six) hours as needed for wheezing or shortness of breath. 04/02/15   Eyvonne Mechanic, PA-C   BP 138/Caleb mmHg  Pulse 83  Temp(Src) 97.7 F (36.5 C) (Oral)  Resp 18  SpO2 99%   Physical Exam  Constitutional: He is oriented to person, place, and time. He appears well-developed and well-nourished.  HENT:  Head: Normocephalic and atraumatic.  Eyes: Conjunctivae are normal. Pupils are equal, round, and reactive to light. Right eye exhibits no discharge. Left eye exhibits no discharge. No scleral icterus.  Neck: Normal range of motion. No JVD present. No tracheal deviation present.  Pulmonary/Chest: Effort normal. No stridor. No respiratory distress. He has wheezes. He has no rales. He exhibits tenderness.  Abdominal: Soft. He exhibits no distension and no mass. There is no tenderness. There is no rebound and no guarding.  Musculoskeletal: Normal range of motion. He exhibits no edema or tenderness.  Neurological: He is alert and oriented to person, place, and time. Coordination normal.  Skin: Skin is warm and dry.  Psychiatric: He has a normal mood and affect. His behavior is normal. Judgment and thought content normal.  Nursing note and vitals reviewed.     ED Course  Procedures (including critical care time) Labs Review Labs Reviewed  CBC WITH DIFFERENTIAL/PLATELET - Abnormal; Notable for the following:    Hemoglobin 12.1 (*)    HCT 37.6 (*)    MCV 71.3 (*)    MCH 23.0 (*)    Eosinophils Relative 11 (*)    All other components within normal limits  BASIC METABOLIC PANEL - Abnormal; Notable for the following:    Glucose, Bld 122 (*)    Calcium 8.4 (*)    All other components within normal limits    Imaging Review Dg Chest 2 View  04/02/2015   CLINICAL DATA:  One day history of productive cough. Acute onset of wheezing earlier today. Chest pain with coughing. Personal history of acute pericarditis in March, 2015 requiring pericardiocentesis.  EXAM: CHEST  2 VIEW  COMPARISON:  None.  FINDINGS: Suboptimal inspiration due to body habitus accounts for crowded  bronchovascular markings, especially in the bases, and accentuates the cardiac silhouette. Taking this into account, cardiac silhouette normal in size, unchanged from the prior examinations with the exception of the 11/01/2013 chest x-ray at the time of the pericardial effusion. Mild to moderate central bronchial wall thickening, more so than on prior examinations. Lungs otherwise clear. No localized airspace consolidation. No pleural effusions. No pneumothorax. Normal pulmonary vascularity. Visualized bony thorax intact.  IMPRESSION: Mild to moderate changes of acute bronchitis and/or asthma without focal airspace pneumonia.   Electronically Signed   By: Hulan Saas M.D.   On: 04/02/2015 11:44   I have personally reviewed and evaluated these images and lab results as part of my medical decision-making.   EKG Interpretation  Date/Time:  Sunday April 02 2015 10:13:25 EDT Ventricular Rate:  84 PR Interval:  181 QRS Duration: 97 QT Interval:  343 QTC Calculation: 405 R Axis:   9 Text Interpretation:  Sinus rhythm No significant change since last  tracing Confirmed by YAO  MD, DAVID ([id number]) on 04/02/2015 10:16:37 AM      MDM   Final diagnoses:  Bronchitis     Labs: CBC, BMP-no significant or contributory findings  Imaging: Mild to moderate changes of acute bronchitis and/or asthma without focal airspace pneumonia  Consults:  Therapeutics: DuoNeb 2  Discharge Meds: Albuterol inhaler  Assessment/Plan: 34 year old Caleb [name] presents with bronchitis. Patient was recently seen and treated for suspected strep throat with clindamycin, currently taking. Patient presents today afebrile, reassuring vital signs. He had wheeze on exam, was given 2 courses of DuoNeb which significantly increased his wheezing. Patient had mild wheezing in the bilateral lower lobes, he was in no respiratory distress. No signs of pneumonia on chest x-ray. Patient has chest pain only with coughing, no shortness of  breath, he is perk negative. Patient was asymptomatic at the time my evaluation. Highly unlikely to be ACS or PE Patient will be given albuterol inhaler, instructions follow-up with his primary care provider in 3 days for reevaluation. Patient given strict return precautions, verbalized understanding and agreement for today's plan.         Eyvonne Mechanic, PA-C 04/02/15 1421  Richardean Canal, MD 04/02/15 [phone]

## 2015-04-02 NOTE — ED Notes (Signed)
Pt placed on monitor. Pt remains monitored by blood pressure, pulse ox, and 5 lead.  

## 2015-04-03 ENCOUNTER — Encounter: Payer: Self-pay | Admitting: Family

## 2015-04-03 LAB — CULTURE, GROUP A STREP

## 2015-04-03 NOTE — ED Notes (Signed)
Final report of strep positive, treatment adequate w Rx provided per Dr Randal Buba, MD

## 2015-04-05 ENCOUNTER — Emergency Department (INDEPENDENT_AMBULATORY_CARE_PROVIDER_SITE_OTHER)
Admission: EM | Admit: 2015-04-05 | Discharge: 2015-04-05 | Disposition: A | Discharge status: Home / Self Care | Payer: No Typology Code available for payment source | Source: Home / Self Care | Attending: Family Medicine | Admitting: Family Medicine

## 2015-04-05 ENCOUNTER — Encounter (HOSPITAL_COMMUNITY): Payer: Self-pay | Admitting: Emergency Medicine

## 2015-04-05 DIAGNOSIS — H6092 Unspecified otitis externa, left ear: Secondary | ICD-10-CM

## 2015-04-05 DIAGNOSIS — J45901 Unspecified asthma with (acute) exacerbation: Secondary | ICD-10-CM | POA: Diagnosis not present

## 2015-04-05 DIAGNOSIS — H6982 Other specified disorders of Eustachian tube, left ear: Secondary | ICD-10-CM | POA: Diagnosis not present

## 2015-04-05 MED ORDER — NEOMYCIN-POLYMYXIN-HC 3.5-10000-1 OT SOLN: OTIC | Status: DC

## 2015-04-05 MED ORDER — DEXAMETHASONE 4 MG PO TABS
ORAL_TABLET | ORAL | Status: AC
  Filled 2015-04-05: qty 2

## 2015-04-05 MED ORDER — PREDNISONE 50 MG PO TABS: ORAL_TABLET | ORAL | Status: DC

## 2015-04-05 MED ORDER — DEXAMETHASONE 2 MG PO TABS
ORAL_TABLET | ORAL | Status: AC
  Filled 2015-04-05: qty 1

## 2015-04-05 MED ORDER — DEXAMETHASONE 4 MG PO TABS
10.0000 mg | ORAL_TABLET | Freq: Once | ORAL | Status: AC
  Administered 2015-04-05: 10 mg via ORAL

## 2015-04-05 NOTE — ED Provider Notes (Signed)
CSN: [id number]     Arrival date & time 04/05/15  1334 History   First MD Initiated Contact with Patient 04/05/15 1356     Chief Complaint  Patient presents with  . Otalgia   (Consider location/radiation/quality/duration/timing/severity/associated sxs/prior Treatment) HPI  Left ear pain. No discharge. Constant. Getting worse. Started 3 days ago. Intermittent hearing loss and ringing in his ear. States that he was recently diagnosed with strep throat and has been on clindamycin since then. Patient also went to the ED the other day for an asthma flare and was given an albuterol inhaler. Intermittent relief with the inhaler. Continues to wheeze. Denies any fevers, nausea, vomiting, neck stiffness, headache, chest pain, shortness of breath, palpitations. Denies any vertigo or dizziness.  Past Medical History  Diagnosis Date  . HTN (hypertension)   . GERD (gastroesophageal reflux disease)   . LV dysfunction     a. 10/2013:  EF 45-50% with followup echo 55-60%.  . Cardiac tamponade 10/2013  . Pericarditis     a. 10/2013: Acute pericarditis with cardiac tamponade s/p pericardiocentesis.  . Morbid obesity   . Enlarged pulmonary artery     a. Mild enlargement of pulmonary artery by CT 11/01/13.  Marland Kitchen Hx of echocardiogram     Echo (12/14/13):  Mod LVH, EF 50-55%, no RWMA, trivial Eff.  . Sleep apnea    Past Surgical History  Procedure Laterality Date  . Fluid drained from heart    . Pericardial tap N/A 11/02/2013    Procedure: PERICARDIAL TAP;  Surgeon: Lennette Bihari, MD;  Location: [facility] CATH LAB;  Service: Cardiovascular;  Laterality: N/A;   Family History  Problem Relation Age of Onset  . CAD      Aunt with CABG  . Heart failure Father   . Heart attack Father   . Kidney cancer Father   . Pancreatic cancer Paternal Grandmother   . Stroke Neg Hx   . Hypertension Mother   . Healthy Maternal Grandmother   . Healthy Maternal Grandfather   . Healthy Paternal Grandfather    Social History   Substance Use Topics  . Smoking status: Current Some Day Smoker -- 0.00 packs/day for 3 years    Types: Cigars, Cigarettes  . Smokeless tobacco: Never Used     Comment: 1 cigar daily  . Alcohol Use: Yes     Comment: occasional    Review of Systems Per HPI with all other pertinent systems negative.   Allergies  Review of patient's allergies indicates no known allergies.  Home Medications   Prior to Admission medications   Medication Sig Start Date End Date Taking? Authorizing Provider  albuterol (PROVENTIL HFA;VENTOLIN HFA) 108 (90 BASE) MCG/ACT inhaler Inhale 1-2 puffs into the lungs every 6 (six) hours as needed for wheezing or shortness of breath. 04/02/15   Eyvonne Mechanic, PA-C  clindamycin (CLEOCIN) 300 MG capsule Take 1 capsule (300 mg total) by mouth 3 (three) times daily. 03/31/15   Charm Rings, MD  DM-Doxylamine-Acetaminophen (NYQUIL COLD & FLU PO) Take 2 each by mouth 3 (three) times daily as needed (fever/sore throat).    Historical Provider, MD  hydrochlorothiazide (HYDRODIURIL) 25 MG tablet Take 1 tablet (25 mg total) by mouth daily. 02/16/14   Beatrice Lecher, PA-C  Lorcaserin HCl (BELVIQ) 10 MG TABS Take 10 mg by mouth 2 (two) times daily. 03/16/15   Veryl Speak, FNP  mupirocin ointment (BACTROBAN) 2 % Place 1 application into the nose 2 (two) times daily. For  2 weeks 03/31/15   Charm Rings, MD  neomycin-polymyxin-hydrocortisone (CORTISPORIN) otic solution 3-4 Drops, 4 times per day for 7 days 04/05/15   Ozella Rocks, MD  predniSONE (DELTASONE) 50 MG tablet Take daily with breakfast 04/05/15   Ozella Rocks, MD   BP 141/87 mmHg  Pulse 79  Temp(Src) 98.5 F (36.9 C) (Oral)  Resp 20  SpO2 96% Physical Exam Physical Exam  Constitutional: oriented to person, place, and time. appears well-developed and well-nourished. No distress.  HENT:  Left TM with mild clear effusion, left external ear canal with erythematous skin and mild skin maceration in the 2 to 5:00  positions. Head: Normocephalic and atraumatic.  Eyes: EOMI. PERRL.  Neck: Normal range of motion.  Cardiovascular: RRR, no m/r/g, 2+ distal pulses,  Pulmonary/Chest: Diffuse wheezing with excellent air movement. No rhonchi..  Abdominal: Soft. Bowel sounds are normal. NonTTP, no distension.  Musculoskeletal: Normal range of motion. Non ttp, no effusion.  Neurological: alert and oriented to person, place, and time.  Skin: Skin is warm. No rash noted. non diaphoretic.  Psychiatric: normal mood and affect. behavior is normal. Judgment and thought content normal.   ED Course  Procedures (including critical care time) Labs Review Labs Reviewed - No data to display  Imaging Review No results found.   MDM   1. Otitis externa, left   2. Asthma exacerbation   3. Eustachian tube dysfunction, left    Decadron 10 mg by mouth given in clinic. Start prednisone daily with breakfast. Albuterol every 4 hours for the next 24 hours and when necessary. Start nightly allergy pill such as Zyrtec. Continue antibiotics for strep. Start Cortisporin for otitis externa.    Ozella Rocks, MD 04/05/15 [phone]

## 2015-04-05 NOTE — Discharge Instructions (Signed)
You are suffering from several medical conditions including an asthma flare, eustachian tube dysfunction, and an external ear canal infection. Please use the anabolic drops as prescribed. Your given a dose of steroids to help with the eustachian tube dysfunction, ear pain, and continued asthma flare. Please use your albuterol inhaler every 4 hours for the next 24 hours. Please continue with daily steroids with breakfast for the next 5 days. Please use an nightly allergy pill such as Zyrtec or Allegra.

## 2015-04-05 NOTE — ED Notes (Signed)
C/o ear pain States he has ringing in left ear States he has pain in ear that comes and goes

## 2015-04-07 ENCOUNTER — Encounter: Payer: Self-pay | Admitting: Family

## 2015-04-09 ENCOUNTER — Emergency Department (HOSPITAL_COMMUNITY)
Admission: EM | Admit: 2015-04-09 | Discharge: 2015-04-09 | Disposition: A | Discharge status: Home / Self Care | Payer: No Typology Code available for payment source | Attending: Emergency Medicine | Admitting: Emergency Medicine

## 2015-04-09 ENCOUNTER — Encounter (HOSPITAL_COMMUNITY): Payer: Self-pay | Admitting: *Deleted

## 2015-04-09 DIAGNOSIS — Z79899 Other long term (current) drug therapy: Secondary | ICD-10-CM | POA: Insufficient documentation

## 2015-04-09 DIAGNOSIS — H9312 Tinnitus, left ear: Secondary | ICD-10-CM | POA: Diagnosis not present

## 2015-04-09 DIAGNOSIS — H6092 Unspecified otitis externa, left ear: Secondary | ICD-10-CM

## 2015-04-09 DIAGNOSIS — Z720 Tobacco use: Secondary | ICD-10-CM | POA: Insufficient documentation

## 2015-04-09 DIAGNOSIS — Z792 Long term (current) use of antibiotics: Secondary | ICD-10-CM | POA: Diagnosis not present

## 2015-04-09 DIAGNOSIS — K219 Gastro-esophageal reflux disease without esophagitis: Secondary | ICD-10-CM | POA: Diagnosis not present

## 2015-04-09 DIAGNOSIS — I10 Essential (primary) hypertension: Secondary | ICD-10-CM | POA: Insufficient documentation

## 2015-04-09 MED ORDER — CIPROFLOXACIN-HYDROCORTISONE 0.2-1 % OT SUSP: 3.0000 [drp] | Freq: Two times a day (BID) | OTIC | Status: DC

## 2015-04-09 MED ORDER — PSEUDOEPHEDRINE HCL 60 MG PO TABS: 60.0000 mg | ORAL_TABLET | Freq: Four times a day (QID) | ORAL | Status: DC | PRN

## 2015-04-09 NOTE — ED Notes (Signed)
Declined W/C at D/C and was escorted to lobby by RN. 

## 2015-04-09 NOTE — ED Notes (Signed)
PT returns today for follow up for ringing in ears. Pt has one more dose of steroids to take . Pt reports bronchitis Sx's are improving with an occ. Cough. Pt main concern is the ringing in ears.

## 2015-04-09 NOTE — ED Provider Notes (Signed)
CSN: [id number]   Arrival date & time 04/09/15 1334  History  This chart was scribed for non-physician practitioner, Trixie Dredge PA-C, working with Marily Memos, MD by Bethel Born, ED Scribe. This patient was seen in room TR04C/TR04C and the patient's care was started at 3:29 PM.  Chief Complaint  Patient presents with  . Tinnitus    HPI The history is provided by the patient. No language interpreter was used.   Caleb [name] is a 34 y.o. male who presents to the Emergency Department complaining of left ear foreign body sensation and intermittent left-sided tinnitus with onset 7 days ago. Pt describes the sensation as "like a ringing bell". He was recently treated for strep throat and bronchitis and at that time he complained of the tinnitus but notes that the ear drops he was prescribed 6 days ago have provided no relief. Associated symptoms include a discomfort in the left ear ("like there is something in it"). Pt denies discharge from the ear, dental pain, and fever. His bronchitis symptoms improved after steroids but he still has a slight cough.  Overall he states he is feeling much better.  Past Medical History  Diagnosis Date  . HTN (hypertension)   . GERD (gastroesophageal reflux disease)   . LV dysfunction     a. 10/2013:  EF 45-50% with followup echo 55-60%.  . Cardiac tamponade 10/2013  . Pericarditis     a. 10/2013: Acute pericarditis with cardiac tamponade s/p pericardiocentesis.  . Morbid obesity   . Enlarged pulmonary artery     a. Mild enlargement of pulmonary artery by CT 11/01/13.  Marland Kitchen Hx of echocardiogram     Echo (12/14/13):  Mod LVH, EF 50-55%, no RWMA, trivial Eff.  . Sleep apnea     Past Surgical History  Procedure Laterality Date  . Fluid drained from heart    . Pericardial tap N/A 11/02/2013    Procedure: PERICARDIAL TAP;  Surgeon: Lennette Bihari, MD;  Location: [facility] CATH LAB;  Service: Cardiovascular;  Laterality: N/A;    Family History  Problem Relation Age  of Onset  . CAD      Aunt with CABG  . Heart failure Father   . Heart attack Father   . Kidney cancer Father   . Pancreatic cancer Paternal Grandmother   . Stroke Neg Hx   . Hypertension Mother   . Healthy Maternal Grandmother   . Healthy Maternal Grandfather   . Healthy Paternal Grandfather     Social History  Substance Use Topics  . Smoking status: Current Some Day Smoker -- 0.00 packs/day for 3 years    Types: Cigars, Cigarettes  . Smokeless tobacco: Never Used     Comment: 1 cigar daily  . Alcohol Use: Yes     Comment: occasional     Review of Systems  Constitutional: Negative for fever and chills.  HENT: Positive for tinnitus (left-sided). Negative for dental problem, ear discharge, sore throat and trouble swallowing.        Discomfort in the left ear  Respiratory: Positive for cough. Negative for shortness of breath.   Cardiovascular: Negative for chest pain.  Skin: Negative for rash.  Allergic/Immunologic: Negative for immunocompromised state.  Hematological: Does not bruise/bleed easily.  Psychiatric/Behavioral: Negative for self-injury.    Home Medications   Prior to Admission medications   Medication Sig Start Date End Date Taking? Authorizing Provider  albuterol (PROVENTIL HFA;VENTOLIN HFA) 108 (90 BASE) MCG/ACT inhaler Inhale 1-2 puffs into the lungs  every 6 (six) hours as needed for wheezing or shortness of breath. 04/02/15   Eyvonne Mechanic, PA-C  clindamycin (CLEOCIN) 300 MG capsule Take 1 capsule (300 mg total) by mouth 3 (three) times daily. 03/31/15   Charm Rings, MD  DM-Doxylamine-Acetaminophen (NYQUIL COLD & FLU PO) Take 2 each by mouth 3 (three) times daily as needed (fever/sore throat).    Historical Provider, MD  hydrochlorothiazide (HYDRODIURIL) 25 MG tablet Take 1 tablet (25 mg total) by mouth daily. 02/16/14   Beatrice Lecher, PA-C  Lorcaserin HCl (BELVIQ) 10 MG TABS Take 10 mg by mouth 2 (two) times daily. 03/16/15   Veryl Speak, FNP  mupirocin  ointment (BACTROBAN) 2 % Place 1 application into the nose 2 (two) times daily. For 2 weeks 03/31/15   Charm Rings, MD  neomycin-polymyxin-hydrocortisone (CORTISPORIN) otic solution 3-4 Drops, 4 times per day for 7 days 04/05/15   Ozella Rocks, MD  predniSONE (DELTASONE) 50 MG tablet Take daily with breakfast 04/05/15   Ozella Rocks, MD    Allergies  Review of patient's allergies indicates no known allergies.  Triage Vitals: BP 133/80 mmHg  Pulse 79  Temp(Src) 97.9 F (36.6 C)  Resp 16  SpO2 97%  Physical Exam  Constitutional: He appears well-developed and well-nourished. No distress.  HENT:  Head: Normocephalic and atraumatic.  Mouth/Throat: Oropharynx is clear and moist. No oropharyngeal exudate.  No pain with pressure on the tragus or traction on the pinna bilaterally  TMs normal bilaterally Normal light reflex Left canal erythematous and edematous No mastoid tenderness   Eyes: Conjunctivae and EOM are normal. Right eye exhibits no discharge. Left eye exhibits no discharge.  Neck: Normal range of motion. Neck supple.  Cardiovascular: Normal rate and regular rhythm.   Pulmonary/Chest: Effort normal and breath sounds normal. No stridor. No respiratory distress. He has no wheezes. He has no rales.  Lungs CTAB  Lymphadenopathy:    He has no cervical adenopathy.  Neurological: He is alert.  Skin: He is not diaphoretic.  Psychiatric: He has a normal mood and affect. His behavior is normal.  Nursing note and vitals reviewed.   ED Course  Procedures  DIAGNOSTIC STUDIES: Oxygen Saturation is 97% on RA,  normal by my interpretation.    COORDINATION OF CARE: 3:35 PM Discussed treatment plan which includes discharge with Cipro and Sudafed with pt at bedside and pt agreed to plan.  Labs Review- Labs Reviewed - No data to display  Imaging Review No results found.  EKG Interpretation None      MDM   Final diagnoses:  Ear ringing, left  Otitis externa, left    Afebrile, nontoxic patient with improved strep pharyngitis, improving bronchitis, persistent left ear discomfort and occasional ear ringing.  Ear canal with erythema and edema without discharge.  TM is normal.  Possible resolving otitis externa vs eustacian tube dysfunction from resolving pharyngeal edema.  D/C home with cipro-HC and sudafed.  PCP follow up.  Discussed result, findings, treatment, and follow up  with patient.  Pt given return precautions.  Pt verbalizes understanding and agrees with plan.       I personally performed the services described in this documentation, which was scribed in my presence. The recorded information has been reviewed and is accurate.      Trixie Dredge, PA-C 04/09/15 1641  Marily Memos, MD 04/10/15 1030

## 2015-04-09 NOTE — Discharge Instructions (Signed)
Read the information below.  Use the prescribed medication as directed.  Please discuss all new medications with your pharmacist.  You may return to the Emergency Department at any time for worsening condition or any new symptoms that concern you.     If you develop fevers, uncontrolled ear pain, bleeding or discharge from your ear, see your doctor or return for a recheck.    °

## 2015-07-26 ENCOUNTER — Telehealth: Payer: Self-pay

## 2015-07-26 NOTE — Telephone Encounter (Signed)
Left Voice Mail for pt to call back.   RE: Flu Vaccine for 2016  

## 2015-07-27 ENCOUNTER — Ambulatory Visit (INDEPENDENT_AMBULATORY_CARE_PROVIDER_SITE_OTHER): Payer: No Typology Code available for payment source | Admitting: Pulmonary Disease

## 2015-07-27 DIAGNOSIS — G4733 Obstructive sleep apnea (adult) (pediatric): Secondary | ICD-10-CM

## 2015-09-27 ENCOUNTER — Encounter: Payer: Self-pay | Admitting: Family

## 2015-10-11 NOTE — Progress Notes (Signed)
Entered in error

## 2015-10-26 ENCOUNTER — Telehealth: Payer: Self-pay

## 2015-10-26 NOTE — Telephone Encounter (Signed)
LVM for pt to call back as soon as possible.   RE: Flu Vaccine 2016-2017 

## 2015-11-14 ENCOUNTER — Ambulatory Visit: Payer: No Typology Code available for payment source | Admitting: Pulmonary Disease

## 2016-01-06 ENCOUNTER — Emergency Department (HOSPITAL_COMMUNITY): Payer: No Typology Code available for payment source

## 2016-01-06 ENCOUNTER — Emergency Department (HOSPITAL_COMMUNITY)
Admission: EM | Admit: 2016-01-06 | Discharge: 2016-01-06 | Disposition: A | Discharge status: Home / Self Care | Payer: No Typology Code available for payment source | Attending: Emergency Medicine | Admitting: Emergency Medicine

## 2016-01-06 DIAGNOSIS — Y929 Unspecified place or not applicable: Secondary | ICD-10-CM | POA: Insufficient documentation

## 2016-01-06 DIAGNOSIS — Y999 Unspecified external cause status: Secondary | ICD-10-CM | POA: Insufficient documentation

## 2016-01-06 DIAGNOSIS — Z792 Long term (current) use of antibiotics: Secondary | ICD-10-CM | POA: Insufficient documentation

## 2016-01-06 DIAGNOSIS — F17210 Nicotine dependence, cigarettes, uncomplicated: Secondary | ICD-10-CM | POA: Insufficient documentation

## 2016-01-06 DIAGNOSIS — Z23 Encounter for immunization: Secondary | ICD-10-CM | POA: Insufficient documentation

## 2016-01-06 DIAGNOSIS — S81852A Open bite, left lower leg, initial encounter: Secondary | ICD-10-CM | POA: Insufficient documentation

## 2016-01-06 DIAGNOSIS — S81851A Open bite, right lower leg, initial encounter: Secondary | ICD-10-CM | POA: Insufficient documentation

## 2016-01-06 DIAGNOSIS — Y939 Activity, unspecified: Secondary | ICD-10-CM | POA: Insufficient documentation

## 2016-01-06 DIAGNOSIS — I10 Essential (primary) hypertension: Secondary | ICD-10-CM | POA: Insufficient documentation

## 2016-01-06 DIAGNOSIS — W540XXA Bitten by dog, initial encounter: Secondary | ICD-10-CM | POA: Insufficient documentation

## 2016-01-06 MED ORDER — BACITRACIN ZINC 500 UNIT/GM EX OINT
1.0000 "application " | TOPICAL_OINTMENT | Freq: Once | CUTANEOUS | Status: AC
  Administered 2016-01-06: 1 via TOPICAL
  Filled 2016-01-06: qty 3.6

## 2016-01-06 MED ORDER — HYDROCODONE-ACETAMINOPHEN 5-325 MG PO TABS
1.0000 | ORAL_TABLET | ORAL | Status: AC
  Administered 2016-01-06: 1 via ORAL
  Filled 2016-01-06: qty 1

## 2016-01-06 MED ORDER — NAPROXEN 500 MG PO TABS: 500.0000 mg | ORAL_TABLET | Freq: Two times a day (BID) | ORAL | Status: DC

## 2016-01-06 MED ORDER — TETANUS-DIPHTH-ACELL PERTUSSIS 5-2.5-18.5 LF-MCG/0.5 IM SUSP
0.5000 mL | Freq: Once | INTRAMUSCULAR | Status: AC
  Administered 2016-01-06: 0.5 mL via INTRAMUSCULAR
  Filled 2016-01-06: qty 0.5

## 2016-01-06 MED ORDER — AMOXICILLIN-POT CLAVULANATE 875-125 MG PO TABS: 1.0000 | ORAL_TABLET | Freq: Two times a day (BID) | ORAL | Status: DC

## 2016-01-06 NOTE — ED Notes (Signed)
Bed: [id number] Expected date:  Expected time:  Means of arrival:  Comments: [age] M dog bite

## 2016-01-06 NOTE — Discharge Instructions (Signed)

## 2016-01-06 NOTE — ED Notes (Signed)
Pt reports was bit by a dog to his bilateral calves. Bleeding controlled by kerlex gauze placed by EMS. Pt is alert and oriented x4. No acute distress.

## 2016-01-06 NOTE — ED Provider Notes (Signed)
CSN: [id number]     Arrival date & time 01/06/16  1942 History   First MD Initiated Contact with Patient 01/06/16 1947     Chief Complaint  Patient presents with  . Animal Bite   HPI Patient presents to the emergency room with complaints of bilateral lower leg dog bites. Patient was at a picnic outside. A neighbor's pitbull ran outside and without provocation bit the patient on the lower leg. The neighbor stated that the dog's vaccinations are up-to-date.  She did not have the paperwork with her though. Patient denies any numbness or weakness. He is having moderate pain in both calf. He is not sure when his last tetanus shot was. Past Medical History  Diagnosis Date  . HTN (hypertension)   . GERD (gastroesophageal reflux disease)   . LV dysfunction     a. 10/2013:  EF 45-50% with followup echo 55-60%.  . Cardiac tamponade 10/2013  . Pericarditis     a. 10/2013: Acute pericarditis with cardiac tamponade s/p pericardiocentesis.  . Morbid obesity   . Enlarged pulmonary artery     a. Mild enlargement of pulmonary artery by CT 11/01/13.  Marland Kitchen Hx of echocardiogram     Echo (12/14/13):  Mod LVH, EF 50-55%, no RWMA, trivial Eff.  . Sleep apnea    Past Surgical History  Procedure Laterality Date  . Fluid drained from heart    . Pericardial tap N/A 11/02/2013    Procedure: PERICARDIAL TAP;  Surgeon: Lennette Bihari, MD;  Location: [facility] CATH LAB;  Service: Cardiovascular;  Laterality: N/A;   Family History  Problem Relation Age of Onset  . CAD      Aunt with CABG  . Heart failure Father   . Heart attack Father   . Kidney cancer Father   . Pancreatic cancer Paternal Grandmother   . Stroke Neg Hx   . Hypertension Mother   . Healthy Maternal Grandmother   . Healthy Maternal Grandfather   . Healthy Paternal Grandfather    Social History  Substance Use Topics  . Smoking status: Current Some Day Smoker -- 0.00 packs/day for 3 years    Types: Cigars, Cigarettes  . Smokeless tobacco: Never Used   Comment: 1 cigar daily  . Alcohol Use: Yes     Comment: occasional    Review of Systems  All other systems reviewed and are negative.     Allergies  Review of patient's allergies indicates no known allergies.  Home Medications   Prior to Admission medications   Medication Sig Start Date End Date Taking? Authorizing Provider  albuterol (PROVENTIL HFA;VENTOLIN HFA) 108 (90 BASE) MCG/ACT inhaler Inhale 1-2 puffs into the lungs every 6 (six) hours as needed for wheezing or shortness of breath. 04/02/15   Eyvonne Mechanic, PA-C  amoxicillin-clavulanate (AUGMENTIN) 875-125 MG tablet Take 1 tablet by mouth 2 (two) times daily. 01/06/16   Linwood Dibbles, MD  ciprofloxacin-hydrocortisone ([facility]) otic suspension Place 3 drops into the left ear 2 (two) times daily. X 7 days 04/09/15   Trixie Dredge, PA-C  clindamycin (CLEOCIN) 300 MG capsule Take 1 capsule (300 mg total) by mouth 3 (three) times daily. 03/31/15   Charm Rings, MD  DM-Doxylamine-Acetaminophen (NYQUIL COLD & FLU PO) Take 2 each by mouth 3 (three) times daily as needed (fever/sore throat).    Historical Provider, MD  hydrochlorothiazide (HYDRODIURIL) 25 MG tablet Take 1 tablet (25 mg total) by mouth daily. 02/16/14   Beatrice Lecher, PA-C  Lorcaserin HCl (  BELVIQ) 10 MG TABS Take 10 mg by mouth 2 (two) times daily. 03/16/15   Veryl SpeakGregory D Calone, FNP  mupirocin ointment (BACTROBAN) 2 % Place 1 application into the nose 2 (two) times daily. For 2 weeks 03/31/15   Charm RingsErin J Honig, MD  naproxen (NAPROSYN) 500 MG tablet Take 1 tablet (500 mg total) by mouth 2 (two) times daily. 01/06/16   Linwood DibblesJon Aleighna Wojtas, MD  neomycin-polymyxin-hydrocortisone (CORTISPORIN) otic solution 3-4 Drops, 4 times per day for 7 days 04/05/15   Ozella Rocksavid J Merrell, MD  predniSONE (DELTASONE) 50 MG tablet Take daily with breakfast 04/05/15   Ozella Rocksavid J Merrell, MD  pseudoephedrine (SUDAFED) 60 MG tablet Take 1 tablet (60 mg total) by mouth every 6 (six) hours as needed for congestion (and to assist ear  pressure). 04/09/15   Trixie DredgeEmily West, PA-C   BP 156/106 mmHg  Pulse 86  Temp(Src) 98.4 F (36.9 C) (Oral)  SpO2 95% Physical Exam  Constitutional: He appears well-developed and well-nourished. No distress.  HENT:  Head: Normocephalic and atraumatic.  Right Ear: External ear normal.  Left Ear: External ear normal.  Eyes: Conjunctivae are normal. Right eye exhibits no discharge. Left eye exhibits no discharge. No scleral icterus.  Neck: Neck supple. No tracheal deviation present.  Cardiovascular: Normal rate.   Pulmonary/Chest: Effort normal. No stridor. No respiratory distress.  Musculoskeletal: He exhibits tenderness. He exhibits no edema.  Puncture wounds noted bilateral CALF muscles consistent with dog bites. No active bleeding, neurovascularly intact  Neurological: He is alert. Cranial nerve deficit: no gross deficits.  Skin: Skin is warm and dry. No rash noted.  Psychiatric: He has a normal mood and affect.  Nursing note and vitals reviewed.   ED Course  Procedures (including critical care time) Labs Review Labs Reviewed - No data to display  Imaging Review Dg Tibia/fibula Left  01/06/2016  CLINICAL DATA:  Patient status post dog bite to the calves bilaterally. Initial encounter. EXAM: LEFT TIBIA AND FIBULA - 2 VIEW COMPARISON:  None. FINDINGS: Normal anatomic alignment. No evidence for acute fracture or dislocation. No evidence for radiopaque foreign body. IMPRESSION: No evidence for radiopaque foreign body. No acute osseous abnormality. Electronically Signed   By: Annia Beltrew  Davis M.D.   On: 01/06/2016 20:49   Dg Tibia/fibula Right  01/06/2016  CLINICAL DATA:  Bit by dog at the calves.  Initial encounter. EXAM: RIGHT TIBIA AND FIBULA - 2 VIEW COMPARISON:  None. FINDINGS: There is no evidence of fracture or dislocation. The known soft tissue wound is not well characterized. No radiopaque foreign bodies are seen. The tibia and fibula appear grossly intact. The ankle mortise is  incompletely assessed, but appears grossly unremarkable. No knee joint effusion is seen. A fabella is noted. IMPRESSION: No evidence of fracture or dislocation. No radiopaque foreign bodies seen. Electronically Signed   By: Roanna RaiderJeffery  Chang M.D.   On: 01/06/2016 20:52   I have personally reviewed and evaluated these images and lab results as part of my medical decision-making.    MDM   Final diagnoses:  Dog bite    Wound irrigated in the ED.  Abx ointment and dressing applied.  Tetanus updated.  Rx augmentin.  Dog has been quarantined.  Low risk for rabies. Will not start empiric rabies    Linwood DibblesJon Tyrann Donaho, MD 01/06/16 2128

## 2016-07-11 IMAGING — DX DG CHEST 2V
2 series · 2 of 2 positions shown · non-contrast
Comparison: None.

CLINICAL DATA: One day history of productive cough. Acute onset of
wheezing earlier today. Chest pain with coughing. Personal history
of acute pericarditis in October 2013 requiring pericardiocentesis.

EXAM:
CHEST  2 VIEW

[chest pa]
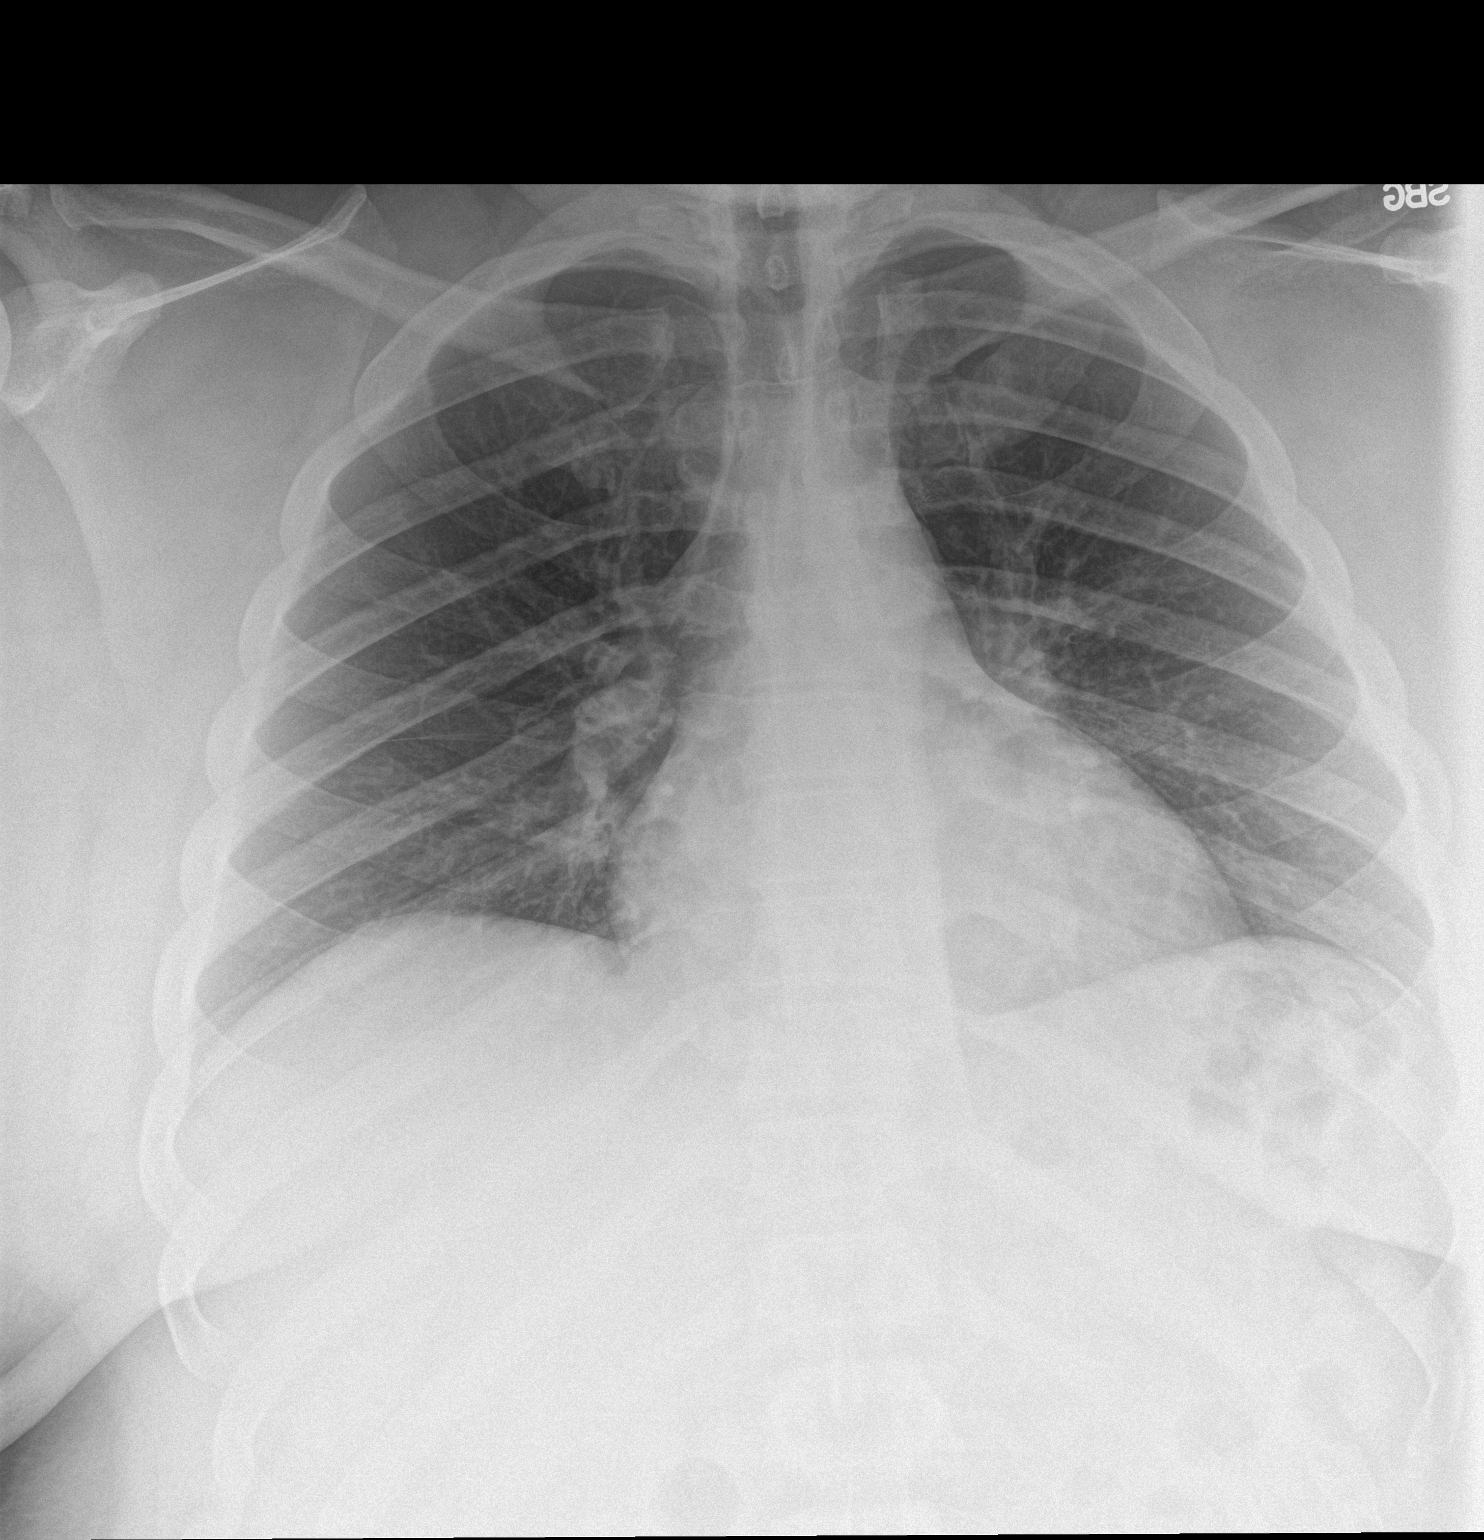

[chest lat]
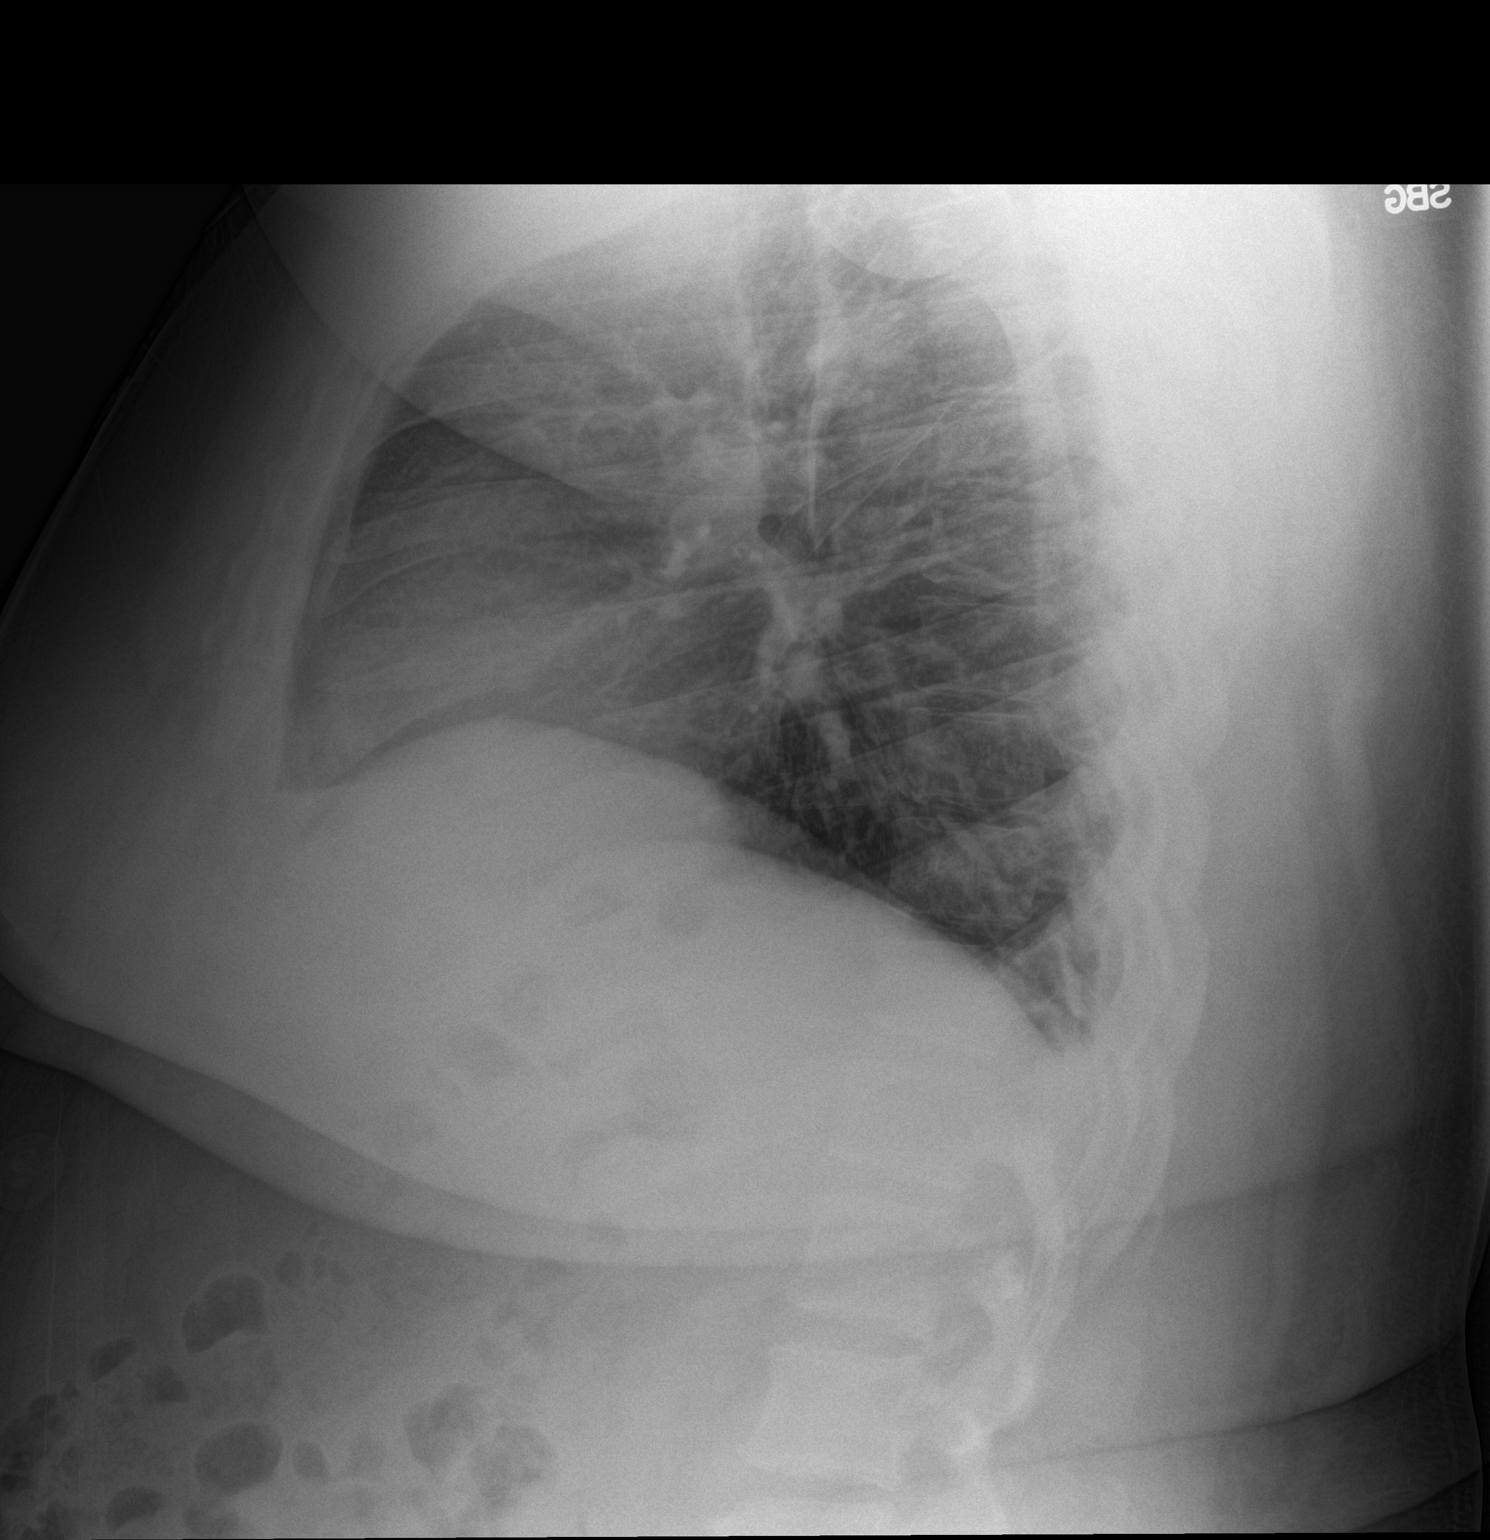

[2 of 2 positions shown; findings below may reference images not displayed]

FINDINGS: Suboptimal inspiration due to body habitus accounts for crowded
bronchovascular markings, especially in the bases, and accentuates
the cardiac silhouette. Taking this into account, cardiac silhouette
normal in size, unchanged from the prior examinations with the
exception of the 11/01/2013 chest x-ray at the time of the
pericardial effusion. Mild to moderate central bronchial wall
thickening, more so than on prior examinations. Lungs otherwise
clear. No localized airspace consolidation. No pleural effusions. No
pneumothorax. Normal pulmonary vascularity. Visualized bony thorax
intact.
IMPRESSION: Mild to moderate changes of acute bronchitis and/or asthma without
focal airspace pneumonia.

## 2016-10-16 ENCOUNTER — Ambulatory Visit (HOSPITAL_COMMUNITY)
Admission: EM | Admit: 2016-10-16 | Discharge: 2016-10-16 | Disposition: A | Discharge status: Home / Self Care | Payer: No Typology Code available for payment source | Attending: Family | Admitting: Family

## 2016-10-16 ENCOUNTER — Encounter (HOSPITAL_COMMUNITY): Payer: Self-pay | Admitting: Emergency Medicine

## 2016-10-16 DIAGNOSIS — R112 Nausea with vomiting, unspecified: Secondary | ICD-10-CM

## 2016-10-16 DIAGNOSIS — R197 Diarrhea, unspecified: Secondary | ICD-10-CM

## 2016-10-16 MED ORDER — ONDANSETRON 4 MG PO TBDP
ORAL_TABLET | ORAL | Status: AC
  Filled 2016-10-16: qty 2

## 2016-10-16 MED ORDER — ONDANSETRON HCL 8 MG PO TABS: 8.0000 mg | ORAL_TABLET | Freq: Three times a day (TID) | ORAL | 0 refills | Status: DC | PRN

## 2016-10-16 MED ORDER — ONDANSETRON 4 MG PO TBDP
8.0000 mg | ORAL_TABLET | Freq: Once | ORAL | Status: AC
  Administered 2016-10-16: 8 mg via ORAL

## 2016-10-16 NOTE — Discharge Instructions (Addendum)
You were given Zofran oral-dissolving tablet today to help with nausea and vomiting. You may continue Zofran 8mg  every 8 hours as needed for nausea. Drink clear liquids today- then start with toast, crackers, bananas, applesauce and advance diet as tolerated. Follow-up with your primary care provider in 3 to 4 days if not improving.

## 2016-10-16 NOTE — ED Provider Notes (Signed)
CSN: [id number]     Arrival date & time 10/16/16  1803 History   First MD Initiated Contact with Patient 10/16/16 1929     Chief Complaint  Patient presents with  . Abdominal Pain   (Consider location/radiation/quality/duration/timing/severity/associated sxs/prior Treatment) 36 year old male presents with nausea, vomiting and diarrhea that started around 2AM this morning. He continues to feel nauseous and having lower abdominal cramping/pain. He denies any fever, bladder or URI symptoms. He has taken Peptobismul with minimal relief. He tried eating bread today but vomited it back up. Has history of HTN but not currently on any medication.    The history is provided by the patient.    Past Medical History:  Diagnosis Date  . Cardiac tamponade 10/2013  . Enlarged pulmonary artery (HCC)    a. Mild enlargement of pulmonary artery by CT 11/01/13.  Marland Kitchen. GERD (gastroesophageal reflux disease)   . HTN (hypertension)   . Hx of echocardiogram    Echo (12/14/13):  Mod LVH, EF 50-55%, no RWMA, trivial Eff.  . LV dysfunction    a. 10/2013:  EF 45-50% with followup echo 55-60%.  . Morbid obesity (HCC)   . Pericarditis    a. 10/2013: Acute pericarditis with cardiac tamponade s/p pericardiocentesis.  . Sleep apnea    Past Surgical History:  Procedure Laterality Date  . fluid drained from heart    . PERICARDIAL TAP N/A 11/02/2013   Procedure: PERICARDIAL TAP;  Surgeon: Lennette Biharihomas A Kelly, MD;  [facility] C[facility];  Service: Cardiovascular;  Laterality: N/A;   Family History  Problem Relation Age of Onset  . CAD      Aunt with CABG  . Heart failure Father   . Heart attack Father   . Kidney cancer Father   . Pancreatic cancer Paternal Grandmother   . Stroke Neg Hx   . Hypertension Mother   . Healthy Maternal Grandmother   . Healthy Maternal Grandfather   . Healthy Paternal Grandfather    Social History  Substance Use Topics  . Smoking status: Current Some Day Smoker    Packs/day: 0.00    Years:  3.00    Types: Cigars, Cigarettes  . Smokeless tobacco: Never Used     Comment: 1 cigar daily  . Alcohol use Yes     Comment: occasional    Review of Systems  Constitutional: Positive for appetite change. Negative for chills, fatigue and fever.  HENT: Positive for sore throat (from vomiting). Negative for congestion.   Respiratory: Negative for cough, chest tightness, shortness of breath and wheezing.   Cardiovascular: Negative for chest pain.  Gastrointestinal: Positive for abdominal pain, diarrhea, nausea and vomiting. Negative for blood in stool.  Genitourinary: Negative for difficulty urinating, discharge, dysuria, hematuria, penile pain and testicular pain.  Musculoskeletal: Negative for arthralgias, back pain, myalgias and neck pain.  Skin: Negative for rash and wound.  Neurological: Negative for dizziness, syncope, weakness, light-headedness and headaches.  Hematological: Negative for adenopathy.    Allergies  Patient has no known allergies.  Home Medications   Prior to Admission medications   Medication Sig Start Date End Date Taking? Authorizing Provider  hydrochlorothiazide (HYDRODIURIL) 25 MG tablet Take 1 tablet (25 mg total) by mouth daily. 02/16/14   Beatrice LecherScott T Weaver, PA-C  Lorcaserin HCl (BELVIQ) 10 MG TABS Take 10 mg by mouth 2 (two) times daily. 03/16/15   Veryl SpeakGregory D Calone, FNP  ondansetron (ZOFRAN) 8 MG tablet Take 1 tablet (8 mg total) by mouth every 8 (  eight) hours as needed for nausea or vomiting. 10/16/16   Sudie Grumbling, NP   Meds Ordered and Administered this Visit   Medications  ondansetron (ZOFRAN-ODT) disintegrating tablet 8 mg (8 mg Oral Given 10/16/16 1946)    BP 151/85 (BP Location: Right Wrist)   Pulse 83   Temp 98.5 F (36.9 C) (Oral)   SpO2 97%  No data found.   Physical Exam  Constitutional: He is oriented to person, place, and time. He appears well-developed and well-nourished. No distress.  HENT:  Head: Normocephalic and atraumatic.  Right  Ear: External ear normal.  Left Ear: External ear normal.  Mouth/Throat: Oropharynx is clear and moist.  Neck: Normal range of motion. Neck supple.  Cardiovascular: Normal rate, regular rhythm and normal heart sounds.   Pulmonary/Chest: Effort normal and breath sounds normal. No respiratory distress.  Abdominal: Soft. Bowel sounds are normal. There is no hepatosplenomegaly. There is tenderness in the right lower quadrant and left lower quadrant. There is no rigidity, no rebound, no guarding and no CVA tenderness.  Musculoskeletal: Normal range of motion.  Neurological: He is alert and oriented to person, place, and time.  Skin: Skin is warm and dry. No rash noted.  Psychiatric: He has a normal mood and affect. His behavior is normal. Judgment and thought content normal.    Urgent Care Course     Procedures (including critical care time)  Labs Review Labs Reviewed - No data to display  Imaging Review No results found.   Visual Acuity Review  Right Eye Distance:   Left Eye Distance:   Bilateral Distance:    Right Eye Near:   Left Eye Near:    Bilateral Near:         MDM   1. Nausea vomiting and diarrhea    Discussed that he most likely has a viral illness. Recommend take Zofran 8mg  every 8 hours as needed for nausea. Recommend slowly increase fluid intake. Advance diet as tolerated. Rest. Note written for work. Recommend follow-up with his primary care provider in 3 to 4 days if not improving or go to ER if symptoms worsen.      Sudie Grumbling, NP 10/16/16 [phone]

## 2016-10-16 NOTE — ED Triage Notes (Signed)
Pt states he woke up about 0200 this morning with vomiting and diarrhea.  Pt states he still has lower abdominal cramping.

## 2017-01-02 ENCOUNTER — Ambulatory Visit (INDEPENDENT_AMBULATORY_CARE_PROVIDER_SITE_OTHER): Payer: No Typology Code available for payment source | Admitting: Family

## 2017-01-02 ENCOUNTER — Encounter: Payer: Self-pay | Admitting: Family

## 2017-01-02 VITALS — BP 126/84 | HR 101 | Temp 98.3°F | Resp 18 | Ht 69.0 in | Wt 381.4 lb

## 2017-01-02 DIAGNOSIS — E119 Type 2 diabetes mellitus without complications: Secondary | ICD-10-CM | POA: Diagnosis not present

## 2017-01-02 DIAGNOSIS — G4733 Obstructive sleep apnea (adult) (pediatric): Secondary | ICD-10-CM | POA: Diagnosis not present

## 2017-01-02 HISTORY — DX: Type 2 diabetes mellitus without complications: E11.9

## 2017-01-02 LAB — POCT GLYCOSYLATED HEMOGLOBIN (HGB A1C): Hemoglobin A1C: 12

## 2017-01-02 MED ORDER — METFORMIN HCL ER 500 MG PO TB24: 500.0000 mg | ORAL_TABLET | Freq: Every day | ORAL | 1 refills | Status: DC

## 2017-01-02 NOTE — Assessment & Plan Note (Signed)
BMI of 56. Recommend aggressive weight loss of 5-10% of current body weight with changes in nutrition to improve quality of nutritional intake to nutrient dense foods and decrease saturated fats and processed/sugary foods. Consider weight loss programs such as Nutrisystem or Weight Watchers. Emphasize higher protein and lower carbohydrate. Consider weight loss medication to aide in conservative treatment. Given multiple co-morbidities may consider bariatric surgery if conservative treatment is unsuccessful.

## 2017-01-02 NOTE — Patient Instructions (Addendum)
Thank you for choosing [facility].  SUMMARY AND INSTRUCTIONS:  [facility] [phone]  Please start taking the Metformin - 1 tablet 2x daily.  May cause some nausea, upset stomach or diarrhea which usually improves with time.  Please check on Trulicity, Bydureon, Farxiga, Jardiance, or Invokana.   Medication:  Your prescription(s) have been submitted to your pharmacy or been printed and provided for you. Please take as directed and contact our office if you believe you are having problem(s) with the medication(s) or have any questions.   Follow up:  If your symptoms worsen or fail to improve, please contact our office for further instruction, or in case of emergency go directly to the emergency room at the closest medical facility.     Type 2 Diabetes Mellitus, Diagnosis, Adult Type 2 diabetes (type 2 diabetes mellitus) is a long-term (chronic) disease. It may be caused by one or both of these problems:  Your body does not make enough of a hormone called insulin.  Your body does not react in a normal way to insulin that it makes. Insulin lets sugars (glucose) go into cells in the body. This gives you energy. If you have type 2 diabetes, sugars cannot get into cells. This causes high blood sugar (hyperglycemia). Your doctor will set treatment goals for you. Generally, you should have these blood sugar levels:  Before meals (preprandial): 80-130 mg/dL (8.2-9.5 mmol/L).  After meals (postprandial): below 180 mg/dL (10 mmol/L).  A1c (hemoglobin A1c) level: less than 7%. Follow these instructions at home: Questions to Ask Your Doctor   You may want to ask these questions:  Do I need to meet with a diabetes educator?  Where can I find a support group for people with diabetes?  What equipment will I need to care for myself at home?  What diabetes medicines do I need? When should I take them?  How often do I need to check my blood sugar?  What number  can I call if I have questions?  When is my next doctor's visit? General instructions   Take over-the-counter and prescription medicines only as told by your doctor.  Keep all follow-up visits as told by your doctor. This is important. Contact a doctor if:  Your blood sugar is at or above 240 mg/dL (62.1 mmol/L) for 2 days in a row.  You have been sick or have had a fever for 2 days or more and you are not getting better.  You have any of these problems for more than 6 hours:  You cannot eat or drink.  You feel sick to your stomach (nauseous).  You throw up (vomit).  You have watery poop (diarrhea). Get help right away if:  Your blood sugar is lower than 54 mg/dL (3 mmol/L).  You get confused.  You have trouble:  Thinking clearly.  Breathing.  You have moderate or large ketone levels in your pee (urine). This information is not intended to replace advice given to you by your health care provider. Make sure you discuss any questions you have with your health care provider. Document Released: 05/07/2008 Document Revised: 01/04/2016 Document Reviewed: 09/01/2015 Elsevier Interactive Patient Education  2017 Elsevier Inc.  Diabetes Mellitus and Food It is important for you to manage your blood sugar (glucose) level. Your blood glucose level can be greatly affected by what you eat. Eating healthier foods in the appropriate amounts throughout the day at about the same time each day will help you control your blood  glucose level. It can also help slow or prevent worsening of your diabetes mellitus. Healthy eating may even help you improve the level of your blood pressure and reach or maintain a healthy weight. General recommendations for healthful eating and cooking habits include:  Eating meals and snacks regularly. Avoid going long periods of time without eating to lose weight.  Eating a diet that consists mainly of plant-based foods, such as fruits, vegetables, nuts,  legumes, and whole grains.  Using low-heat cooking methods, such as baking, instead of high-heat cooking methods, such as deep frying. Work with your dietitian to make sure you understand how to use the Nutrition Facts information on food labels. How can food affect me? Carbohydrates  Carbohydrates affect your blood glucose level more than any other type of food. Your dietitian will help you determine how many carbohydrates to eat at each meal and teach you how to count carbohydrates. Counting carbohydrates is important to keep your blood glucose at a healthy level, especially if you are using insulin or taking certain medicines for diabetes mellitus. Alcohol  Alcohol can cause sudden decreases in blood glucose (hypoglycemia), especially if you use insulin or take certain medicines for diabetes mellitus. Hypoglycemia can be a life-threatening condition. Symptoms of hypoglycemia (sleepiness, dizziness, and disorientation) are similar to symptoms of having too much alcohol. If your health care provider has given you approval to drink alcohol, do so in moderation and use the following guidelines:  Women should not have more than one drink per day, and men should not have more than two drinks per day. One drink is equal to:  12 oz of beer.  5 oz of wine.  1 oz of hard liquor.  Do not drink on an empty stomach.  Keep yourself hydrated. Have water, diet soda, or unsweetened iced tea.  Regular soda, juice, and other mixers might contain a lot of carbohydrates and should be counted. What foods are not recommended? As you make food choices, it is important to remember that all foods are not the same. Some foods have fewer nutrients per serving than other foods, even though they might have the same number of calories or carbohydrates. It is difficult to get your body what it needs when you eat foods with fewer nutrients. Examples of foods that you should avoid that are high in calories and  carbohydrates but low in nutrients include:  Trans fats (most processed foods list trans fats on the Nutrition Facts label).  Regular soda.  Juice.  Candy.  Sweets, such as cake, pie, doughnuts, and cookies.  Fried foods. What foods can I eat? Eat nutrient-rich foods, which will nourish your body and keep you healthy. The food you should eat also will depend on several factors, including:  The calories you need.  The medicines you take.  Your weight.  Your blood glucose level.  Your blood pressure level.  Your cholesterol level. You should eat a variety of foods, including:  Protein.  Lean cuts of meat.  Proteins low in saturated fats, such as fish, egg whites, and beans. Avoid processed meats.  Fruits and vegetables.  Fruits and vegetables that may help control blood glucose levels, such as apples, mangoes, and yams.  Dairy products.  Choose fat-free or low-fat dairy products, such as milk, yogurt, and cheese.  Grains, bread, pasta, and rice.  Choose whole grain products, such as multigrain bread, whole oats, and brown rice. These foods may help control blood pressure.  Fats.  Foods containing healthful  fats, such as nuts, avocado, olive oil, canola oil, and fish. Does everyone with diabetes mellitus have the same meal plan? Because every person with diabetes mellitus is different, there is not one meal plan that works for everyone. It is very important that you meet with a dietitian who will help you create a meal plan that is just right for you. This information is not intended to replace advice given to you by your health care provider. Make sure you discuss any questions you have with your health care provider. Document Released: 04/25/2005 Document Revised: 01/04/2016 Document Reviewed: 06/25/2013 Elsevier Interactive Patient Education  2017 [employer]

## 2017-01-02 NOTE — Assessment & Plan Note (Signed)
In office A1c of 12.0 consistent with new onset diabetes. Start metformin. Discussed diabetes and the prevention associated with decreasing risk of end organ damage in the future. Educated regarding metformin and side effects. Refer to diabetes education. Information provided in AVS regarding diagnosis of diabetes and working on changing nutrition. Follow up in 2 weeks or sooner.

## 2017-01-02 NOTE — Assessment & Plan Note (Signed)
Encouraged to follow up with pulmonology to obtain CPAP which will help to reduce urinary frequency and improve A1c.

## 2017-01-02 NOTE — Progress Notes (Signed)
Subjective:    Patient ID: Caleb [name], male    DOB: 07-03-1981, 36 y.o.   MRN: [id number]  Chief Complaint  Patient presents with  . Urinary Frequency    having urinary frequency, fatigue, and increased thirst    HPI:  Caleb [name] is a 36 y.o. male who  has a past medical history of Cardiac tamponade (10/2013); Enlarged pulmonary artery (HCC); GERD (gastroesophageal reflux disease); HTN (hypertension); echocardiogram; LV dysfunction; Morbid obesity (HCC); Pericarditis; and Sleep apnea. and presents today for an acute office visit.  This is a new problem. Associated symptom of urinary frequency, increased thirst and fatigue have been going on for about 3 weeks. Severity is enough that it wakes him from his sleep. Does have a lot of urinary urgency. No increases in hunger. No fevers, discharge, or dysuria. Does have sleep apnea and not currently maintained on CPAP. Denies any modifying factors or attempted treatments. Has family history of diabetes.     No Known Allergies    Outpatient Medications Prior to Visit  Medication Sig Dispense Refill  . hydrochlorothiazide (HYDRODIURIL) 25 MG tablet Take 1 tablet (25 mg total) by mouth daily. 30 tablet 11  . Lorcaserin HCl (BELVIQ) 10 MG TABS Take 10 mg by mouth 2 (two) times daily. 30 tablet 0  . ondansetron (ZOFRAN) 8 MG tablet Take 1 tablet (8 mg total) by mouth every 8 (eight) hours as needed for nausea or vomiting. 10 tablet 0   No facility-administered medications prior to visit.       Past Surgical History:  Procedure Laterality Date  . fluid drained from heart    . PERICARDIAL TAP N/A 11/02/2013   Procedure: PERICARDIAL TAP;  Surgeon: Lennette Bihari, MD;  Location: [facility] CATH LAB;  Service: Cardiovascular;  Laterality: N/A;      Past Medical History:  Diagnosis Date  . Cardiac tamponade 10/2013  . Enlarged pulmonary artery (HCC)    a. Mild enlargement of pulmonary artery by CT 11/01/13.  Marland Kitchen GERD (gastroesophageal  reflux disease)   . HTN (hypertension)   . Hx of echocardiogram    Echo (12/14/13):  Mod LVH, EF 50-55%, no RWMA, trivial Eff.  . LV dysfunction    a. 10/2013:  EF 45-50% with followup echo 55-60%.  . Morbid obesity (HCC)   . Pericarditis    a. 10/2013: Acute pericarditis with cardiac tamponade s/p pericardiocentesis.  . Sleep apnea       Review of Systems  Constitutional: Positive for fatigue. Negative for chills and fever.  Eyes:       Negative for changes in vision.  Respiratory: Negative for chest tightness and shortness of breath.   Cardiovascular: Negative for chest pain, palpitations and leg swelling.  Endocrine: Positive for polydipsia and polyuria. Negative for polyphagia.  Neurological: Negative for dizziness, weakness, light-headedness and headaches.      Objective:    BP 126/84 (BP Location: Left Arm, Patient Position: Sitting, Cuff Size: Large)   Pulse (!) 101   Temp 98.3 F (36.8 C) (Oral)   Resp 18   Ht 5\' 9"  (1.753 m)   Wt (!) 381 lb 6.4 oz (173 kg)   SpO2 96%   BMI 56.32 kg/m  Nursing note and vital signs reviewed.  Physical Exam  Constitutional: He is oriented to person, place, and time. He appears well-developed and well-nourished. No distress.  Cardiovascular: Normal rate, regular rhythm, normal heart sounds and intact distal pulses.   Pulmonary/Chest: Effort normal and breath  sounds normal.  Neurological: He is alert and oriented to person, place, and time.  Skin: Skin is warm and dry.  Psychiatric: He has a normal mood and affect. His behavior is normal. Judgment and thought content normal.       Assessment & Plan:   Problem List Items Addressed This Visit      Respiratory   OSA (obstructive sleep apnea)    Encouraged to follow up with pulmonology to obtain CPAP which will help to reduce urinary frequency and improve A1c.         Endocrine   Type 2 diabetes mellitus without complication, without long-term current use of insulin (HCC) -  Primary    In office A1c of 12.0 consistent with new onset diabetes. Start metformin. Discussed diabetes and the prevention associated with decreasing risk of end organ damage in the future. Educated regarding metformin and side effects. Refer to diabetes education. Information provided in AVS regarding diagnosis of diabetes and working on changing nutrition. Follow up in 2 weeks or sooner.       Relevant Medications   metFORMIN (GLUCOPHAGE XR) 500 MG 24 hr tablet   Other Relevant Orders   POCT HgB A1C (Completed)     Other   Morbid obesity (HCC)    BMI of 56. Recommend aggressive weight loss of 5-10% of current body weight with changes in nutrition to improve quality of nutritional intake to nutrient dense foods and decrease saturated fats and processed/sugary foods. Consider weight loss programs such as Nutrisystem or Weight Watchers. Emphasize higher protein and lower carbohydrate. Consider weight loss medication to aide in conservative treatment. Given multiple co-morbidities may consider bariatric surgery if conservative treatment is unsuccessful.       Relevant Medications   metFORMIN (GLUCOPHAGE XR) 500 MG 24 hr tablet       I have discontinued Mr. Veda CanningChandler's hydrochlorothiazide, Lorcaserin HCl, and ondansetron. I am also having him start on metFORMIN.   Meds ordered this encounter  Medications  . metFORMIN (GLUCOPHAGE XR) 500 MG 24 hr tablet    Sig: Take 1 tablet (500 mg total) by mouth daily with breakfast.    Dispense:  60 tablet    Refill:  1    Order Specific Question:   Supervising Provider    Answer:   Hillard DankerRAWFORD, ELIZABETH A [[id number]]     Follow-up: Return in about 2 weeks (around 01/16/2017), or if symptoms worsen or fail to improve.  Jeanine Luzalone, Torre [place], FNP

## 2017-01-17 ENCOUNTER — Ambulatory Visit (INDEPENDENT_AMBULATORY_CARE_PROVIDER_SITE_OTHER): Payer: No Typology Code available for payment source | Admitting: Family

## 2017-01-17 ENCOUNTER — Encounter: Payer: Self-pay | Admitting: Family

## 2017-01-17 VITALS — BP 136/88 | HR 87 | Temp 98.3°F | Resp 18 | Ht 69.0 in | Wt 381.4 lb

## 2017-01-17 DIAGNOSIS — E119 Type 2 diabetes mellitus without complications: Secondary | ICD-10-CM | POA: Diagnosis not present

## 2017-01-17 MED ORDER — GLUCOSE BLOOD VI STRP: ORAL_STRIP | 12 refills | Status: DC

## 2017-01-17 MED ORDER — ONETOUCH ULTRA MINI W/DEVICE KIT: PACK | 0 refills | Status: DC

## 2017-01-17 MED ORDER — SILDENAFIL CITRATE 20 MG PO TABS: ORAL_TABLET | ORAL | 0 refills | Status: DC

## 2017-01-17 MED ORDER — ONETOUCH SURESOFT LANCING DEV MISC: 1 refills | Status: DC

## 2017-01-17 NOTE — Assessment & Plan Note (Signed)
Type 2 diabetes appears to be improving with starting metformin and side effects improving. Blood glucose monitoring supplies sent to pharmacy. Continue current dosage of metformin. Declines pneumovax.  Not currently maintained on ACE/ARB for CAD risk reduction. Referral placed to diabetes education. Diabetic eye exam encouraged to be completed independently.

## 2017-01-17 NOTE — Progress Notes (Signed)
Subjective:    Patient ID: Caleb [name], male    DOB: [birth year], 36 y.o.   MRN: [id number]  Chief Complaint  Patient presents with  . Follow-up    diabetes    HPI:  Caleb [name] is a 36 y.o. male who  has a past medical history of Cardiac tamponade (10/2013); Enlarged pulmonary artery (HCC); GERD (gastroesophageal reflux disease); HTN (hypertension); echocardiogram; LV dysfunction; Morbid obesity ([place]); Pericarditis; and Sleep apnea. and presents today for a follow up office visit.  Diabetes - Recently started on metformin and reports taking the medication as prescribed and notes initially onset of diarrhea which has improved slowly. Increase in time to take an erection, but has also had improvement with urinary frequency and increased energy.    No Known Allergies    Outpatient Medications Prior to Visit  Medication Sig Dispense Refill  . metFORMIN (GLUCOPHAGE XR) 500 MG 24 hr tablet Take 1 tablet (500 mg total) by mouth daily with breakfast. 60 tablet 1   No facility-administered medications prior to visit.      Review of Systems  Eyes:       Negative for changes in vision.  Respiratory: Negative for chest tightness and shortness of breath.   Cardiovascular: Negative for chest pain, palpitations and leg swelling.  Endocrine: Negative for polydipsia, polyphagia and polyuria.  Neurological: Negative for dizziness, weakness, light-headedness and headaches.      Objective:    BP 136/88 (BP Location: Left Arm, Patient Position: Sitting, Cuff Size: Large)   Pulse 87   Temp 98.3 F (36.8 C) (Oral)   Resp 18   Ht '5\' 9"'  (1.753 m)   Wt (!) 381 lb 6.4 oz (173 kg)   SpO2 98%   BMI 56.32 kg/m  Nursing note and vital signs reviewed.  Physical Exam  Constitutional: He is oriented to person, place, and time. He appears well-developed and well-nourished. No distress.  Cardiovascular: Normal rate, regular rhythm, normal heart sounds and intact distal pulses.     Pulmonary/Chest: Effort normal and breath sounds normal.  Neurological: He is alert and oriented to person, place, and time.  Diabetic foot exam - bilateral feet are free from skin breakdown, cuts, and abrasions. Toenails are neatly trimmed. Pulses are intact and appropriate. Sensation is intact to monofilament bilaterally.  Skin: Skin is warm and dry.  Psychiatric: He has a normal mood and affect. His behavior is normal. Judgment and thought content normal.       Assessment & Plan:   Problem List Items Addressed This Visit      Endocrine   Type 2 diabetes mellitus without complication, without long-term current use of insulin ([place]) - Primary    Type 2 diabetes appears to be improving with starting metformin and side effects improving. Blood glucose monitoring supplies sent to pharmacy. Continue current dosage of metformin. Declines pneumovax.  Not currently maintained on ACE/ARB for CAD risk reduction. Referral placed to diabetes education. Diabetic eye exam encouraged to be completed independently.       Relevant Orders   Ambulatory referral to diabetic education       I am having Mr. Fennema start on glucose blood, ONE TOUCH SURESOFT, ONE TOUCH ULTRA MINI, and sildenafil. I am also having him maintain his metFORMIN.   Meds ordered this encounter  Medications  . glucose blood (ONE TOUCH ULTRA TEST) test strip    Sig: Use one strip per test. Test blood sugars 1-4 times daily as instructed.  Dispense:  100 each    Refill:  12    Substitution permissible per insurance coverage. Dx E11.9.    Order Specific Question:   Supervising Provider    Answer:   Pricilla Holm A [[id number]]  . Lancets Misc. (ONE TOUCH SURESOFT) MISC    Sig: Use 1 lancet per test. Test blood sugars 1-4 times per day as instructed.    Dispense:  1 each    Refill:  1    Substitution permissible per insurance coverage. Dx E11.9.    Order Specific Question:   Supervising Provider    Answer:   Pricilla Holm A [[id number]]  . Blood Glucose Monitoring Suppl (ONE TOUCH ULTRA MINI) w/Device KIT    Sig: Use meter to check blood sugars 1-4 times daily as instructed.    Dispense:  1 each    Refill:  0    Substitution permissible per insurance coverage. Dx E11.9.    Order Specific Question:   Supervising Provider    Answer:   Pricilla Holm A [[id number]]  . sildenafil (REVATIO) 20 MG tablet    Sig: Take 1-5 tablets daily as needed. Do not exceed 5 tablets in 1 day.    Dispense:  50 tablet    Refill:  0    Order Specific Question:   Supervising Provider    Answer:   Pricilla Holm A [[id number]]     Follow-up: Return in about 2 months (around 03/19/2017), or if symptoms worsen or fail to improve.  Mauricio Po, FNP

## 2017-01-17 NOTE — Patient Instructions (Signed)
Thank you for choosing [facility].  SUMMARY AND INSTRUCTIONS:  Check blood sugars 1-2 times daily.  They will call with your referral to diabetic education.  Medication:  Your prescription(s) have been submitted to your pharmacy or been printed and provided for you. Please take as directed and contact our office if you believe you are having problem(s) with the medication(s) or have any questions.  Follow up:  If your symptoms worsen or fail to improve, please contact our office for further instruction, or in case of emergency go directly to the emergency room at the closest medical facility.

## 2017-01-27 ENCOUNTER — Telehealth: Payer: Self-pay | Admitting: Family

## 2017-01-27 ENCOUNTER — Encounter: Payer: Self-pay | Admitting: Family

## 2017-01-27 MED ORDER — SILDENAFIL CITRATE 20 MG PO TABS: ORAL_TABLET | ORAL | 0 refills | Status: DC

## 2017-01-27 NOTE — Telephone Encounter (Signed)
Please resend sildenafil (REVATIO) 20 MG tablet  to    [facility] [place]

## 2017-01-27 NOTE — Telephone Encounter (Signed)
Rx sent to requested pharmacy

## 2017-02-18 ENCOUNTER — Ambulatory Visit (INDEPENDENT_AMBULATORY_CARE_PROVIDER_SITE_OTHER): Payer: No Typology Code available for payment source | Admitting: Family

## 2017-02-18 ENCOUNTER — Encounter: Payer: Self-pay | Admitting: Family

## 2017-02-18 DIAGNOSIS — E119 Type 2 diabetes mellitus without complications: Secondary | ICD-10-CM | POA: Diagnosis not present

## 2017-02-18 LAB — POCT GLYCOSYLATED HEMOGLOBIN (HGB A1C): Hemoglobin A1C: 9.4

## 2017-02-18 MED ORDER — METFORMIN HCL ER 500 MG PO TB24: 500.0000 mg | ORAL_TABLET | Freq: Two times a day (BID) | ORAL | 2 refills | Status: DC

## 2017-02-18 MED ORDER — PHENTERMINE HCL 37.5 MG PO CAPS: 37.5000 mg | ORAL_CAPSULE | ORAL | 1 refills | Status: DC

## 2017-02-18 NOTE — Progress Notes (Signed)
Subjective:    Patient ID: Caleb [name], male    DOB: 04/04/1981, 36 y.o.   MRN: [id number]  Chief Complaint  Patient presents with  . Follow-up    HPI:  Caleb [name] is a 36 y.o. male who  has a past medical history of Cardiac tamponade (10/2013); Enlarged pulmonary artery (HCC); GERD (gastroesophageal reflux disease); HTN (hypertension); echocardiogram; LV dysfunction; Morbid obesity ([place]); Pericarditis; and Sleep apnea. and presents today for an office visit.  Diabetes - Currently maintained on metformin. Reports taking the medication as prescribed and denies adverse side effects. Scheduled to see diabetic education. Due for a diabetic eye exam. Blood sugars at home when taken are improved. No excessive hunger, thirst or urination. Working on improving physical activity.   Lab Results  Component Value Date   HGBA1C 12.0 01/02/2017     No Known Allergies    Outpatient Medications Prior to Visit  Medication Sig Dispense Refill  . Blood Glucose Monitoring Suppl (ONE TOUCH ULTRA MINI) w/Device KIT Use meter to check blood sugars 1-4 times daily as instructed. 1 each 0  . glucose blood (ONE TOUCH ULTRA TEST) test strip Use one strip per test. Test blood sugars 1-4 times daily as instructed. 100 each 12  . Lancets Misc. (ONE TOUCH SURESOFT) MISC Use 1 lancet per test. Test blood sugars 1-4 times per day as instructed. 1 each 1  . sildenafil (REVATIO) 20 MG tablet Take 1-5 tablets daily as needed. Do not exceed 5 tablets in 1 day. 50 tablet 0  . metFORMIN (GLUCOPHAGE XR) 500 MG 24 hr tablet Take 1 tablet (500 mg total) by mouth daily with breakfast. 60 tablet 1   No facility-administered medications prior to visit.        Review of Systems  Eyes:       Negative for changes in vision.  Respiratory: Negative for chest tightness and shortness of breath.   Cardiovascular: Negative for chest pain, palpitations and leg swelling.  Endocrine: Negative for polydipsia,  polyphagia and polyuria.  Neurological: Negative for dizziness, weakness, light-headedness and headaches.      Objective:    BP 136/84 (BP Location: Left Arm, Patient Position: Sitting, Cuff Size: Large)   Pulse 86   Temp 97.8 F (36.6 C) (Oral)   Resp 18   Ht _0  (1.753 m)   Wt (!) 382 lb (173.3 kg)   SpO2 97%   BMI 56.41 kg/m  Nursing note and vital signs reviewed.   Physical Exam  Constitutional: He is oriented to person, place, and time. He appears well-developed and well-nourished. No distress.  Cardiovascular: Normal rate, regular rhythm, normal heart sounds and intact distal pulses.   Pulmonary/Chest: Effort normal and breath sounds normal.  Neurological: He is alert and oriented to person, place, and time.  Skin: Skin is warm and dry.  Psychiatric: He has a normal mood and affect. His behavior is normal. Judgment and thought content normal.       Assessment & Plan:   Problem List Items Addressed This Visit      Endocrine   Type 2 diabetes mellitus without complication, without long-term current use of insulin ([place])    In office A1c of 9.4 improved from previous 12.0. Diabetic foot exam completed today. Has appointment scheduled with diabetic education. Requesting assistance with weight loss and will start phentermine. Increase metformin XR. Continue to monitor blood sugars at home and follow low/modified carbohydrate intake. Follow up in 3 months or sooner if  needed.       Relevant Medications   metFORMIN (GLUCOPHAGE XR) 500 MG 24 hr tablet       I have changed Caleb [name] metFORMIN. I am also having him start on phentermine. Additionally, I am having him maintain his glucose blood, ONE TOUCH SURESOFT, ONE TOUCH ULTRA MINI, and sildenafil.   Meds ordered this encounter  Medications  . metFORMIN (GLUCOPHAGE XR) 500 MG 24 hr tablet    Sig: Take 1 tablet (500 mg total) by mouth 2 (two) times daily.    Dispense:  60 tablet    Refill:  2    Order Specific  Question:   Supervising Provider    Answer:   Pricilla Holm A [[id number]]  . phentermine 37.5 MG capsule    Sig: Take 1 capsule (37.5 mg total) by mouth every morning.    Dispense:  30 capsule    Refill:  1    Order Specific Question:   Supervising Provider    Answer:   Pricilla Holm A [[id number]]     Follow-up: Return in about 3 months (around 05/21/2017), or if symptoms worsen or fail to improve.  Caleb Po, FNP

## 2017-02-18 NOTE — Patient Instructions (Addendum)
Thank you for choosing [facility].  SUMMARY AND INSTRUCTIONS:  Randie HeinzGreat job so far!  Please increase your metformin to twice daily.   Start the phentermine daily.  Continue to follow up with diabetes education.  Follow up in 3 months.   Medication:  Your prescription(s) have been submitted to your pharmacy or been printed and provided for you. Please take as directed and contact our office if you believe you are having problem(s) with the medication(s) or have any questions.  Follow up:  If your symptoms worsen or fail to improve, please contact our office for further instruction, or in case of emergency go directly to the emergency room at the closest medical facility.

## 2017-02-18 NOTE — Assessment & Plan Note (Signed)
In office A1c of 9.4 improved from previous 12.0. Diabetic foot exam completed today. Has appointment scheduled with diabetic education. Requesting assistance with weight loss and will start phentermine. Increase metformin XR. Continue to monitor blood sugars at home and follow low/modified carbohydrate intake. Follow up in 3 months or sooner if needed.

## 2017-03-05 ENCOUNTER — Ambulatory Visit: Payer: No Typology Code available for payment source | Admitting: *Deleted

## 2017-04-16 IMAGING — CR DG TIBIA/FIBULA 2V*R*
4 series · 4 of 4 positions shown · non-contrast
Comparison: None.

CLINICAL DATA: Bit by dog at the calves.  Initial encounter.

EXAM:
RIGHT TIBIA AND FIBULA - 2 VIEW

[x tib-fib ap right (1 of 2)]
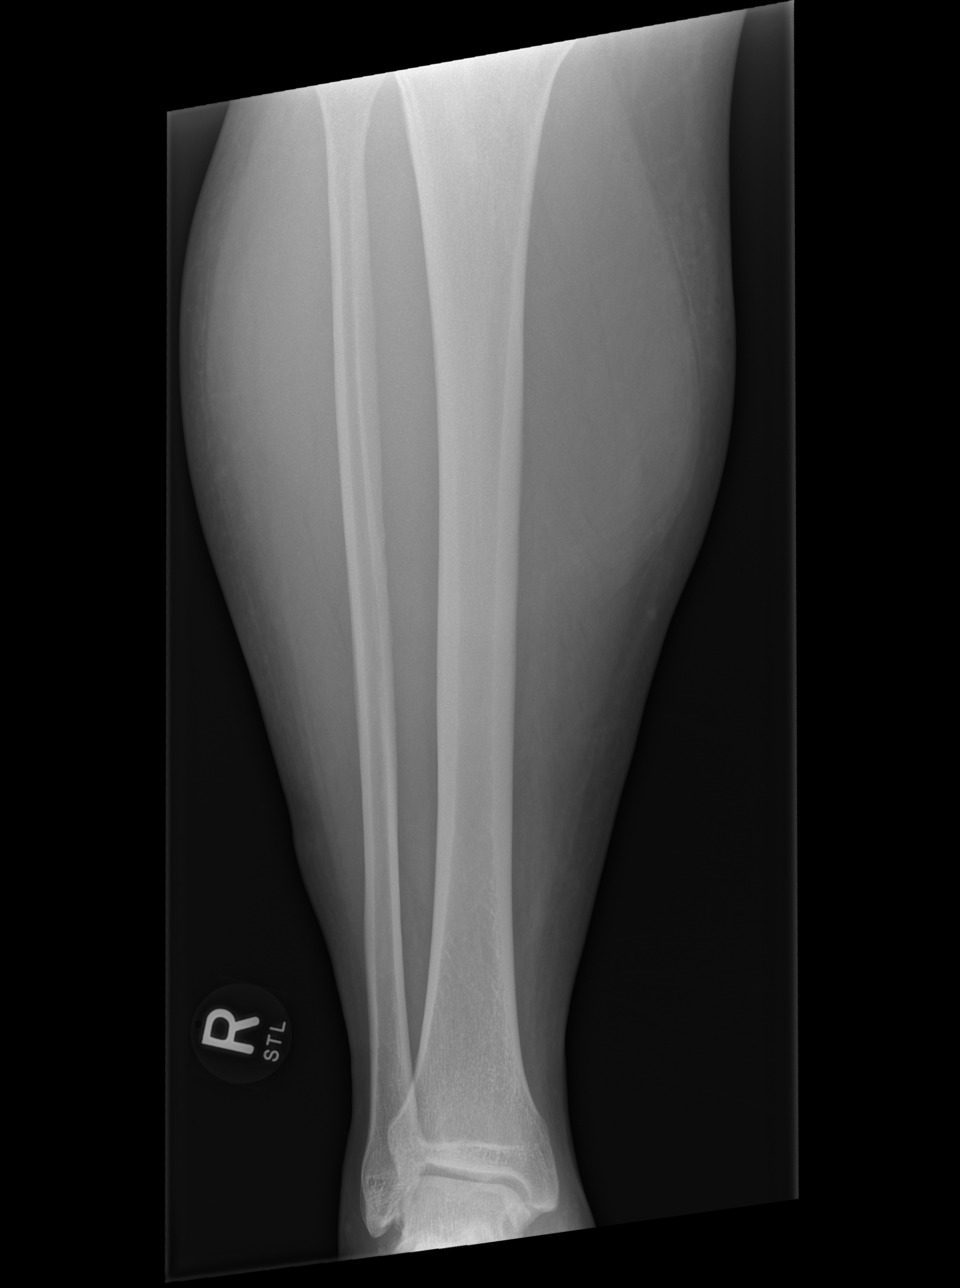

[x tib-fib ap right (2 of 2)]
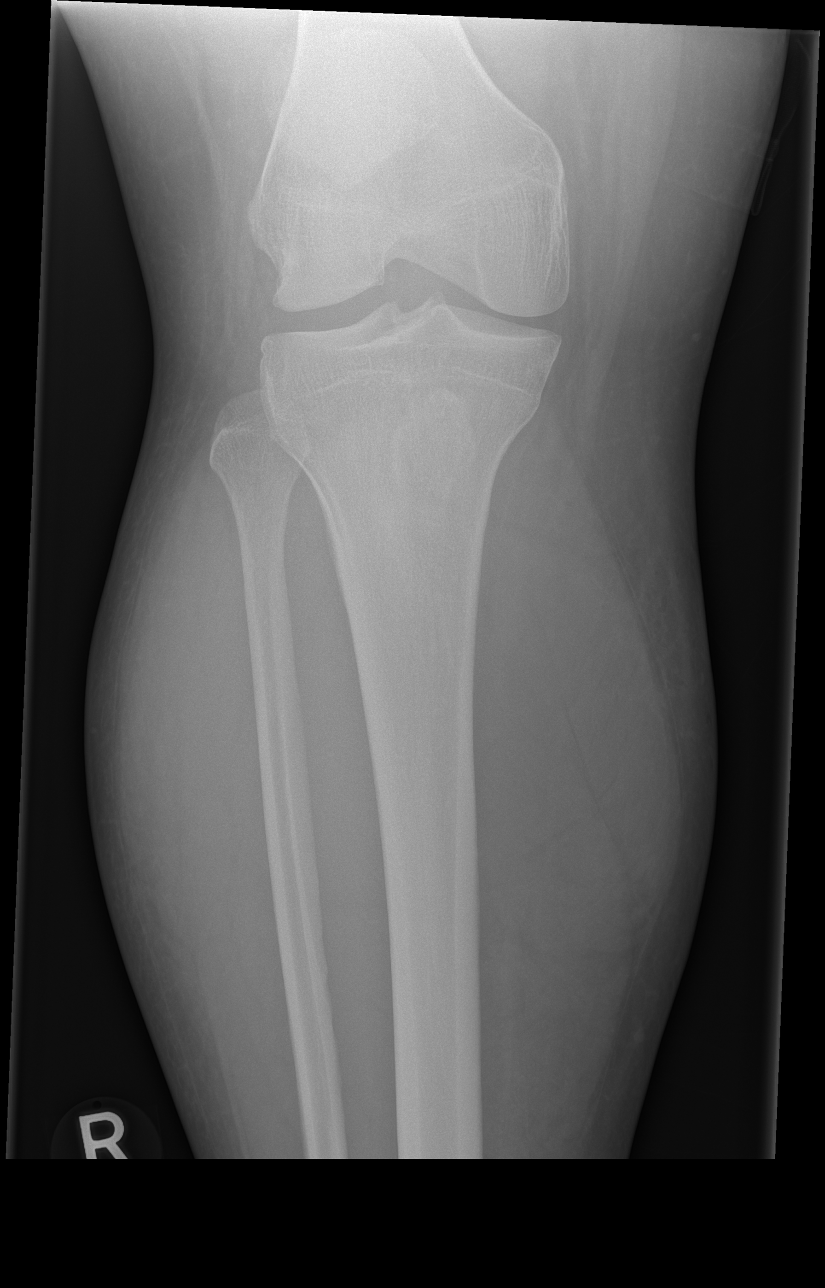

[x tib-fib lat right (1 of 2)]
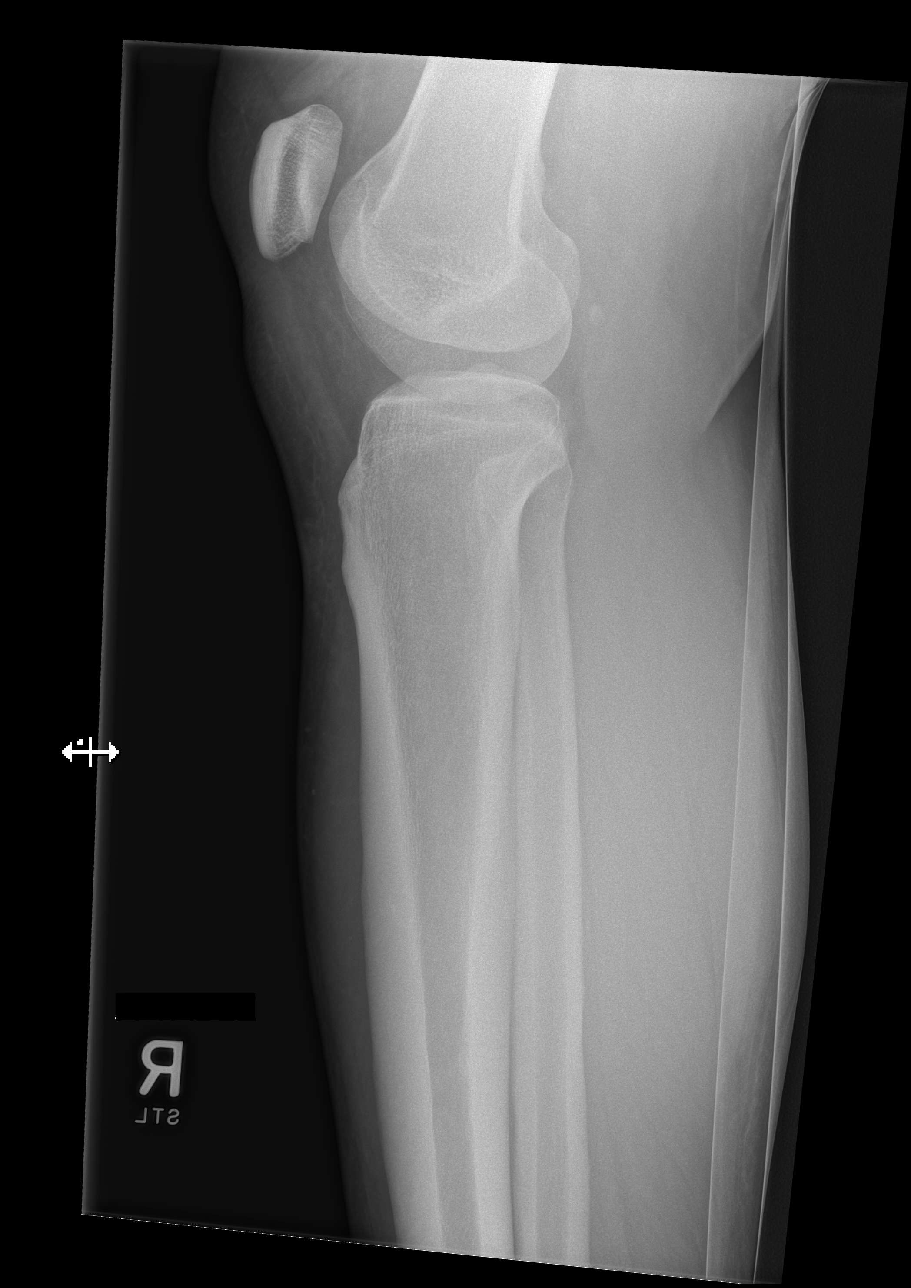

[x tib-fib lat right (2 of 2)]
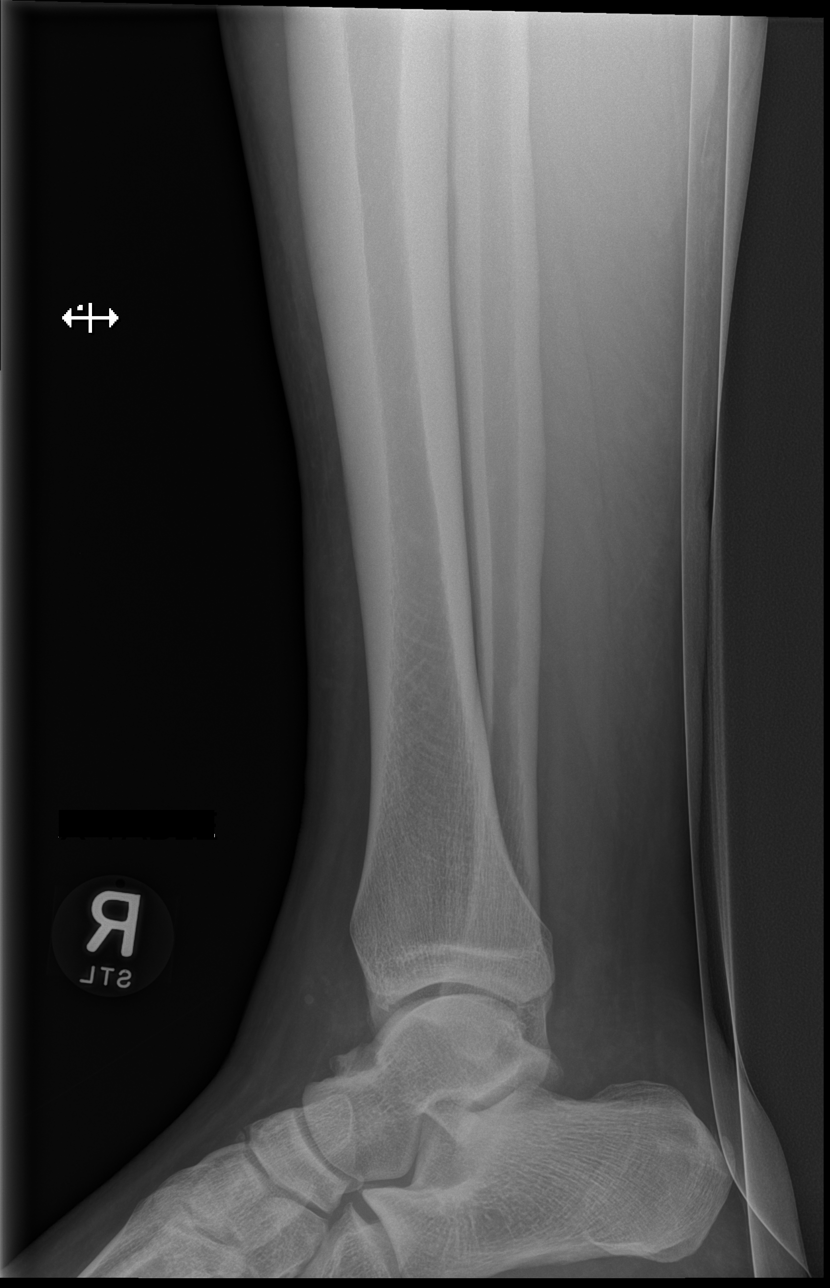

[4 of 4 positions shown; findings below may reference images not displayed]

FINDINGS: There is no evidence of fracture or dislocation. The known soft
tissue wound is not well characterized. No radiopaque foreign bodies
are seen.

The tibia and fibula appear grossly intact. The ankle mortise is
incompletely assessed, but appears grossly unremarkable. No knee
joint effusion is seen. A fabella is noted.
IMPRESSION: No evidence of fracture or dislocation. No radiopaque foreign bodies
seen.

## 2017-05-22 ENCOUNTER — Ambulatory Visit: Payer: No Typology Code available for payment source | Admitting: Family

## 2017-06-13 ENCOUNTER — Other Ambulatory Visit: Payer: Self-pay | Admitting: *Deleted

## 2017-06-29 ENCOUNTER — Ambulatory Visit (HOSPITAL_COMMUNITY)
Admission: EM | Admit: 2017-06-29 | Discharge: 2017-06-29 | Disposition: A | Discharge status: Home / Self Care | Payer: No Typology Code available for payment source | Attending: Family Medicine | Admitting: Family Medicine

## 2017-06-29 ENCOUNTER — Encounter (HOSPITAL_COMMUNITY): Payer: Self-pay | Admitting: Emergency Medicine

## 2017-06-29 ENCOUNTER — Ambulatory Visit (INDEPENDENT_AMBULATORY_CARE_PROVIDER_SITE_OTHER): Payer: No Typology Code available for payment source

## 2017-06-29 ENCOUNTER — Other Ambulatory Visit: Payer: Self-pay

## 2017-06-29 DIAGNOSIS — R062 Wheezing: Secondary | ICD-10-CM

## 2017-06-29 DIAGNOSIS — J4541 Moderate persistent asthma with (acute) exacerbation: Secondary | ICD-10-CM

## 2017-06-29 MED ORDER — ALBUTEROL SULFATE HFA 108 (90 BASE) MCG/ACT IN AERS
INHALATION_SPRAY | RESPIRATORY_TRACT | Status: AC
  Filled 2017-06-29: qty 6.7

## 2017-06-29 MED ORDER — ALBUTEROL SULFATE HFA 108 (90 BASE) MCG/ACT IN AERS
2.0000 | INHALATION_SPRAY | Freq: Once | RESPIRATORY_TRACT | Status: AC
  Administered 2017-06-29: 2 via RESPIRATORY_TRACT

## 2017-06-29 MED ORDER — IPRATROPIUM-ALBUTEROL 0.5-2.5 (3) MG/3ML IN SOLN
RESPIRATORY_TRACT | Status: AC
  Filled 2017-06-29: qty 3

## 2017-06-29 MED ORDER — PREDNISONE 20 MG PO TABS
ORAL_TABLET | ORAL | Status: AC
  Filled 2017-06-29: qty 3

## 2017-06-29 MED ORDER — PREDNISONE 20 MG PO TABS
60.0000 mg | ORAL_TABLET | Freq: Once | ORAL | Status: AC
  Administered 2017-06-29: 60 mg via ORAL

## 2017-06-29 MED ORDER — PREDNISONE 10 MG PO TABS
ORAL_TABLET | ORAL | 0 refills | Status: DC
Start: 2017-06-29 — End: 2017-07-23

## 2017-06-29 MED ORDER — IPRATROPIUM-ALBUTEROL 0.5-2.5 (3) MG/3ML IN SOLN
3.0000 mL | Freq: Once | RESPIRATORY_TRACT | Status: AC
  Administered 2017-06-29: 3 mL via RESPIRATORY_TRACT

## 2017-06-29 NOTE — ED Provider Notes (Addendum)
[facility]    CSN: [id number] Arrival date & time: 06/29/17  1621     History   Chief Complaint Chief Complaint  Patient presents with  . Wheezing    HPI Caleb [name] is a 36 y.o. male.   The history is provided by the patient. No language interpreter was used.  Wheezing  Severity:  Moderate Severity compared to prior episodes:  More severe Onset quality:  Gradual Timing:  Constant Progression:  Worsening Context: not smoke exposure   Relieved by:  Nothing Worsened by:  Nothing Ineffective treatments:  None tried Associated symptoms: no chest pain   Risk factors: not exposed to toxic fumes   Pt reports wheezing.  Pt has a history  of pericarditis in the past  Past Medical History:  Diagnosis Date  . Cardiac tamponade 10/2013  . Enlarged pulmonary artery ([place])    a. Mild enlargement of pulmonary artery by CT 11/01/13.  Marland Kitchen GERD (gastroesophageal reflux disease)   . HTN (hypertension)   . Hx of echocardiogram    Echo (12/14/13):  Mod LVH, EF 50-55%, no RWMA, trivial Eff.  . LV dysfunction    a. 10/2013:  EF 45-50% with followup echo 55-60%.  . Morbid obesity ([place])   . Pericarditis    a. 10/2013: Acute pericarditis with cardiac tamponade s/p pericardiocentesis.  . Sleep apnea     Patient Active Problem List   Diagnosis Date Noted  . Type 2 diabetes mellitus without complication, without long-term current use of insulin ([place]) 01/02/2017  . OSA (obstructive sleep apnea) 05/16/2014  . Cardiac tamponade 11/04/2013  . Morbid obesity ([place]) 11/04/2013  . Essential hypertension 11/04/2013  . Acute pericarditis 11/02/2013    Past Surgical History:  Procedure Laterality Date  . fluid drained from heart    . PERICARDIAL TAP N/A 11/02/2013   Performed by Troy Sine, MD at [facility]       Home Medications    Prior to Admission medications   Medication Sig Start Date End Date Taking? Authorizing Provider  Blood Glucose Monitoring Suppl (ONE  TOUCH ULTRA MINI) w/Device KIT Use meter to check blood sugars 1-4 times daily as instructed. 01/17/17   Golden Circle, FNP  glucose blood (ONE TOUCH ULTRA TEST) test strip Use one strip per test. Test blood sugars 1-4 times daily as instructed. 01/17/17   Golden Circle, FNP  Lancets Misc. (ONE TOUCH SURESOFT) MISC Use 1 lancet per test. Test blood sugars 1-4 times per day as instructed. 01/17/17   Golden Circle, FNP  metFORMIN (GLUCOPHAGE XR) 500 MG 24 hr tablet Take 1 tablet (500 mg total) by mouth 2 (two) times daily. 02/18/17   Golden Circle, FNP  phentermine 37.5 MG capsule Take 1 capsule (37.5 mg total) by mouth every morning. 02/18/17   Golden Circle, FNP  predniSONE (DELTASONE) 10 MG tablet 5,4,3,2,1 taper, 06/29/17   Caryl Ada K, PA-C  sildenafil (REVATIO) 20 MG tablet Take 1-5 tablets daily as needed. Do not exceed 5 tablets in 1 day. 01/27/17   Golden Circle, FNP    Family History Family History  Problem Relation Age of Onset  . Heart failure Father   . Heart attack Father   . Kidney cancer Father   . Pancreatic cancer Paternal Grandmother   . Hypertension Mother   . Healthy Maternal Grandmother   . Healthy Maternal Grandfather   . Healthy Paternal Grandfather   . CAD Unknown  Aunt with CABG  . Stroke Neg Hx     Social History Social History   Tobacco Use  . Smoking status: Current Some Day Smoker    Packs/day: 0.00    Years: 3.00    Pack years: 0.00    Types: Cigars, Cigarettes  . Smokeless tobacco: Never Used  . Tobacco comment: 1 cigar daily  Substance Use Topics  . Alcohol use: Yes    Comment: occasional  . Drug use: No     Allergies   Patient has no known allergies.   Review of Systems Review of Systems  Respiratory: Positive for wheezing.   Cardiovascular: Negative for chest pain.  All other systems reviewed and are negative.    Physical Exam Triage Vital Signs ED Triage Vitals  Enc Vitals Group     BP 06/29/17 1648  (!) 159/106     Pulse Rate 06/29/17 1648 87     Resp 06/29/17 1648 (!) 28     Temp 06/29/17 1648 98.6 F (37 C)     Temp Source 06/29/17 1648 Oral     SpO2 06/29/17 1648 98 %     Weight --      Height --      Head Circumference --      Peak Flow --      Pain Score 06/29/17 1646 4     Pain Loc --      Pain Edu? --      Excl. in [place]? --    No data found.  Updated Vital Signs BP (!) 159/106   Pulse 87   Temp 98.6 F (37 C) (Oral)   Resp (!) 28   SpO2 98%   Visual Acuity Right Eye Distance:   Left Eye Distance:   Bilateral Distance:    Right Eye Near:   Left Eye Near:    Bilateral Near:     Physical Exam  Constitutional: He appears well-developed and well-nourished.  HENT:  Head: Normocephalic and atraumatic.  Right Ear: External ear normal.  Left Ear: External ear normal.  Eyes: Conjunctivae are normal.  Neck: Neck supple.  Cardiovascular: Normal rate and regular rhythm. Exam reveals no friction rub.  Pulmonary/Chest: No respiratory distress. He has wheezes.  Abdominal: Soft. There is no tenderness.  Musculoskeletal: He exhibits no edema.  Neurological: He is alert.  Skin: Skin is warm and dry.  Psychiatric: He has a normal mood and affect.  Nursing note and vitals reviewed.    UC Treatments / Results  Labs (all labs ordered are listed, but only abnormal results are displayed) Labs Reviewed - No data to display  EKG  EKG Interpretation None       Radiology Dg Chest 2 View  Result Date: 06/29/2017 CLINICAL DATA:  Wheezing and productive cough yellow and white sputum x10 week. Easily tired over the past month. Cough is worse at night and when laying down. RN noted 28 respiration rate. Denies Fever currently NV. HTN. N diabetic. EXAM: CHEST  2 VIEW COMPARISON:  Chest x-rays dated 04/02/2015 and 12/15/2013. FINDINGS: The heart size and mediastinal contours are within normal limits. Both lungs are clear. The visualized skeletal structures are unremarkable.  IMPRESSION: No active cardiopulmonary disease. No evidence of pneumonia or pulmonary edema. Electronically Signed   By: Franki Cabot M.D.   On: 06/29/2017 17:23    Procedures Procedures (including critical care time)  Medications Ordered in UC Medications  ipratropium-albuterol (DUONEB) 0.5-2.5 (3) MG/3ML nebulizer solution 3 mL (3 mLs  Nebulization Given 06/29/17 1719)  albuterol (PROVENTIL HFA;VENTOLIN HFA) 108 (90 Base) MCG/ACT inhaler 2 puff (2 puffs Inhalation Given 06/29/17 1745)  predniSONE (DELTASONE) tablet 60 mg (60 mg Oral Given 06/29/17 1746)     Initial Impression / Assessment and Plan / UC Course  I have reviewed the triage vital signs and the nursing notes.  Pertinent labs & imaging results that were available during my care of the patient were reviewed by me and considered in my medical decision making (see chart for details).     EKG normal sinus normal qrs,  Normal st  Chest xray no acute.  Pt given duoneb.  And prednisone.  Pt reports improved breathing.   Pt given albuterol inhaler and rx for prednisone.   Pt advised to see his Md for recheck in 2-3 days   Final Clinical Impressions(s) / UC Diagnoses   Final diagnoses:  Moderate persistent asthma with exacerbation    ED Discharge Orders        Ordered    predniSONE (DELTASONE) 10 MG tablet     06/29/17 1735     An After Visit Summary was printed and given to the patient.   Controlled Substance Prescriptions [place] Controlled Substance Registry consulted? Not Applicable   Fransico Meadow, PA-C 06/29/17 2015    Fransico Meadow, PA-C 06/29/17 2015

## 2017-06-29 NOTE — ED Triage Notes (Addendum)
Wheezing for a week.  Easily tired over the past month.  Throat hurts with cough, coughing more than usual.  Cough is worse at night

## 2017-06-29 NOTE — Discharge Instructions (Signed)
Return if any problems.  Recheck with your MD in 2-3 days

## 2017-07-23 ENCOUNTER — Other Ambulatory Visit (INDEPENDENT_AMBULATORY_CARE_PROVIDER_SITE_OTHER): Payer: No Typology Code available for payment source

## 2017-07-23 ENCOUNTER — Encounter: Payer: Self-pay | Admitting: Internal Medicine

## 2017-07-23 ENCOUNTER — Other Ambulatory Visit: Payer: Self-pay | Admitting: Internal Medicine

## 2017-07-23 ENCOUNTER — Ambulatory Visit (INDEPENDENT_AMBULATORY_CARE_PROVIDER_SITE_OTHER): Payer: No Typology Code available for payment source | Admitting: Internal Medicine

## 2017-07-23 VITALS — BP 124/88 | HR 102 | Temp 98.1°F | Ht 69.0 in | Wt 382.0 lb

## 2017-07-23 DIAGNOSIS — K219 Gastro-esophageal reflux disease without esophagitis: Secondary | ICD-10-CM | POA: Insufficient documentation

## 2017-07-23 DIAGNOSIS — E119 Type 2 diabetes mellitus without complications: Secondary | ICD-10-CM | POA: Diagnosis not present

## 2017-07-23 DIAGNOSIS — Z0000 Encounter for general adult medical examination without abnormal findings: Secondary | ICD-10-CM | POA: Diagnosis not present

## 2017-07-23 DIAGNOSIS — G4733 Obstructive sleep apnea (adult) (pediatric): Secondary | ICD-10-CM

## 2017-07-23 DIAGNOSIS — I10 Essential (primary) hypertension: Secondary | ICD-10-CM | POA: Diagnosis not present

## 2017-07-23 DIAGNOSIS — Z0001 Encounter for general adult medical examination with abnormal findings: Secondary | ICD-10-CM | POA: Diagnosis not present

## 2017-07-23 DIAGNOSIS — Z23 Encounter for immunization: Secondary | ICD-10-CM | POA: Diagnosis not present

## 2017-07-23 LAB — LIPID PANEL
CHOL/HDL RATIO: 3
CHOLESTEROL: 154 mg/dL (ref 0–200)
HDL: 56 mg/dL (ref >39.00)
LDL CALC: 77 mg/dL (ref 0–99)
NONHDL: 97.65
Triglycerides: 105 mg/dL (ref 0.0–149.0)
VLDL: 21 mg/dL (ref 0.0–40.0)

## 2017-07-23 LAB — URINALYSIS, ROUTINE W REFLEX MICROSCOPIC
BILIRUBIN URINE: NEGATIVE
Hgb urine dipstick: NEGATIVE
KETONES UR: NEGATIVE
LEUKOCYTES UA: NEGATIVE
NITRITE: NEGATIVE
PH: 5.5 (ref 5.0–8.0)
TOTAL PROTEIN, URINE-UPE24: NEGATIVE
URINE GLUCOSE: NEGATIVE
UROBILINOGEN UA: 0.2 (ref 0.0–1.0)

## 2017-07-23 LAB — CBC WITH DIFFERENTIAL/PLATELET
BASOS ABS: 0.1 10*3/uL (ref 0.0–0.1)
Basophils Relative: 1.5 % (ref 0.0–3.0)
EOS ABS: 0.3 10*3/uL (ref 0.0–0.7)
Eosinophils Relative: 3.7 % (ref 0.0–5.0)
HEMATOCRIT: 44.8 % (ref 39.0–52.0)
HEMOGLOBIN: 14.2 g/dL (ref 13.0–17.0)
Lymphocytes Relative: 50.7 % — ABNORMAL HIGH (ref 12.0–46.0)
Lymphs Abs: 3.5 10*3/uL (ref 0.7–4.0)
MCHC: 31.7 g/dL (ref 30.0–36.0)
MCV: 72.6 fl — ABNORMAL LOW (ref 78.0–100.0)
Monocytes Absolute: 0.4 10*3/uL (ref 0.1–1.0)
Monocytes Relative: 5.4 % (ref 3.0–12.0)
NEUTROS ABS: 2.7 10*3/uL (ref 1.4–7.7)
Neutrophils Relative %: 38.7 % — ABNORMAL LOW (ref 43.0–77.0)
PLATELETS: 263 10*3/uL (ref 150.0–400.0)
RBC: 6.16 Mil/uL — ABNORMAL HIGH (ref 4.22–5.81)
RDW: 15.3 % (ref 11.5–15.5)
WBC: 6.9 10*3/uL (ref 4.0–10.5)

## 2017-07-23 LAB — HEPATIC FUNCTION PANEL
ALK PHOS: 40 U/L (ref 39–117)
ALT: 17 U/L (ref 0–53)
AST: 16 U/L (ref 0–37)
Albumin: 4.3 g/dL (ref 3.5–5.2)
BILIRUBIN DIRECT: 0.1 mg/dL (ref 0.0–0.3)
TOTAL PROTEIN: 7.6 g/dL (ref 6.0–8.3)
Total Bilirubin: 0.3 mg/dL (ref 0.2–1.2)

## 2017-07-23 LAB — HEMOGLOBIN A1C: HEMOGLOBIN A1C: 7.1 % — AB (ref 4.6–6.5)

## 2017-07-23 LAB — MICROALBUMIN / CREATININE URINE RATIO
Creatinine,U: 213.1 mg/dL
MICROALB UR: 1.3 mg/dL (ref 0.0–1.9)
Microalb Creat Ratio: 0.6 mg/g (ref 0.0–30.0)

## 2017-07-23 LAB — BASIC METABOLIC PANEL
BUN: 13 mg/dL (ref 6–23)
CHLORIDE: 101 meq/L (ref 96–112)
CO2: 25 mEq/L (ref 19–32)
CREATININE: 1.02 mg/dL (ref 0.40–1.50)
Calcium: 9 mg/dL (ref 8.4–10.5)
GFR: 107.52 mL/min (ref >60.00)
GLUCOSE: 112 mg/dL — AB (ref 70–99)
Potassium: 3.7 mEq/L (ref 3.5–5.1)
Sodium: 137 mEq/L (ref 135–145)

## 2017-07-23 LAB — TSH: TSH: 2.21 u[IU]/mL (ref 0.35–4.50)

## 2017-07-23 MED ORDER — GLUCOSE BLOOD VI STRP: ORAL_STRIP | 12 refills | Status: DC

## 2017-07-23 MED ORDER — ONETOUCH ULTRA MINI W/DEVICE KIT: PACK | 0 refills | Status: DC

## 2017-07-23 MED ORDER — ATORVASTATIN CALCIUM 10 MG PO TABS: 10.0000 mg | ORAL_TABLET | Freq: Every day | ORAL | 3 refills | Status: DC

## 2017-07-23 MED ORDER — LOSARTAN POTASSIUM 25 MG PO TABS: 25.0000 mg | ORAL_TABLET | Freq: Every day | ORAL | 3 refills | Status: DC

## 2017-07-23 MED ORDER — ONETOUCH SURESOFT LANCING DEV MISC: 1 refills | Status: DC

## 2017-07-23 NOTE — Assessment & Plan Note (Signed)
stable overall by history and exam, and pt to continue medical treatment as before,  to f/u any worsening symptoms or concerns 

## 2017-07-23 NOTE — Assessment & Plan Note (Signed)

## 2017-07-23 NOTE — Assessment & Plan Note (Signed)
stable overall by history and exam, recent data reviewed with pt, and pt to continue medical treatment as before,  to f/u any worsening symptoms or concerns, for f/u lab today 

## 2017-07-23 NOTE — Assessment & Plan Note (Addendum)
Overall stable off antihtn meds, but will need losartan 25 qd for renoprotective  In addition to the time spent performing CPE, I spent an additional 25 minutes face to face,in which greater than 50% of this time was spent in counseling and coordination of care for patient's acute illness as documented, including the differential dx, tx, further evaluation and other management of HTN, DM, OSA, DM and morbid obesity

## 2017-07-23 NOTE — Assessment & Plan Note (Signed)
Also for referral to pulm to start CPAP as he now has machine in hand

## 2017-07-23 NOTE — Assessment & Plan Note (Signed)
Chronic persistent, failed diet, exercise and phenterminel; ok for referral bariatric surgury for possible gastric sleave

## 2017-07-23 NOTE — Progress Notes (Signed)
Subjective:    Patient ID: Caleb [name], male    DOB: [birth year], 36 y.o.   MRN: [id number]  HPI  Here for wellness and f/u;  Overall doing ok;  Pt denies Chest pain, worsening SOB, DOE, wheezing, orthopnea, PND, worsening LE edema, palpitations, dizziness or syncope.  Pt denies neurological change such as new headache, facial or extremity weakness.   Pt states overall good compliance with treatment and medications, good tolerability, and has been trying to follow appropriate diet.  Pt denies worsening depressive symptoms, suicidal ideation or panic. No fever, night sweats, wt loss, loss of appetite, or other constitutional symptoms.  Pt states good ability with ADL's, has low fall risk, home safety reviewed and adequate, no other significant changes in hearing or vision, and only occasionally active with exercise.  Admits to occasional noncompliance with metformin. He is Ok on 2 pills in the AM when takes regularly, but diarrhea to restart after misses a day or two.  Works full time as medication courier with fairly active.  OIk for flu shot, declines prevnar for now.  Due for optho f/u. Has CPAP machine but needs f/u appt to start, with Dr Craige CottaSood   Pt denies polydipsia, polyuria, or low sugar symptoms such as weakness or confusion improved with po intake. Denies worsening reflux, abd pain, dysphagia, n/v, bowel change or blood. Past Medical History:  Diagnosis Date  . Cardiac tamponade 10/2013  . Enlarged pulmonary artery (HCC)    a. Mild enlargement of pulmonary artery by CT 11/01/13.  Marland Kitchen. GERD (gastroesophageal reflux disease)   . HTN (hypertension)   . Hx of echocardiogram    Echo (12/14/13):  Mod LVH, EF 50-55%, no RWMA, trivial Eff.  . LV dysfunction    a. 10/2013:  EF 45-50% with followup echo 55-60%.  . Morbid obesity (HCC)   . Pericarditis    a. 10/2013: Acute pericarditis with cardiac tamponade s/p pericardiocentesis.  . Sleep apnea   . Type 2 diabetes mellitus without complication,  without long-term current use of insulin (HCC) 01/02/2017   Past Surgical History:  Procedure Laterality Date  . fluid drained from heart    . PERICARDIAL TAP N/A 11/02/2013   Procedure: PERICARDIAL TAP;  Surgeon: Lennette Biharihomas A Kelly, MD;  [facility];  Service: Cardiovascular;  Laterality: N/A;    reports that he has been smoking cigars and cigarettes.  He has been smoking about 0.00 packs per day for the past 3.00 years. he has never used smokeless tobacco. He reports that he drinks alcohol. He reports that he does not use drugs. family history includes CAD in his unknown relative; Healthy in his maternal grandfather, maternal grandmother, and paternal grandfather; Heart attack in his father; Heart failure in his father; Hypertension in his mother; Kidney cancer in his father; Pancreatic cancer in his paternal grandmother. No Known Allergies Current Outpatient Medications on File Prior to Visit  Medication Sig Dispense Refill  . metFORMIN (GLUCOPHAGE XR) 500 MG 24 hr tablet Take 1 tablet (500 mg total) by mouth 2 (two) times daily. 60 tablet 2  . sildenafil (REVATIO) 20 MG tablet Take 1-5 tablets daily as needed. Do not exceed 5 tablets in 1 day. 50 tablet 0   No current facility-administered medications on file prior to visit.    Review of Systems Constitutional: Negative for other unusual diaphoresis, sweats, appetite or weight changes HENT: Negative for other worsening hearing loss, ear pain, facial swelling, mouth sores or neck stiffness.   Eyes:  Negative for other worsening pain, redness or other visual disturbance.  Respiratory: Negative for other stridor or swelling Cardiovascular: Negative for other palpitations or other chest pain  Gastrointestinal: Negative for worsening diarrhea or loose stools, blood in stool, distention or other pain Genitourinary: Negative for hematuria, flank pain or other change in urine volume.  Musculoskeletal: Negative for myalgias or other joint  swelling.  Skin: Negative for other color change, or other wound or worsening drainage.  Neurological: Negative for other syncope or numbness. Hematological: Negative for other adenopathy or swelling Psychiatric/Behavioral: Negative for hallucinations, other worsening agitation, SI, self-injury, or new decreased concentration All other system neg per pt    Objective:   Physical Exam BP 124/88   Pulse (!) 102   Temp 98.1 F (36.7 C) (Oral)   Ht 5\' 9"  (1.753 m)   Wt (!) 382 lb (173.3 kg)   SpO2 98%   BMI 56.41 kg/m  VS noted,  Constitutional: Pt is oriented to person, place, and time. Appears well-developed and well-nourished, in no significant distress and comfortable Head: Normocephalic and atraumatic  Eyes: Conjunctivae and EOM are normal. Pupils are equal, round, and reactive to light Right Ear: External ear normal without discharge Left Ear: External ear normal without discharge Nose: Nose without discharge or deformity Mouth/Throat: Oropharynx is without other ulcerations and moist  Neck: Normal range of motion. Neck supple. No JVD present. No tracheal deviation present or significant neck LA or mass Cardiovascular: Normal rate, regular rhythm, normal heart sounds and intact distal pulses.   Pulmonary/Chest: WOB normal and breath sounds without rales or wheezing  Abdominal: Soft. Bowel sounds are normal. NT. No HSM  Musculoskeletal: Normal range of motion. Exhibits no edema Lymphadenopathy: Has no other cervical adenopathy.  Neurological: Pt is alert and oriented to person, place, and time. Pt has normal reflexes. No cranial nerve deficit. Motor grossly intact, Gait intact Skin: Skin is warm and dry. No rash noted or new ulcerations Psychiatric:  Has normal mood and affect. Behavior is normal without agitation\ No other exam findings Lab Results  Component Value Date   WBC 6.9 07/23/2017   HGB 14.2 07/23/2017   HCT 44.8 07/23/2017   PLT 263.0 07/23/2017   GLUCOSE 112 (H)  07/23/2017   CHOL 154 07/23/2017   TRIG 105.0 07/23/2017   HDL 56.00 07/23/2017   LDLCALC 77 07/23/2017   ALT 17 07/23/2017   AST 16 07/23/2017   NA 137 07/23/2017   K 3.7 07/23/2017   CL 101 07/23/2017   CREATININE 1.02 07/23/2017   BUN 13 07/23/2017   CO2 25 07/23/2017   TSH 2.21 07/23/2017   HGBA1C 7.1 (H) 07/23/2017   MICROALBUR 1.3 07/23/2017      Assessment & Plan:

## 2017-07-23 NOTE — Patient Instructions (Addendum)
You had the flu shot today  Please take all new medication as prescribed - the losartan 25 mg to protect the kidneys from sugar  Please continue all other medications as before, and refills have been done if requested.  Please have the pharmacy call with any other refills you may need.  Please continue your efforts at being more active, low cholesterol diet, and weight control.  You are otherwise up to date with prevention measures today.  Please keep your appointments with your specialists as you may have planned  You will be contacted regarding the referral for: Eye doctor, General Surgury, and Pulmonary  Please go to the LAB in the Basement (turn left off the elevator) for the tests to be done today  You will be contacted by phone if any changes need to be made immediately.  Otherwise, you will receive a letter about your results with an explanation, but please check with MyChart first.  Please remember to sign up for MyChart if you have not done so, as this will be important to you in the future with finding out test results, communicating by private email, and scheduling acute appointments online when needed.  Please return in 6 months, or sooner if needed, with Lab testing done 3-5 days before

## 2017-08-25 ENCOUNTER — Ambulatory Visit (INDEPENDENT_AMBULATORY_CARE_PROVIDER_SITE_OTHER): Payer: No Typology Code available for payment source | Admitting: Pulmonary Disease

## 2017-08-25 ENCOUNTER — Encounter: Payer: Self-pay | Admitting: Pulmonary Disease

## 2017-08-25 VITALS — BP 126/88 | HR 87 | Ht 69.0 in | Wt 387.0 lb

## 2017-08-25 DIAGNOSIS — G4733 Obstructive sleep apnea (adult) (pediatric): Secondary | ICD-10-CM

## 2017-08-25 NOTE — Assessment & Plan Note (Signed)
He does have severe OSA and fortunately has been able to obtain a CPAP machine now.  Since he has gained weight since the original study we will set him up with auto CPAP settings 10-15 cm.  We will ask DME to do this for him and for some reason of there are not willing to due to his lack of insurance, will refer him to sleep lab and ask her sleep technician to set this up for him. We will obtain a download in 4 weeks and finding settings as required.  I also educated him on use of humidifier  Weight loss encouraged, compliance with goal of at least 4-6 hrs every night is the expectation. Advised against medications with sedative side effects Cautioned against driving when sleepy - understanding that sleepiness will vary on a day to day basis

## 2017-08-25 NOTE — Progress Notes (Signed)
Subjective:    Patient ID: Caleb [name], male    DOB: 1980-11-26, 37 y.o.   MRN: [id number]  HPI  37 year old morbidly obese man presents to establish care for obstructive sleep apnea. He was diagnosed 02/2014 with severe OSA when his blood study showed an AHI of 70/hour with lowest desaturation of 80%.  This was corrected by CPAP of 12 cm with a medium full facemask.  His weight was 370 pounds then. Unfortunately could not afford a CPAP machine then and was lost to follow-up.  He has now obtained a CPAP machine and presents again and would like to have it set up.  Unfortunately he does not have a power cord today he does seem to have a humidifier and.  Holes with medium full facemask.  He says that he obtained that on Impaired $900 per although he does appear to be used. Epworth sleepiness score is 12 and he reports sleepiness in various situations. Bedtime is around 12:30 PM, sleep latency is about an hour, sleeps on his stomach with 4 pillows, reports 2 nocturnal awakenings now that he is on diabetes pills and is out of bed at 6:15 AM on weekdays with tiredness and dryness of mouth. His weight has not changed since the last visit. 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   Past Medical History:  Diagnosis Date  . Cardiac tamponade 10/2013  . Enlarged pulmonary artery (HCC)    a. Mild enlargement of pulmonary artery by CT 11/01/13.  Marland Kitchen. GERD (gastroesophageal reflux disease)   . HTN (hypertension)   . Hx of echocardiogram    Echo (12/14/13):  Mod LVH, EF 50-55%, no RWMA, trivial Eff.  . LV dysfunction    a. 10/2013:  EF 45-50% with followup echo 55-60%.  . Morbid obesity (HCC)   . Pericarditis    a. 10/2013: Acute pericarditis with cardiac tamponade s/p  pericardiocentesis.  . Sleep apnea   . Type 2 diabetes mellitus without complication, without long-term current use of insulin (HCC) 01/02/2017     Past Surgical History:  Procedure Laterality Date  . fluid drained from heart    . PERICARDIAL TAP N/A 11/02/2013   Procedure: PERICARDIAL TAP;  Surgeon: Lennette Biharihomas A Kelly, MD;  [facility];  Service: Cardiovascular;  Laterality: N/A;    No Known Allergies   Social History   Socioeconomic History  . Marital status: Single    Spouse name: Not on file  . Number of children: 2  . Years of education: 7712  . Highest education level: Not on file  Social Needs  . Financial resource strain: Not on file  . Food insecurity - worry: Not on file  . Food insecurity - inability: Not on file  . Transportation needs - medical: Not on file  . Transportation needs - non-medical: Not on file  Occupational History  . Occupation: delivery Air traffic controllerdriver    Employer: [employer]  Tobacco Use  . Smoking status: Current Some Day Smoker    Packs/day: 0.00    Years: 3.00    Pack years: 0.00    Types: Cigars, Cigarettes  . Smokeless tobacco: Never Used  . Tobacco comment: 1 cigar daily  Substance and Sexual Activity  . Alcohol use: Yes  Comment: occasional  . Drug use: No  . Sexual activity: Not on file  Other Topics Concern  . Not on file  Social History Narrative   Fun: [employer] out with the kids   Denies religious beliefs effecting health care.      Family History  Problem Relation Age of Onset  . Heart failure Father   . Heart attack Father   . Kidney cancer Father   . Pancreatic cancer Paternal Grandmother   . Hypertension Mother   . Healthy Maternal Grandmother   . Healthy Maternal Grandfather   . Healthy Paternal Grandfather   . CAD Unknown        Aunt with CABG  . Stroke Neg Hx      Review of Systems  Constitutional: negative for anorexia, fevers and sweats  Eyes: negative for irritation, redness and visual disturbance    Ears, nose, mouth, throat, and face: negative for earaches, epistaxis, nasal congestion and sore throat  Respiratory: negative for cough, dyspnea on exertion, sputum and wheezing  Cardiovascular: negative for chest pain, dyspnea, lower extremity edema, orthopnea, palpitations and syncope  Gastrointestinal: negative for abdominal pain, constipation, diarrhea, melena, nausea and vomiting  Genitourinary:negative for dysuria, frequency and hematuria  Hematologic/lymphatic: negative for bleeding, easy bruising and lymphadenopathy  Musculoskeletal:negative for arthralgias, muscle weakness and stiff joints  Neurological: negative for coordination problems, gait problems, headaches and weakness  Endocrine: negative for diabetic symptoms including polydipsia, polyuria and weight loss     Objective:   Physical Exam  Gen. Pleasant, obese, in no distress, normal affect ENT - no lesions, no post nasal drip, class 2-3 airway Neck: No JVD, no thyromegaly, no carotid bruits Lungs: no use of accessory muscles, no dullness to percussion, decreased without rales or rhonchi  Cardiovascular: Rhythm regular, heart sounds  normal, no murmurs or gallops, no peripheral edema Abdomen: soft and non-tender, no hepatosplenomegaly, BS normal. Musculoskeletal: No deformities, no cyanosis or clubbing Neuro:  alert, non focal, no tremors        Assessment & Plan:

## 2017-08-25 NOTE — Patient Instructions (Signed)
We will set up your machine in auto settings 10-15 cm  expectation is that you use it at least 6 hours every night Check download on your next visit

## 2017-09-01 ENCOUNTER — Encounter: Payer: Self-pay | Admitting: Pulmonary Disease

## 2017-09-03 ENCOUNTER — Other Ambulatory Visit: Payer: Self-pay | Admitting: Internal Medicine

## 2017-09-03 ENCOUNTER — Encounter: Payer: Self-pay | Admitting: Internal Medicine

## 2017-09-03 NOTE — Telephone Encounter (Signed)
Forwarding to PCP.

## 2017-09-03 NOTE — Telephone Encounter (Signed)
I do not feel comfortable with new rx refill for phentermine as I done this was discussed at last visit, and I would not normally prescribe medication that provides minor temporary wt loss only  Ok for revatio

## 2017-10-10 ENCOUNTER — Ambulatory Visit: Payer: No Typology Code available for payment source | Admitting: Pulmonary Disease

## 2017-11-21 ENCOUNTER — Emergency Department (HOSPITAL_COMMUNITY)
Admission: EM | Admit: 2017-11-21 | Discharge: 2017-11-21 | Disposition: A | Discharge status: Home / Self Care | Payer: No Typology Code available for payment source | Attending: Emergency Medicine | Admitting: Emergency Medicine

## 2017-11-21 ENCOUNTER — Encounter (HOSPITAL_COMMUNITY): Payer: Self-pay | Admitting: Emergency Medicine

## 2017-11-21 DIAGNOSIS — Y999 Unspecified external cause status: Secondary | ICD-10-CM | POA: Insufficient documentation

## 2017-11-21 DIAGNOSIS — E119 Type 2 diabetes mellitus without complications: Secondary | ICD-10-CM | POA: Insufficient documentation

## 2017-11-21 DIAGNOSIS — S0592XA Unspecified injury of left eye and orbit, initial encounter: Secondary | ICD-10-CM | POA: Diagnosis present

## 2017-11-21 DIAGNOSIS — I10 Essential (primary) hypertension: Secondary | ICD-10-CM | POA: Diagnosis not present

## 2017-11-21 DIAGNOSIS — F17210 Nicotine dependence, cigarettes, uncomplicated: Secondary | ICD-10-CM | POA: Diagnosis not present

## 2017-11-21 DIAGNOSIS — Z7984 Long term (current) use of oral hypoglycemic drugs: Secondary | ICD-10-CM | POA: Diagnosis not present

## 2017-11-21 DIAGNOSIS — Y9389 Activity, other specified: Secondary | ICD-10-CM | POA: Insufficient documentation

## 2017-11-21 DIAGNOSIS — Z79899 Other long term (current) drug therapy: Secondary | ICD-10-CM | POA: Insufficient documentation

## 2017-11-21 DIAGNOSIS — Y929 Unspecified place or not applicable: Secondary | ICD-10-CM | POA: Insufficient documentation

## 2017-11-21 DIAGNOSIS — X58XXXA Exposure to other specified factors, initial encounter: Secondary | ICD-10-CM | POA: Insufficient documentation

## 2017-11-21 DIAGNOSIS — Z23 Encounter for immunization: Secondary | ICD-10-CM | POA: Insufficient documentation

## 2017-11-21 DIAGNOSIS — S0502XA Injury of conjunctiva and corneal abrasion without foreign body, left eye, initial encounter: Secondary | ICD-10-CM | POA: Insufficient documentation

## 2017-11-21 MED ORDER — TETANUS-DIPHTH-ACELL PERTUSSIS 5-2.5-18.5 LF-MCG/0.5 IM SUSP
0.5000 mL | Freq: Once | INTRAMUSCULAR | Status: AC
  Administered 2017-11-21: 0.5 mL via INTRAMUSCULAR
  Filled 2017-11-21: qty 0.5

## 2017-11-21 MED ORDER — FLUORESCEIN SODIUM 1 MG OP STRP
1.0000 | ORAL_STRIP | Freq: Once | OPHTHALMIC | Status: AC
  Administered 2017-11-21: 1 via OPHTHALMIC
  Filled 2017-11-21: qty 1

## 2017-11-21 MED ORDER — CIPROFLOXACIN HCL 0.3 % OP OINT: TOPICAL_OINTMENT | OPHTHALMIC | 0 refills | Status: DC

## 2017-11-21 MED ORDER — TETRACAINE HCL 0.5 % OP SOLN
2.0000 [drp] | Freq: Once | OPHTHALMIC | Status: AC
  Administered 2017-11-21: 2 [drp] via OPHTHALMIC
  Filled 2017-11-21: qty 4

## 2017-11-21 NOTE — ED Provider Notes (Signed)
[place] DEPT Provider Note   CSN: [id number] Arrival date & time: 11/21/17  1351     History   Chief Complaint Chief Complaint  Patient presents with  . Eye Pain    HPI Caleb [name] is a 37 y.o. male with history of morbid obesity, pericarditis, type 2 diabetes presents for evaluation of acute onset, progressively worsening left eye pain.  He states that yesterday evening while he was driving he felt his contact "getting dry "and so he rubbed his left eye.  He states he thinks that his contact then fell out of his eye.  He states that this morning when he awoke he had a yellow crust to the eyelashes and he noted a stinging burning pain to the left eye.  He states that this worsened when he attempted to put a contact in place.  He states that he cut the contact in place for a few more hours but the pain then worsened.  He notes that pain worsens when he blinks.  He denies pain with eye movements, fevers.  He does note that even when his contact was in place he had blurry vision as compared to the right eye.  He denies any fevers or chills.  No photophobia.  He states that he has contacts in which he can sleep in them.  He states he typically sleeps and then 3 nights in a row and then takes them out for a night.  He states that when he blinks it feels like there is still something stuck in his eye.  The history is provided by the patient.    Past Medical History:  Diagnosis Date  . Cardiac tamponade 10/2013  . Enlarged pulmonary artery ([place])    a. Mild enlargement of pulmonary artery by CT 11/01/13.  Marland Kitchen GERD (gastroesophageal reflux disease)   . HTN (hypertension)   . Hx of echocardiogram    Echo (12/14/13):  Mod LVH, EF 50-55%, no RWMA, trivial Eff.  . LV dysfunction    a. 10/2013:  EF 45-50% with followup echo 55-60%.  . Morbid obesity ([place])   . Pericarditis    a. 10/2013: Acute pericarditis with cardiac tamponade s/p pericardiocentesis.  . Sleep  apnea   . Type 2 diabetes mellitus without complication, without long-term current use of insulin ([place]) 01/02/2017    Patient Active Problem List   Diagnosis Date Noted  . Encounter for well adult exam with abnormal findings 07/23/2017  . GERD (gastroesophageal reflux disease)   . Type 2 diabetes mellitus without complication, without long-term current use of insulin ([place]) 01/02/2017  . OSA (obstructive sleep apnea) 05/16/2014  . Cardiac tamponade 11/04/2013  . Morbid obesity ([place]) 11/04/2013  . Essential hypertension 11/04/2013  . Acute pericarditis 11/02/2013    Past Surgical History:  Procedure Laterality Date  . fluid drained from heart    . PERICARDIAL TAP N/A 11/02/2013   Procedure: PERICARDIAL TAP;  Surgeon: Troy Sine, MD;  Location: [facility] CATH LAB;  Service: Cardiovascular;  Laterality: N/A;        Home Medications    Prior to Admission medications   Medication Sig Start Date End Date Taking? Authorizing Provider  atorvastatin (LIPITOR) 10 MG tablet Take 1 tablet (10 mg total) by mouth daily. 07/23/17 07/23/18 Yes Biagio Borg, MD  losartan (COZAAR) 25 MG tablet Take 1 tablet (25 mg total) by mouth daily. 07/23/17  Yes Biagio Borg, MD  metFORMIN (GLUCOPHAGE XR) 500 MG 24 hr  tablet Take 1 tablet (500 mg total) by mouth 2 (two) times daily. 02/18/17  Yes Golden Circle, FNP  Blood Glucose Monitoring Suppl (ONE TOUCH ULTRA MINI) w/Device KIT Use meter to check blood sugars 1-4 times daily as instructed. E11.9 07/23/17   Biagio Borg, MD  glucose blood (ONE TOUCH ULTRA TEST) test strip Use one strip per test. Test blood sugars 1-4 times daily as instructed.  E 11.9 07/23/17   Biagio Borg, MD  Lancets Misc. (ONE TOUCH SURESOFT) MISC Use 1 lancet per test. Test blood sugars 1-4 times per day as instructed. E11.9 07/23/17   Biagio Borg, MD  sildenafil (REVATIO) 20 MG tablet TAKE 1-5 TABLETS DAILY AS NEEDED. DO NOT EXCEED 5 TABLETS IN 1 DAY. Patient not taking: Reported  on 11/21/2017 09/03/17   Biagio Borg, MD    Family History Family History  Problem Relation Age of Onset  . Heart failure Father   . Heart attack Father   . Kidney cancer Father   . Pancreatic cancer Paternal Grandmother   . Hypertension Mother   . Healthy Maternal Grandmother   . Healthy Maternal Grandfather   . Healthy Paternal Grandfather   . CAD Unknown        Aunt with CABG  . Stroke Neg Hx     Social History Social History   Tobacco Use  . Smoking status: Current Some Day Smoker    Packs/day: 0.00    Years: 3.00    Pack years: 0.00    Types: Cigars, Cigarettes  . Smokeless tobacco: Never Used  . Tobacco comment: 1 cigar daily  Substance Use Topics  . Alcohol use: Yes    Comment: occasional  . Drug use: No     Allergies   Patient has no known allergies.   Review of Systems Review of Systems  Constitutional: Negative for chills and fever.  Eyes: Positive for pain, discharge, redness and visual disturbance. Negative for photophobia.     Physical Exam Updated Vital Signs BP (!) 155/116 (BP Location: Right Arm)   Pulse 97   Temp 98.1 F (36.7 C) (Oral)   Resp 16   Ht '5\' 10"'  (1.778 m)   Wt (!) 170.1 kg (375 lb)   SpO2 98%   BMI 53.81 kg/m   Physical Exam  Constitutional: He appears well-developed and well-nourished. No distress.  HENT:  Head: Normocephalic and atraumatic.  Eyes: Pupils are equal, round, and reactive to light. EOM are normal. Right eye exhibits no discharge. Left eye exhibits discharge.  Left eye with injected conjunctiva.  Clear tearful drainage from the eye.  No foreign bodies noted.  No pain with EOMs.  No chemosis, no proptosis, no consensual photophobia.  Peripheral vision is intact.  No swelling or erythema of the eyelids and no tenderness to palpation of the periorbital region.  Unable to obtain visual acuity as the patient is wearing a contact lens in the right eye but not the left.  On fluorescein stain, no dendritic  lesions, foreign bodies, or rust rings.  There is a corneal abrasion to the left eye centrally but no ulceration.  Seidel sign is absent  Neck: Normal range of motion. Neck supple. No JVD present. No tracheal deviation present.  Cardiovascular: Normal rate.  Pulmonary/Chest: Effort normal.  Abdominal: He exhibits no distension.  Musculoskeletal: He exhibits no edema.  Neurological: He is alert.  Skin: Skin is dry. No erythema.  Psychiatric: He has a normal mood and affect.  His behavior is normal.  Nursing note and vitals reviewed.    ED Treatments / Results  Labs (all labs ordered are listed, but only abnormal results are displayed) Labs Reviewed - No data to display  EKG None  Radiology No results found.  Procedures Procedures (including critical care time)  Medications Ordered in ED Medications  Tdap (BOOSTRIX) injection 0.5 mL (has no administration in time range)  fluorescein ophthalmic strip 1 strip (1 strip Left Eye Given 11/21/17 1544)  tetracaine (PONTOCAINE) 0.5 % ophthalmic solution 2 drop (2 drops Left Eye Given 11/21/17 1544)     Initial Impression / Assessment and Plan / ED Course  I have reviewed the triage vital signs and the nursing notes.  Pertinent labs & imaging results that were available during my care of the patient were reviewed by me and considered in my medical decision making (see chart for details).    Pt with corneal abrasion on PE after taking out his contacts yesterday.  He is afebrile, vital signs are at patient's baseline.  Tdap given. Eye irrigated w NS, no evidence of FB.  Unable to assess visual acuity as the patient was wearing a contact lens in the right eye.  Pt is  a contact lens wearer.  Exam non-concerning for orbital cellulitis, hyphema, corneal ulcers. Patient will be discharged home with Cipro ophthalmic drops.   Patient understands to follow up with ophthalmology, & to return to ER if new symptoms develop including change in vision,  purulent drainage, or entrapment.  He has no complaints prior to discharge.  He is hemodynamically stable and safe for discharge home.    Final Clinical Impressions(s) / ED Diagnoses   Final diagnoses:  Abrasion of left cornea, initial encounter    ED Discharge Orders    None       Renita Papa, PA-C 11/21/17 1616    Sherwood Gambler, MD 11/25/17 1456

## 2017-11-21 NOTE — ED Triage Notes (Signed)
Pt reports that last night he was driving and thought his contact came out so when put in a new one had pain. Pt reports since he has taken that contact out. Reports blurred vision and pain when blinking.

## 2017-11-21 NOTE — Discharge Instructions (Signed)
Apply antibiotic ointment drops as prescribed.  Do not wear contact lenses.  Wash hands frequently. Alternate 600 mg of ibuprofen and [id number] mg of Tylenol every 3 hours as needed for pain. Do not exceed 4000 mg of Tylenol daily.  You may apply cool compresses for comfort.  Follow-up with an ophthalmologist in the next 24-48 hours for reevaluation of your symptoms.  Return to the emergency department if any concerning signs or symptoms develop such as fevers, abnormal drainage, worsening vision changes, or pain with eye movements.

## 2017-12-02 ENCOUNTER — Encounter: Payer: Self-pay | Admitting: Internal Medicine

## 2017-12-02 MED ORDER — ATORVASTATIN CALCIUM 10 MG PO TABS: 10.0000 mg | ORAL_TABLET | Freq: Every day | ORAL | 3 refills | Status: DC

## 2017-12-02 MED ORDER — GLIPIZIDE ER 5 MG PO TB24: 5.0000 mg | ORAL_TABLET | Freq: Every day | ORAL | 3 refills | Status: DC

## 2017-12-02 MED ORDER — LOSARTAN POTASSIUM 25 MG PO TABS: 25.0000 mg | ORAL_TABLET | Freq: Every day | ORAL | 3 refills | Status: DC

## 2017-12-02 NOTE — Telephone Encounter (Signed)
Pt having diarrhea from metformin    We should stop the metformin as the diarrhea will be chronic and we normally dont treat a side effect of a medication with another medication.Ok to stop the metformin.We can change to glipizide ER 5 mg daily - I will send a new prescription and you should hear from the office as well.Ok for the other refills as well.Thanks

## 2017-12-02 NOTE — Telephone Encounter (Signed)
See below - shirron to let pt know of med change

## 2017-12-31 ENCOUNTER — Encounter: Payer: Self-pay | Admitting: Internal Medicine

## 2017-12-31 MED ORDER — GLUCOSE BLOOD VI STRP: ORAL_STRIP | 12 refills | Status: DC

## 2017-12-31 MED ORDER — ONETOUCH ULTRA MINI W/DEVICE KIT: PACK | 0 refills | Status: DC

## 2018-01-01 MED ORDER — DULAGLUTIDE 0.75 MG/0.5ML ~~LOC~~ SOAJ: SUBCUTANEOUS | 3 refills | Status: DC

## 2018-01-01 NOTE — Addendum Note (Signed)
Addended by: Corwin Levins on: 01/01/2018 12:32 PM   Modules accepted: Orders

## 2018-01-02 MED ORDER — FREESTYLE LIBRE 14 DAY SENSOR MISC: 1.0000 | 3 refills | Status: DC

## 2018-01-02 MED ORDER — FREESTYLE LIBRE 14 DAY READER DEVI: 1.0000 | Freq: Once | 0 refills | Status: AC

## 2018-01-02 NOTE — Addendum Note (Signed)
Addended by: Corwin Levins on: 01/02/2018 12:16 PM   Modules accepted: Orders

## 2018-01-21 ENCOUNTER — Ambulatory Visit: Payer: No Typology Code available for payment source | Admitting: Internal Medicine

## 2018-01-21 DIAGNOSIS — Z0289 Encounter for other administrative examinations: Secondary | ICD-10-CM

## 2018-04-09 ENCOUNTER — Encounter (HOSPITAL_COMMUNITY): Payer: Self-pay | Admitting: Emergency Medicine

## 2018-04-09 ENCOUNTER — Ambulatory Visit (HOSPITAL_COMMUNITY)
Admission: EM | Admit: 2018-04-09 | Discharge: 2018-04-09 | Disposition: A | Discharge status: Home / Self Care | Payer: Worker's Compensation | Attending: Family Medicine | Admitting: Family Medicine

## 2018-04-09 DIAGNOSIS — M545 Low back pain, unspecified: Secondary | ICD-10-CM

## 2018-04-09 DIAGNOSIS — M25571 Pain in right ankle and joints of right foot: Secondary | ICD-10-CM

## 2018-04-09 DIAGNOSIS — S40011A Contusion of right shoulder, initial encounter: Secondary | ICD-10-CM

## 2018-04-09 MED ORDER — CYCLOBENZAPRINE HCL 5 MG PO TABS: 5.0000 mg | ORAL_TABLET | Freq: Three times a day (TID) | ORAL | 0 refills | Status: DC | PRN

## 2018-04-09 MED ORDER — IBUPROFEN 800 MG PO TABS: 800.0000 mg | ORAL_TABLET | Freq: Three times a day (TID) | ORAL | 0 refills | Status: DC | PRN

## 2018-04-09 NOTE — ED Triage Notes (Signed)
PT reports right shoulder pain, right leg pain, and lower back pain
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
## 2018-04-09 NOTE — Discharge Instructions (Signed)
Ice to painful areas Limit activity while painful Take ibuprofen 3 times a day with food for pain and inflammation Take cyclobenzaprine as needed as muscle relaxer Check in with your company tomorrow regarding Worker's Comp. papers, drug screen, and medical follow-up Limited activity until next week
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
## 2018-10-19 ENCOUNTER — Other Ambulatory Visit: Payer: Self-pay | Admitting: Internal Medicine

## 2018-10-28 ENCOUNTER — Other Ambulatory Visit (INDEPENDENT_AMBULATORY_CARE_PROVIDER_SITE_OTHER): Payer: No Typology Code available for payment source

## 2018-10-28 ENCOUNTER — Ambulatory Visit (INDEPENDENT_AMBULATORY_CARE_PROVIDER_SITE_OTHER): Payer: No Typology Code available for payment source | Admitting: Internal Medicine

## 2018-10-28 ENCOUNTER — Encounter: Payer: Self-pay | Admitting: Internal Medicine

## 2018-10-28 ENCOUNTER — Other Ambulatory Visit: Payer: Self-pay

## 2018-10-28 VITALS — BP 126/84 | HR 91 | Temp 98.1°F | Ht 70.0 in | Wt 387.0 lb

## 2018-10-28 DIAGNOSIS — K219 Gastro-esophageal reflux disease without esophagitis: Secondary | ICD-10-CM | POA: Diagnosis not present

## 2018-10-28 DIAGNOSIS — R351 Nocturia: Secondary | ICD-10-CM | POA: Insufficient documentation

## 2018-10-28 DIAGNOSIS — I10 Essential (primary) hypertension: Secondary | ICD-10-CM

## 2018-10-28 DIAGNOSIS — E119 Type 2 diabetes mellitus without complications: Secondary | ICD-10-CM

## 2018-10-28 DIAGNOSIS — N529 Male erectile dysfunction, unspecified: Secondary | ICD-10-CM | POA: Insufficient documentation

## 2018-10-28 DIAGNOSIS — Z0001 Encounter for general adult medical examination with abnormal findings: Secondary | ICD-10-CM | POA: Diagnosis not present

## 2018-10-28 LAB — URINALYSIS, ROUTINE W REFLEX MICROSCOPIC
Bilirubin Urine: NEGATIVE
Hgb urine dipstick: NEGATIVE
KETONES UR: NEGATIVE
Leukocytes,Ua: NEGATIVE
NITRITE: NEGATIVE
RBC / HPF: NONE SEEN (ref 0–?)
Specific Gravity, Urine: 1.025 (ref 1.000–1.030)
Total Protein, Urine: NEGATIVE
UROBILINOGEN UA: 0.2 (ref 0.0–1.0)
Urine Glucose: >=1000 — AB
pH: 6 (ref 5.0–8.0)

## 2018-10-28 LAB — LIPID PANEL
Cholesterol: 140 mg/dL (ref 0–200)
HDL: 50.1 mg/dL (ref >39.00)
NonHDL: 89.59
Total CHOL/HDL Ratio: 3
Triglycerides: 217 mg/dL — ABNORMAL HIGH (ref 0.0–149.0)
VLDL: 43.4 mg/dL — AB (ref 0.0–40.0)

## 2018-10-28 LAB — HEPATIC FUNCTION PANEL
ALT: 38 U/L (ref 0–53)
AST: 29 U/L (ref 0–37)
Albumin: 4.3 g/dL (ref 3.5–5.2)
Alkaline Phosphatase: 50 U/L (ref 39–117)
Bilirubin, Direct: 0.1 mg/dL (ref 0.0–0.3)
Total Bilirubin: 0.5 mg/dL (ref 0.2–1.2)
Total Protein: 7.2 g/dL (ref 6.0–8.3)

## 2018-10-28 LAB — MICROALBUMIN / CREATININE URINE RATIO
Creatinine,U: 123.7 mg/dL
Microalb Creat Ratio: 0.8 mg/g (ref 0.0–30.0)
Microalb, Ur: 1 mg/dL (ref 0.0–1.9)

## 2018-10-28 LAB — BASIC METABOLIC PANEL
BUN: 9 mg/dL (ref 6–23)
CO2: 27 mEq/L (ref 19–32)
Calcium: 9 mg/dL (ref 8.4–10.5)
Chloride: 99 mEq/L (ref 96–112)
Creatinine, Ser: 0.93 mg/dL (ref 0.40–1.50)
GFR: 111.71 mL/min (ref >60.00)
Glucose, Bld: 385 mg/dL — ABNORMAL HIGH (ref 70–99)
Potassium: 3.6 mEq/L (ref 3.5–5.1)
Sodium: 133 mEq/L — ABNORMAL LOW (ref 135–145)

## 2018-10-28 LAB — CBC WITH DIFFERENTIAL/PLATELET
Basophils Absolute: 0.1 10*3/uL (ref 0.0–0.1)
Basophils Relative: 1.2 % (ref 0.0–3.0)
Eosinophils Absolute: 0.2 10*3/uL (ref 0.0–0.7)
Eosinophils Relative: 3.6 % (ref 0.0–5.0)
HCT: 42.8 % (ref 39.0–52.0)
Hemoglobin: 13.9 g/dL (ref 13.0–17.0)
Lymphocytes Relative: 54.5 % — ABNORMAL HIGH (ref 12.0–46.0)
Lymphs Abs: 3.2 10*3/uL (ref 0.7–4.0)
MCHC: 32.4 g/dL (ref 30.0–36.0)
MCV: 72 fl — ABNORMAL LOW (ref 78.0–100.0)
Monocytes Absolute: 0.5 10*3/uL (ref 0.1–1.0)
Monocytes Relative: 7.8 % (ref 3.0–12.0)
NEUTROS ABS: 1.9 10*3/uL (ref 1.4–7.7)
NEUTROS PCT: 32.9 % — AB (ref 43.0–77.0)
PLATELETS: 204 10*3/uL (ref 150.0–400.0)
RBC: 5.94 Mil/uL — ABNORMAL HIGH (ref 4.22–5.81)
RDW: 13.7 % (ref 11.5–15.5)
WBC: 5.8 10*3/uL (ref 4.0–10.5)

## 2018-10-28 LAB — HEMOGLOBIN A1C: Hgb A1c MFr Bld: 13.8 % — ABNORMAL HIGH (ref 4.6–6.5)

## 2018-10-28 LAB — LDL CHOLESTEROL, DIRECT: Direct LDL: 68 mg/dL

## 2018-10-28 LAB — TSH: TSH: 1.05 u[IU]/mL (ref 0.35–4.50)

## 2018-10-28 MED ORDER — DOXYCYCLINE HYCLATE 100 MG PO TABS: 100.0000 mg | ORAL_TABLET | Freq: Two times a day (BID) | ORAL | 0 refills | Status: AC

## 2018-10-28 MED ORDER — PANTOPRAZOLE SODIUM 40 MG PO TBEC: 40.0000 mg | DELAYED_RELEASE_TABLET | Freq: Every day | ORAL | 3 refills | Status: DC

## 2018-10-28 MED ORDER — SILDENAFIL CITRATE 100 MG PO TABS: 50.0000 mg | ORAL_TABLET | Freq: Every day | ORAL | 11 refills | Status: DC | PRN

## 2018-10-28 NOTE — Assessment & Plan Note (Signed)

## 2018-10-28 NOTE — Assessment & Plan Note (Signed)
stable overall by history and exam, recent data reviewed with pt, and pt to continue medical treatment as before,  to f/u any worsening symptoms or concerns  

## 2018-10-28 NOTE — Assessment & Plan Note (Signed)
stable overall by history and exam, recent data reviewed with pt, and pt to continue medical treatment as before,  to f/u any worsening symptoms or concerns, for a1c with labs 

## 2018-10-28 NOTE — Progress Notes (Signed)
Subjective:    Patient ID: Caleb [name], male    DOB: 12-01-1980, 38 y.o.   MRN: [id number]  HPI  Here for wellness and f/u;  Overall doing ok;  Pt denies Chest pain, worsening SOB, DOE, wheezing, orthopnea, PND, worsening LE edema, palpitations, dizziness or syncope.  Pt denies neurological change such as new headache, facial or extremity weakness.  Pt denies worsening depressive symptoms, suicidal ideation or panic. No fever, night sweats, wt loss, loss of appetite, or other constitutional symptoms.  Pt states good ability with ADL's, has low fall risk, home safety reviewed and adequate, no other significant changes in hearing or vision, and only occasionally active with exercise.  Pt plans to make eye doctor appt.   Also c/o 1-2 wks onset unusual nocturia > 2-3 times per night and daytime urgency, without fever and Denies urinary symptoms such as dysuria, frequency, flank pain, hematuria or n/v, fever, chills.  Has had mild worsening reflux, but no abd pain, dysphagia, n/v, bowel change or blood. Seems to be worse with higher fat diet.   Pt denies polydipsia, polyuria, or low sugar symptoms such as weakness or confusion improved with po intake.  Pt states overall good compliance with meds, trying to follow lower cholesterol, diabetic diet, wt overall stable but little exercise however.   Also with worsening ED symptoms mild intermittent for > 6 mo. Past Medical History:  Diagnosis Date  . Cardiac tamponade 10/2013  . Enlarged pulmonary artery (HCC)    a. Mild enlargement of pulmonary artery by CT 11/01/13.  Marland Kitchen GERD (gastroesophageal reflux disease)   . HTN (hypertension)   . Hx of echocardiogram    Echo (12/14/13):  Mod LVH, EF 50-55%, no RWMA, trivial Eff.  . LV dysfunction    a. 10/2013:  EF 45-50% with followup echo 55-60%.  . Morbid obesity (HCC)   . Pericarditis    a. 10/2013: Acute pericarditis with cardiac tamponade s/p pericardiocentesis.  . Sleep apnea   . Type 2 diabetes mellitus  without complication, without long-term current use of insulin (HCC) 01/02/2017   Past Surgical History:  Procedure Laterality Date  . fluid drained from heart    . PERICARDIAL TAP N/A 11/02/2013   Procedure: PERICARDIAL TAP;  Surgeon: Lennette Bihari, MD;  [facility] CATH LAB;  Service: Cardiovascular;  Laterality: N/A;    reports that he has been smoking cigars and cigarettes. He has been smoking about 0.00 packs per day for the past 3.00 years. He has never used smokeless tobacco. He reports current alcohol use. He reports that he does not use drugs. family history includes CAD in his unknown relative; Healthy in his maternal grandfather, maternal grandmother, and paternal grandfather; Heart attack in his father; Heart failure in his father; Hypertension in his mother; Kidney cancer in his father; Pancreatic cancer in his paternal grandmother. Allergies  Allergen Reactions  . Metformin And Related Diarrhea   Current Outpatient Medications on File Prior to Visit  Medication Sig Dispense Refill  . atorvastatin (LIPITOR) 10 MG tablet TAKE 1 TABLET BY MOUTH EVERY DAY 90 tablet 1  . Continuous Blood Gluc Sensor (FREESTYLE LIBRE 14 DAY SENSOR) MISC Apply 1 Device topically every 14 (fourteen) days. E11.9 6 each 3  . cyclobenzaprine (FLEXERIL) 5 MG tablet Take 1 tablet (5 mg total) by mouth 3 (three) times daily as needed for muscle spasms. 30 tablet 0  . glipiZIDE (GLUCOTROL XL) 5 MG 24 hr tablet Take 1 tablet (5 mg total) by  mouth daily with breakfast. 90 tablet 3  . ibuprofen (ADVIL,MOTRIN) 800 MG tablet Take 1 tablet (800 mg total) by mouth every 8 (eight) hours as needed for moderate pain. 90 tablet 0  . Lancets Misc. (ONE TOUCH SURESOFT) MISC Use 1 lancet per test. Test blood sugars 1-4 times per day as instructed. E11.9 1 each 1  . losartan (COZAAR) 25 MG tablet Take 1 tablet (25 mg total) by mouth daily. 90 tablet 3   No current facility-administered medications on file prior to visit.     Review of Systems Constitutional: Negative for other unusual diaphoresis, sweats, appetite or weight changes HENT: Negative for other worsening hearing loss, ear pain, facial swelling, mouth sores or neck stiffness.   Eyes: Negative for other worsening pain, redness or other visual disturbance.  Respiratory: Negative for other stridor or swelling Cardiovascular: Negative for other palpitations or other chest pain  Gastrointestinal: Negative for worsening diarrhea or loose stools, blood in stool, distention or other pain Genitourinary: Negative for hematuria, flank pain or other change in urine volume.  Musculoskeletal: Negative for myalgias or other joint swelling.  Skin: Negative for other color change, or other wound or worsening drainage.  Neurological: Negative for other syncope or numbness. Hematological: Negative for other adenopathy or swelling Psychiatric/Behavioral: Negative for hallucinations, other worsening agitation, SI, self-injury, or new decreased concentration All other system neg per pt    Objective:   Physical Exam BP 126/84   Pulse 91   Temp 98.1 F (36.7 C) (Oral)   Ht 5\' 10"  (1.778 m)   Wt (!) 387 lb (175.5 kg)   SpO2 96%   BMI 55.53 kg/m  VS noted,  Constitutional: Pt is oriented to person, place, and time. Appears well-developed and well-nourished, in no significant distress and comfortable Head: Normocephalic and atraumatic  Eyes: Conjunctivae and EOM are normal. Pupils are equal, round, and reactive to light Right Ear: External ear normal without discharge Left Ear: External ear normal without discharge Nose: Nose without discharge or deformity Mouth/Throat: Oropharynx is without other ulcerations and moist  Neck: Normal range of motion. Neck supple. No JVD present. No tracheal deviation present or significant neck LA or mass Cardiovascular: Normal rate, regular rhythm, normal heart sounds and intact distal pulses.   Pulmonary/Chest: WOB normal and  breath sounds without rales or wheezing  Abdominal: Soft. Bowel sounds are normal. NT. No HSM  Musculoskeletal: Normal range of motion. Exhibits no edema Lymphadenopathy: Has no other cervical adenopathy.  Neurological: Pt is alert and oriented to person, place, and time. Pt has normal reflexes. No cranial nerve deficit. Motor grossly intact, Gait intact Skin: Skin is warm and dry. No rash noted or new ulcerations Psychiatric:  Has normal mood and affect. Behavior is normal without agitation No other exam findings Lab Results  Component Value Date   WBC 6.9 07/23/2017   HGB 14.2 07/23/2017   HCT 44.8 07/23/2017   PLT 263.0 07/23/2017   GLUCOSE 112 (H) 07/23/2017   CHOL 154 07/23/2017   TRIG 105.0 07/23/2017   HDL 56.00 07/23/2017   LDLCALC 77 07/23/2017   ALT 17 07/23/2017   AST 16 07/23/2017   NA 137 07/23/2017   K 3.7 07/23/2017   CL 101 07/23/2017   CREATININE 1.02 07/23/2017   BUN 13 07/23/2017   CO2 25 07/23/2017   TSH 2.21 07/23/2017   HGBA1C 7.1 (H) 07/23/2017   MICROALBUR 1.3 07/23/2017       Assessment & Plan:

## 2018-10-28 NOTE — Patient Instructions (Signed)
Please take all new medication as prescribed - the antibiotic, protonix for reflux, and viagra as needed  Please continue all other medications as before, and refills have been done if requested.  Please have the pharmacy call with any other refills you may need.  Please continue your efforts at being more active, low cholesterol diet, and weight control.  You are otherwise up to date with prevention measures today.  Please keep your appointments with your specialists as you may have planned  Please go to the LAB in the Basement (turn left off the elevator) for the tests to be done today  You will be contacted by phone if any changes need to be made immediately.  Otherwise, you will receive a letter about your results with an explanation, but please check with MyChart first.  Please remember to sign up for MyChart if you have not done so, as this will be important to you in the future with finding out test results, communicating by private email, and scheduling acute appointments online when needed.  Please return in 6 months, or sooner if needed, with Lab testing done 3-5 days before

## 2018-10-28 NOTE — Assessment & Plan Note (Signed)
With mild worsening symptoms, for protonix asd, anti-reflux diet

## 2018-10-28 NOTE — Assessment & Plan Note (Signed)
Ok for viagra prn,  to f/u any worsening symptoms or concerns  

## 2018-10-28 NOTE — Assessment & Plan Note (Addendum)
Mild to mod, suspect prostatitis, for antibx course, consider urology if not improved,  to f/u any worsening symptoms or concerns  In addition to the time spent performing CPE, I spent an additional 25 minutes face to face,in which greater than 50% of this time was spent in counseling and coordination of care for patient's acute illness as documented, including the differential dx, treatment, further evaluation and other management of nocturia, gerd, htn, DM, ED

## 2018-10-29 ENCOUNTER — Encounter: Payer: Self-pay | Admitting: Internal Medicine

## 2018-10-29 ENCOUNTER — Other Ambulatory Visit: Payer: Self-pay | Admitting: Internal Medicine

## 2018-10-29 DIAGNOSIS — E119 Type 2 diabetes mellitus without complications: Secondary | ICD-10-CM

## 2018-10-29 LAB — URINE CULTURE
MICRO NUMBER:: 332775
Result:: NO GROWTH
SPECIMEN QUALITY:: ADEQUATE

## 2018-10-29 MED ORDER — LINAGLIPTIN 5 MG PO TABS: 5.0000 mg | ORAL_TABLET | Freq: Every day | ORAL | 3 refills | Status: DC

## 2018-10-29 MED ORDER — GLIPIZIDE ER 10 MG PO TB24: 10.0000 mg | ORAL_TABLET | Freq: Every day | ORAL | 3 refills | Status: DC

## 2018-10-29 NOTE — Telephone Encounter (Signed)
I suspect he is referring to tradjenta  Please have pt ask at the pharmacy regarding which medication that is siimilar but also covered  thanks

## 2018-10-30 ENCOUNTER — Telehealth: Payer: Self-pay | Admitting: Internal Medicine

## 2018-10-30 MED ORDER — LINAGLIPTIN 5 MG PO TABS: 5.0000 mg | ORAL_TABLET | Freq: Every day | ORAL | 3 refills | Status: DC

## 2018-10-30 NOTE — Telephone Encounter (Signed)
Pt given results per Dr Oliver Barre; see result note dated 10/30/2018; he would like to have his tradjenta sent to [facility] [address] [place], [place]; will route to office for notification.  Requested Prescriptions  Pending Prescriptions Disp Refills  . linagliptin (TRADJENTA) 5 MG TABS tablet 90 tablet 3    Sig: Take 1 tablet (5 mg total) by mouth daily.     There is no refill protocol information for this order

## 2019-01-03 ENCOUNTER — Other Ambulatory Visit: Payer: Self-pay | Admitting: Internal Medicine

## 2019-01-12 ENCOUNTER — Ambulatory Visit: Payer: No Typology Code available for payment source | Admitting: Registered"

## 2019-01-18 ENCOUNTER — Other Ambulatory Visit: Payer: Self-pay | Admitting: Internal Medicine

## 2019-01-28 ENCOUNTER — Ambulatory Visit: Payer: No Typology Code available for payment source | Admitting: Internal Medicine

## 2019-01-28 NOTE — Progress Notes (Signed)
Patient ID: Caleb [name], male   DOB: [birth year], 38 y.o.   MRN: [id number]  Pt not seen, unable to connect by doxy video or by phone

## 2019-01-28 NOTE — Patient Instructions (Signed)
error 

## 2019-03-28 ENCOUNTER — Encounter: Payer: Self-pay | Admitting: Internal Medicine

## 2019-03-29 MED ORDER — DICYCLOMINE HCL 20 MG PO TABS: 20.0000 mg | ORAL_TABLET | Freq: Four times a day (QID) | ORAL | 1 refills | Status: DC

## 2019-04-14 ENCOUNTER — Encounter: Payer: Self-pay | Admitting: Internal Medicine

## 2019-04-14 MED ORDER — PANTOPRAZOLE SODIUM 40 MG PO TBEC: 40.0000 mg | DELAYED_RELEASE_TABLET | Freq: Two times a day (BID) | ORAL | 3 refills | Status: DC

## 2019-05-03 ENCOUNTER — Other Ambulatory Visit: Payer: Self-pay | Admitting: Internal Medicine

## 2019-06-27 ENCOUNTER — Encounter: Payer: Self-pay | Admitting: Internal Medicine

## 2019-06-28 ENCOUNTER — Encounter: Payer: Self-pay | Admitting: Internal Medicine

## 2019-06-28 NOTE — Telephone Encounter (Signed)
Staff to contact pt - for OV to assess

## 2019-06-29 ENCOUNTER — Encounter (HOSPITAL_COMMUNITY): Payer: Self-pay

## 2019-06-29 ENCOUNTER — Telehealth: Payer: Self-pay | Admitting: *Deleted

## 2019-06-29 ENCOUNTER — Ambulatory Visit (HOSPITAL_COMMUNITY)
Admission: EM | Admit: 2019-06-29 | Discharge: 2019-06-29 | Disposition: A | Discharge status: Home / Self Care | Payer: No Typology Code available for payment source | Attending: Family Medicine | Admitting: Family Medicine

## 2019-06-29 DIAGNOSIS — F17210 Nicotine dependence, cigarettes, uncomplicated: Secondary | ICD-10-CM | POA: Diagnosis not present

## 2019-06-29 DIAGNOSIS — I10 Essential (primary) hypertension: Secondary | ICD-10-CM | POA: Insufficient documentation

## 2019-06-29 DIAGNOSIS — R0602 Shortness of breath: Secondary | ICD-10-CM | POA: Insufficient documentation

## 2019-06-29 DIAGNOSIS — Z7984 Long term (current) use of oral hypoglycemic drugs: Secondary | ICD-10-CM | POA: Diagnosis not present

## 2019-06-29 DIAGNOSIS — R430 Anosmia: Secondary | ICD-10-CM | POA: Insufficient documentation

## 2019-06-29 DIAGNOSIS — B349 Viral infection, unspecified: Secondary | ICD-10-CM | POA: Diagnosis not present

## 2019-06-29 DIAGNOSIS — R079 Chest pain, unspecified: Secondary | ICD-10-CM | POA: Insufficient documentation

## 2019-06-29 DIAGNOSIS — Z79899 Other long term (current) drug therapy: Secondary | ICD-10-CM | POA: Insufficient documentation

## 2019-06-29 DIAGNOSIS — R5383 Other fatigue: Secondary | ICD-10-CM | POA: Diagnosis present

## 2019-06-29 DIAGNOSIS — K219 Gastro-esophageal reflux disease without esophagitis: Secondary | ICD-10-CM | POA: Insufficient documentation

## 2019-06-29 DIAGNOSIS — N529 Male erectile dysfunction, unspecified: Secondary | ICD-10-CM | POA: Diagnosis not present

## 2019-06-29 DIAGNOSIS — E119 Type 2 diabetes mellitus without complications: Secondary | ICD-10-CM | POA: Diagnosis not present

## 2019-06-29 DIAGNOSIS — R351 Nocturia: Secondary | ICD-10-CM | POA: Diagnosis not present

## 2019-06-29 DIAGNOSIS — U071 COVID-19: Secondary | ICD-10-CM | POA: Insufficient documentation

## 2019-06-29 LAB — COMPREHENSIVE METABOLIC PANEL
ALT: 44 U/L (ref 0–44)
AST: 38 U/L (ref 15–41)
Albumin: 4.1 g/dL (ref 3.5–5.0)
Alkaline Phosphatase: 46 U/L (ref 38–126)
Anion gap: 11 (ref 5–15)
BUN: 9 mg/dL (ref 6–20)
CO2: 25 mmol/L (ref 22–32)
Calcium: 9 mg/dL (ref 8.9–10.3)
Chloride: 97 mmol/L — ABNORMAL LOW (ref 98–111)
Creatinine, Ser: 0.99 mg/dL (ref 0.61–1.24)
GFR calc Af Amer: >60 mL/min (ref >60)
GFR calc non Af Amer: >60 mL/min (ref >60)
Glucose, Bld: 263 mg/dL — ABNORMAL HIGH (ref 70–99)
Potassium: 4.1 mmol/L (ref 3.5–5.1)
Sodium: 133 mmol/L — ABNORMAL LOW (ref 135–145)
Total Bilirubin: 1 mg/dL (ref 0.3–1.2)
Total Protein: 7.7 g/dL (ref 6.5–8.1)

## 2019-06-29 LAB — CBC
HCT: 46.6 % (ref 39.0–52.0)
Hemoglobin: 15 g/dL (ref 13.0–17.0)
MCH: 23 pg — ABNORMAL LOW (ref 26.0–34.0)
MCHC: 32.2 g/dL (ref 30.0–36.0)
MCV: 71.6 fL — ABNORMAL LOW (ref 80.0–100.0)
Platelets: 176 10*3/uL (ref 150–400)
RBC: 6.51 MIL/uL — ABNORMAL HIGH (ref 4.22–5.81)
RDW: 14 % (ref 11.5–15.5)
WBC: 4.4 10*3/uL (ref 4.0–10.5)
nRBC: 0 % (ref 0.0–0.2)

## 2019-06-29 MED ORDER — AZITHROMYCIN 250 MG PO TABS: 250.0000 mg | ORAL_TABLET | Freq: Every day | ORAL | 0 refills | Status: DC

## 2019-06-29 NOTE — Discharge Instructions (Signed)
We will inform you of the results from tonight  Please try to stay well hydrated  Please stay home and avoid contact with others until you know your results.  Please follow up if your symptoms seem to be worsening.

## 2019-06-29 NOTE — ED Triage Notes (Signed)
Pt reports he is having generalized body aches x 1 week. Pt states he can not smell or taste x 2 days. Pt states his chest feels tightness when he takes a deep breath. Pt denies chest pain.

## 2019-06-29 NOTE — Telephone Encounter (Signed)
Left message for patient to call back to schedule a in office visit with PCP.

## 2019-06-29 NOTE — ED Provider Notes (Signed)
[facility]    CSN: [id number] Arrival date & time: 06/29/19  1901      History   Chief Complaint Chief Complaint  Patient presents with  . Fatigue  . anosmia  . Generalized Body Aches  . Chest Pain    HPI Maryruth BunMyron T Sciuto is a 38 y.o. male.   He is presenting with malaise and fatigue.  He has lost the sense of smell and taste.  He works in delivery.  His symptoms been ongoing for about 1 week.  He is unsure of his blood sugar.  He is not take anything for his symptoms.  He is unsure if he has been exposed anyone with Covid.  Has some shortness of breath.  HPI  Past Medical History:  Diagnosis Date  . Cardiac tamponade 10/2013  . Enlarged pulmonary artery (HCC)    a. Mild enlargement of pulmonary artery by CT 11/01/13.  Marland Kitchen. GERD (gastroesophageal reflux disease)   . HTN (hypertension)   . Hx of echocardiogram    Echo (12/14/13):  Mod LVH, EF 50-55%, no RWMA, trivial Eff.  . LV dysfunction    a. 10/2013:  EF 45-50% with followup echo 55-60%.  . Morbid obesity (HCC)   . Pericarditis    a. 10/2013: Acute pericarditis with cardiac tamponade s/p pericardiocentesis.  . Sleep apnea   . Type 2 diabetes mellitus without complication, without long-term current use of insulin (HCC) 01/02/2017    Patient Active Problem List   Diagnosis Date Noted  . Nocturia more than twice per night 10/28/2018  . Erectile dysfunction 10/28/2018  . Encounter for well adult exam with abnormal findings 07/23/2017  . GERD (gastroesophageal reflux disease)   . Type 2 diabetes mellitus without complication, without long-term current use of insulin (HCC) 01/02/2017  . OSA (obstructive sleep apnea) 05/16/2014  . Cardiac tamponade 11/04/2013  . Morbid obesity (HCC) 11/04/2013  . Essential hypertension 11/04/2013  . Acute pericarditis 11/02/2013    Past Surgical History:  Procedure Laterality Date  . fluid drained from heart    . PERICARDIAL TAP N/A 11/02/2013   Procedure: PERICARDIAL TAP;   Surgeon: Lennette Biharihomas A Kelly, MD;  [facility];  Service: Cardiovascular;  Laterality: N/A;       Home Medications    Prior to Admission medications   Medication Sig Start Date End Date Taking? Authorizing Provider  atorvastatin (LIPITOR) 10 MG tablet TAKE 1 TABLET BY MOUTH EVERY DAY 10/19/18   Corwin LevinsJohn, James W, MD  azithromycin (ZITHROMAX) 250 MG tablet Take 1 tablet (250 mg total) by mouth daily. Take first 2 tablets together, then 1 every day until finished. 06/29/19   Myra RudeSchmitz, Walsie Smeltz E, MD  Continuous Blood Gluc Sensor (FREESTYLE LIBRE 14 DAY SENSOR) MISC Apply 1 Device topically every 14 (fourteen) days. E11.9 01/02/18   Corwin LevinsJohn, James W, MD  cyclobenzaprine (FLEXERIL) 5 MG tablet Take 1 tablet (5 mg total) by mouth 3 (three) times daily as needed for muscle spasms. 04/09/18   Eustace MooreNelson, Yvonne Sue, MD  dicyclomine (BENTYL) 20 MG tablet TAKE 1 TABLET (20 MG TOTAL) BY MOUTH EVERY 6 (SIX) HOURS. 05/03/19   Corwin LevinsJohn, James W, MD  glipiZIDE (GLUCOTROL XL) 10 MG 24 hr tablet Take 1 tablet (10 mg total) by mouth daily with breakfast. 10/29/18   Corwin LevinsJohn, James W, MD  glipiZIDE (GLUCOTROL XL) 5 MG 24 hr tablet TAKE 1 TABLET BY MOUTH EVERY DAY WITH BREAKFAST 01/05/19   Corwin LevinsJohn, James W, MD  ibuprofen (ADVIL,MOTRIN) 800 MG  tablet Take 1 tablet (800 mg total) by mouth every 8 (eight) hours as needed for moderate pain. 04/09/18   Eustace Moore, MD  Lancets Misc. (ONE TOUCH SURESOFT) MISC Use 1 lancet per test. Test blood sugars 1-4 times per day as instructed. E11.9 07/23/17   Corwin Levins, MD  linagliptin (TRADJENTA) 5 MG TABS tablet Take 1 tablet (5 mg total) by mouth daily. 10/30/18   Corwin Levins, MD  losartan (COZAAR) 25 MG tablet TAKE 1 TABLET BY MOUTH EVERY DAY 01/18/19   Corwin Levins, MD  pantoprazole (PROTONIX) 40 MG tablet Take 1 tablet (40 mg total) by mouth 2 (two) times daily. 04/14/19   Corwin Levins, MD  sildenafil (VIAGRA) 100 MG tablet Take 0.5-1 tablets (50-100 mg total) by mouth daily as needed for  erectile dysfunction. 10/28/18   Corwin Levins, MD    Family History Family History  Problem Relation Age of Onset  . Heart failure Father   . Heart attack Father   . Kidney cancer Father   . Pancreatic cancer Paternal Grandmother   . Hypertension Mother   . Healthy Maternal Grandmother   . Healthy Maternal Grandfather   . Healthy Paternal Grandfather   . CAD Other        Aunt with CABG  . Stroke Neg Hx     Social History Social History   Tobacco Use  . Smoking status: Current Some Day Smoker    Packs/day: 0.00    Years: 3.00    Pack years: 0.00    Types: Cigars, Cigarettes  . Smokeless tobacco: Never Used  . Tobacco comment: 1 cigar daily  Substance Use Topics  . Alcohol use: Yes    Comment: occasional  . Drug use: No     Allergies   Metformin and related   Review of Systems Review of Systems   Physical Exam Triage Vital Signs ED Triage Vitals  Enc Vitals Group     BP 06/29/19 1932 (!) 132/92     Pulse Rate 06/29/19 1932 100     Resp 06/29/19 1932 20     Temp 06/29/19 1932 99.8 F (37.7 C)     Temp Source 06/29/19 1932 Oral     SpO2 06/29/19 1932 95 %     Weight --      Height --      Head Circumference --      Peak Flow --      Pain Score 06/29/19 1928 0     Pain Loc --      Pain Edu? --      Excl. in GC? --    No data found.  Updated Vital Signs BP (!) 132/92 (BP Location: Left Arm)   Pulse 100   Temp 99.8 F (37.7 C) (Oral)   Resp 20   SpO2 95%   Visual Acuity Right Eye Distance:   Left Eye Distance:   Bilateral Distance:    Right Eye Near:   Left Eye Near:    Bilateral Near:     Physical Exam Gen: NAD, alert, cooperative with exam,  ENT: normal lips, normal nasal mucosa, tympanic membranes clear and intact bilaterally, normal oropharynx, no cervical lymphadenopathy Eye: normal EOM, normal conjunctiva and lids CV:  no edema, +2 pedal pulses, regular rhythm, S1-S2   Resp: no accessory muscle use, non-labored, clear to  auscultation bilaterally, no crackles or wheezes  Skin: no rashes, no areas of induration  Neuro: normal tone, normal  sensation to touch Psych:  normal insight, alert and oriented MSK: Normal gait, normal strength    UC Treatments / Results  Labs (all labs ordered are listed, but only abnormal results are displayed) Labs Reviewed  NOVEL CORONAVIRUS, NAA (HOSP ORDER, SEND-OUT TO REF LAB; TAT 18-24 HRS)  CBC  COMPREHENSIVE METABOLIC PANEL    EKG   Radiology No results found.  Procedures Procedures (including critical care time)  Medications Ordered in UC Medications - No data to display  Initial Impression / Assessment and Plan / UC Course  I have reviewed the triage vital signs and the nursing notes.  Pertinent labs & imaging results that were available during my care of the patient were reviewed by me and considered in my medical decision making (see chart for details).     Giavonni is a 38 year old male that is presenting with symptoms that are suggestive of Covid infection.  He also has uncontrolled diabetes and unsure what his current blood sugar is.  He was swabbed for Covid and counseled on supportive care.  He was provided with azithromycin.  Holding off on steroids at this point due to the uncontrolled diabetes.  A CBC and CMP were taken.  Given indications to seek immediate care and follow-up.  A work note was provided.  Final Clinical Impressions(s) / UC Diagnoses   Final diagnoses:  Viral illness     Discharge Instructions     We will inform you of the results from tonight  Please try to stay well hydrated  Please stay home and avoid contact with others until you know your results.  Please follow up if your symptoms seem to be worsening.     ED Prescriptions    Medication Sig Dispense Auth. Provider   azithromycin (ZITHROMAX) 250 MG tablet Take 1 tablet (250 mg total) by mouth daily. Take first 2 tablets together, then 1 every day until finished. 6 tablet  Rosemarie Ax, MD     PDMP not reviewed this encounter.   Rosemarie Ax, MD 06/29/19 2018

## 2019-06-30 ENCOUNTER — Telehealth: Payer: Self-pay | Admitting: Family Medicine

## 2019-06-30 NOTE — Telephone Encounter (Signed)
Informed patient of results.   Rosemarie Ax, MD [facility] 06/30/2019, 4:45 PM

## 2019-07-01 ENCOUNTER — Ambulatory Visit: Payer: No Typology Code available for payment source | Admitting: Internal Medicine

## 2019-07-01 LAB — NOVEL CORONAVIRUS, NAA (HOSP ORDER, SEND-OUT TO REF LAB; TAT 18-24 HRS): SARS-CoV-2, NAA: DETECTED — AB

## 2019-07-02 ENCOUNTER — Telehealth: Payer: Self-pay | Admitting: Emergency Medicine

## 2019-07-02 NOTE — Telephone Encounter (Signed)
Positive covid detected on sample. Attempted to reach patient to discuss, no answer.

## 2019-07-05 ENCOUNTER — Telehealth: Payer: Self-pay | Admitting: Emergency Medicine

## 2019-07-05 NOTE — Telephone Encounter (Signed)
Reached patient about covid results, sent him work note in [place]. All questions answered.

## 2019-11-22 ENCOUNTER — Other Ambulatory Visit: Payer: Self-pay | Admitting: Internal Medicine

## 2019-12-13 ENCOUNTER — Encounter: Payer: Self-pay | Admitting: Internal Medicine

## 2019-12-15 ENCOUNTER — Other Ambulatory Visit: Payer: Self-pay | Admitting: Internal Medicine

## 2019-12-16 ENCOUNTER — Ambulatory Visit (INDEPENDENT_AMBULATORY_CARE_PROVIDER_SITE_OTHER): Payer: No Typology Code available for payment source | Admitting: Internal Medicine

## 2019-12-16 ENCOUNTER — Other Ambulatory Visit: Payer: Self-pay

## 2019-12-16 ENCOUNTER — Encounter: Payer: Self-pay | Admitting: Internal Medicine

## 2019-12-16 VITALS — BP 160/110 | HR 100 | Temp 98.2°F | Ht 70.0 in | Wt 379.0 lb

## 2019-12-16 DIAGNOSIS — E119 Type 2 diabetes mellitus without complications: Secondary | ICD-10-CM

## 2019-12-16 DIAGNOSIS — Z0001 Encounter for general adult medical examination with abnormal findings: Secondary | ICD-10-CM

## 2019-12-16 DIAGNOSIS — R350 Frequency of micturition: Secondary | ICD-10-CM | POA: Diagnosis not present

## 2019-12-16 DIAGNOSIS — E538 Deficiency of other specified B group vitamins: Secondary | ICD-10-CM

## 2019-12-16 DIAGNOSIS — G5602 Carpal tunnel syndrome, left upper limb: Secondary | ICD-10-CM | POA: Insufficient documentation

## 2019-12-16 DIAGNOSIS — N529 Male erectile dysfunction, unspecified: Secondary | ICD-10-CM | POA: Diagnosis not present

## 2019-12-16 DIAGNOSIS — I10 Essential (primary) hypertension: Secondary | ICD-10-CM

## 2019-12-16 DIAGNOSIS — E559 Vitamin D deficiency, unspecified: Secondary | ICD-10-CM

## 2019-12-16 MED ORDER — GLIPIZIDE ER 10 MG PO TB24: 10.0000 mg | ORAL_TABLET | Freq: Every day | ORAL | 3 refills | Status: DC

## 2019-12-16 MED ORDER — CYCLOBENZAPRINE HCL 5 MG PO TABS: 5.0000 mg | ORAL_TABLET | Freq: Three times a day (TID) | ORAL | 2 refills | Status: DC | PRN

## 2019-12-16 MED ORDER — PANTOPRAZOLE SODIUM 40 MG PO TBEC: 40.0000 mg | DELAYED_RELEASE_TABLET | Freq: Two times a day (BID) | ORAL | 3 refills | Status: DC

## 2019-12-16 MED ORDER — AMLODIPINE BESYLATE 5 MG PO TABS: 5.0000 mg | ORAL_TABLET | Freq: Every day | ORAL | 3 refills | Status: DC

## 2019-12-16 MED ORDER — IBUPROFEN 800 MG PO TABS: 800.0000 mg | ORAL_TABLET | Freq: Three times a day (TID) | ORAL | 0 refills | Status: AC | PRN

## 2019-12-16 MED ORDER — LINAGLIPTIN 5 MG PO TABS: 5.0000 mg | ORAL_TABLET | Freq: Every day | ORAL | 3 refills | Status: DC

## 2019-12-16 MED ORDER — ATORVASTATIN CALCIUM 10 MG PO TABS: 10.0000 mg | ORAL_TABLET | Freq: Every day | ORAL | 3 refills | Status: DC

## 2019-12-16 MED ORDER — TADALAFIL 20 MG PO TABS: 10.0000 mg | ORAL_TABLET | ORAL | 11 refills | Status: DC | PRN

## 2019-12-16 NOTE — Patient Instructions (Addendum)
Ok to stop the losartan 25 mg as you have  Please take all new medication as prescribed - amlodipine 5 mg per day for blood pressure  Ok to change the viagra to cialis 20 mg as needed  Please wear a left wrist splint at night only until the numbness is improved  Please continue all other medications as before, and refills have been done if requested.  Please have the pharmacy call with any other refills you may need.  Please continue your efforts at being more active, low cholesterol diet, and weight control.  You are otherwise up to date with prevention measures today.  Please keep your appointments with your specialists as you may have planned  Please go to the LAB at the blood drawing area for the tests to be done - at the [facility] lab next Monday as you mentioned  You will be contacted by phone if any changes need to be made immediately.  Otherwise, you will receive a letter about your results with an explanation, but please check with MyChart first.  Please remember to sign up for MyChart if you have not done so, as this will be important to you in the future with finding out test results, communicating by private email, and scheduling acute appointments online when needed.  Please make an Appointment to return in 3 months, or sooner if needed (the office will call you about an appt to come back)

## 2019-12-16 NOTE — Assessment & Plan Note (Signed)
D/c pt highly unlikely the losartan 25 caused his ED but he is not accepting this, ok for amlodipine 5 qd

## 2019-12-16 NOTE — Progress Notes (Signed)
Subjective:    Patient ID: Caleb [name], male    DOB: [birth year], 39 y.o.   MRN: [id number]  HPI  Here for wellness and f/u;  Overall doing ok;  Pt denies Chest pain, worsening SOB, DOE, wheezing, orthopnea, PND, worsening LE edema, palpitations, dizziness or syncope.  Pt denies neurological change such as new headache, facial or extremity weakness.  Pt denies polydipsia, polyuria, or low sugar symptoms. Pt states overall good compliance with treatment and medications, good tolerability, and has been trying to follow appropriate diet.  Pt denies worsening depressive symptoms, suicidal ideation or panic. No fever, night sweats, wt loss, loss of appetite, or other constitutional symptoms.  Pt states good ability with ADL's, has low fall risk, home safety reviewed and adequate, no other significant changes in hearing or vision, and only occasionally active with exercise. Wt Readings from Last 3 Encounters:  12/16/19 (!) 379 lb (171.9 kg)  10/28/18 (!) 387 lb (175.5 kg)  11/21/17 (!) 375 lb (170.1 kg)  has not f/u a1c nor checked cbgs 'due to covid' and c/o urinary frequency and some wt loss, no fever and Denies urinary symptoms such as dysuria, urgency, flank pain, hematuria or n/v, fever, chills, but is also getting up at night 1-2 times per night.  Also is convinced the losartan 25 mg is causing ED as he stopped at and improved, but then asks also for change of viagra to cialis as well.  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   Past Medical History:  Diagnosis Date  . Cardiac tamponade 10/2013  . Enlarged pulmonary artery ([place])    a. Mild enlargement of pulmonary artery by CT 11/01/13.  Marland Kitchen GERD (gastroesophageal reflux disease)   . HTN (hypertension)   . Hx of echocardiogram    Echo (12/14/13):  Mod LVH, EF 50-55%, no RWMA, trivial  Eff.  . LV dysfunction    a. 10/2013:  EF 45-50% with followup echo 55-60%.  . Morbid obesity ([place])   . Pericarditis    a. 10/2013: Acute pericarditis with cardiac tamponade s/p pericardiocentesis.  . Sleep apnea   . Type 2 diabetes mellitus without complication, without long-term current use of insulin ([place]) 01/02/2017   Past Surgical History:  Procedure Laterality Date  . fluid drained from heart    . PERICARDIAL TAP N/A 11/02/2013   Procedure: PERICARDIAL TAP;  Surgeon: Troy Sine, MD;  Location: [facility];  Service: Cardiovascular;  Laterality: N/A;    reports that he has been smoking cigars and cigarettes. He has been smoking about 0.00 packs per day for the past 3.00 years. He has never used smokeless tobacco. He reports current alcohol use. He reports that he does not use drugs. family history includes CAD in an other family member; Healthy in his maternal grandfather, maternal grandmother, and paternal grandfather; Heart attack in his father; Heart failure in his father; Hypertension in his mother; Kidney cancer in his father; Pancreatic cancer in his paternal grandmother. Allergies  Allergen Reactions  . Metformin And Related Diarrhea   Current Outpatient Medications on File Prior to Visit  Medication Sig Dispense Refill  . Continuous Blood Gluc Sensor (FREESTYLE LIBRE 14 DAY SENSOR) MISC Apply 1 Device topically every 14 (fourteen) days. E11.9 6 each 3  . dicyclomine (BENTYL) 20 MG tablet TAKE 1 TABLET (20  MG TOTAL) BY MOUTH EVERY 6 (SIX) HOURS. 40 tablet 1  . Lancets Misc. (ONE TOUCH SURESOFT) MISC Use 1 lancet per test. Test blood sugars 1-4 times per day as instructed. E11.9 1 each 1   No current facility-administered medications on file prior to visit.   Review of Systems All otherwise neg per pt     Objective:   Physical Exam BP (!) 160/110 (BP Location: Left Arm, Patient Position: Sitting, Cuff Size: Large)   Pulse 100   Temp 98.2 F (36.8 C) (Oral)   Ht 5'  10" (1.778 m)   Wt (!) 379 lb (171.9 kg)   SpO2 97%   BMI 54.38 kg/m  VS noted, supermorbid obese  Constitutional: Pt appears in NAD HENT: Head: NCAT.  Right Ear: External ear normal.  Left Ear: External ear normal.  Eyes: . Pupils are equal, round, and reactive to light. Conjunctivae and EOM are normal Nose: without d/c or deformity Neck: Neck supple. Gross normal ROM Cardiovascular: Normal rate and regular rhythm.   Pulmonary/Chest: Effort normal and breath sounds without rales or wheezing.  Abd:  Soft, NT, ND, + BS, no organomegaly Neurological: Pt is alert. At baseline orientation, motor grossly intact Skin: Skin is warm. No rashes, other new lesions, no LE edema Psychiatric: Pt behavior is normal without agitation  All otherwise neg per pt  Lab Results  Component Value Date   WBC 4.4 06/29/2019   HGB 15.0 06/29/2019   HCT 46.6 06/29/2019   PLT 176 06/29/2019   GLUCOSE 263 (H) 06/29/2019   CHOL 140 10/28/2018   TRIG 217.0 (H) 10/28/2018   HDL 50.10 10/28/2018   LDLDIRECT 68.0 10/28/2018   LDLCALC 77 07/23/2017   ALT 44 06/29/2019   AST 38 06/29/2019   NA 133 (L) 06/29/2019   K 4.1 06/29/2019   CL 97 (L) 06/29/2019   CREATININE 0.99 06/29/2019   BUN 9 06/29/2019   CO2 25 06/29/2019   TSH 1.05 10/28/2018   HGBA1C 13.8 (H) 10/28/2018   MICROALBUR 1.0 10/28/2018      Assessment & Plan:

## 2019-12-16 NOTE — Assessment & Plan Note (Signed)
Likely uncontrolled, for labs as ordered

## 2019-12-16 NOTE — Assessment & Plan Note (Signed)
Mild, for left wrist splint qhs until improved

## 2019-12-16 NOTE — Assessment & Plan Note (Signed)
Ok for change of viagra to cialis prn

## 2019-12-16 NOTE — Assessment & Plan Note (Signed)

## 2019-12-16 NOTE — Assessment & Plan Note (Addendum)
I suspect releated to uncontrolled dm but cant r/o other, for labs as ordered  I spent 41 minutes in addition to time for CPX wellness examination in preparing to see the patient by review of recent labs, imaging and procedures, obtaining and reviewing separately obtained history, communicating with the patient and family or caregiver, ordering medications, tests or procedures, and documenting clinical information in the EHR including the differential Dx, treatment, and any further evaluation and other management of urinary frequency, htN, DM, HTN, left CTS, ED

## 2019-12-20 ENCOUNTER — Other Ambulatory Visit (INDEPENDENT_AMBULATORY_CARE_PROVIDER_SITE_OTHER): Payer: No Typology Code available for payment source

## 2019-12-20 DIAGNOSIS — E559 Vitamin D deficiency, unspecified: Secondary | ICD-10-CM | POA: Diagnosis not present

## 2019-12-20 DIAGNOSIS — Z0001 Encounter for general adult medical examination with abnormal findings: Secondary | ICD-10-CM

## 2019-12-20 DIAGNOSIS — E119 Type 2 diabetes mellitus without complications: Secondary | ICD-10-CM | POA: Diagnosis not present

## 2019-12-20 DIAGNOSIS — E538 Deficiency of other specified B group vitamins: Secondary | ICD-10-CM | POA: Diagnosis not present

## 2019-12-20 DIAGNOSIS — R350 Frequency of micturition: Secondary | ICD-10-CM

## 2019-12-20 LAB — HEPATIC FUNCTION PANEL
ALT: 30 U/L (ref 0–53)
AST: 21 U/L (ref 0–37)
Albumin: 4.4 g/dL (ref 3.5–5.2)
Alkaline Phosphatase: 55 U/L (ref 39–117)
Bilirubin, Direct: 0.1 mg/dL (ref 0.0–0.3)
Total Bilirubin: 0.4 mg/dL (ref 0.2–1.2)
Total Protein: 7.3 g/dL (ref 6.0–8.3)

## 2019-12-20 LAB — BASIC METABOLIC PANEL
BUN: 13 mg/dL (ref 6–23)
CO2: 28 mEq/L (ref 19–32)
Calcium: 9.4 mg/dL (ref 8.4–10.5)
Chloride: 98 mEq/L (ref 96–112)
Creatinine, Ser: 0.98 mg/dL (ref 0.40–1.50)
GFR: 104.48 mL/min (ref >60.00)
Glucose, Bld: 377 mg/dL — ABNORMAL HIGH (ref 70–99)
Potassium: 4.1 mEq/L (ref 3.5–5.1)
Sodium: 133 mEq/L — ABNORMAL LOW (ref 135–145)

## 2019-12-20 LAB — CBC WITH DIFFERENTIAL/PLATELET
Basophils Absolute: 0.1 10*3/uL (ref 0.0–0.1)
Basophils Relative: 1.1 % (ref 0.0–3.0)
Eosinophils Absolute: 0.2 10*3/uL (ref 0.0–0.7)
Eosinophils Relative: 3.5 % (ref 0.0–5.0)
HCT: 41.5 % (ref 39.0–52.0)
Hemoglobin: 13.4 g/dL (ref 13.0–17.0)
Lymphocytes Relative: 53.5 % — ABNORMAL HIGH (ref 12.0–46.0)
Lymphs Abs: 3.8 10*3/uL (ref 0.7–4.0)
MCHC: 32.2 g/dL (ref 30.0–36.0)
MCV: 72.1 fl — ABNORMAL LOW (ref 78.0–100.0)
Monocytes Absolute: 0.5 10*3/uL (ref 0.1–1.0)
Monocytes Relative: 7.1 % (ref 3.0–12.0)
Neutro Abs: 2.5 10*3/uL (ref 1.4–7.7)
Neutrophils Relative %: 34.8 % — ABNORMAL LOW (ref 43.0–77.0)
Platelets: 205 10*3/uL (ref 150.0–400.0)
RBC: 5.76 Mil/uL (ref 4.22–5.81)
RDW: 14.6 % (ref 11.5–15.5)
WBC: 7.1 10*3/uL (ref 4.0–10.5)

## 2019-12-20 LAB — URINALYSIS, ROUTINE W REFLEX MICROSCOPIC
Bilirubin Urine: NEGATIVE
Hgb urine dipstick: NEGATIVE
Ketones, ur: NEGATIVE
Leukocytes,Ua: NEGATIVE
Nitrite: NEGATIVE
RBC / HPF: NONE SEEN (ref 0–?)
Specific Gravity, Urine: 1.025 (ref 1.000–1.030)
Total Protein, Urine: NEGATIVE
Urine Glucose: >=1000 — AB
Urobilinogen, UA: 0.2 (ref 0.0–1.0)
WBC, UA: NONE SEEN (ref 0–?)
pH: 6 (ref 5.0–8.0)

## 2019-12-20 LAB — LDL CHOLESTEROL, DIRECT: Direct LDL: 97 mg/dL

## 2019-12-20 LAB — LIPID PANEL
Cholesterol: 194 mg/dL (ref 0–200)
HDL: 48 mg/dL (ref >39.00)
NonHDL: 145.62
Total CHOL/HDL Ratio: 4
Triglycerides: 366 mg/dL — ABNORMAL HIGH (ref 0.0–149.0)
VLDL: 73.2 mg/dL — ABNORMAL HIGH (ref 0.0–40.0)

## 2019-12-20 LAB — MICROALBUMIN / CREATININE URINE RATIO
Creatinine,U: 83 mg/dL
Microalb Creat Ratio: 2.9 mg/g (ref 0.0–30.0)
Microalb, Ur: 2.4 mg/dL — ABNORMAL HIGH (ref 0.0–1.9)

## 2019-12-20 LAB — HEMOGLOBIN A1C: Hgb A1c MFr Bld: 12.8 % — ABNORMAL HIGH (ref 4.6–6.5)

## 2019-12-20 LAB — PSA: PSA: 0.32 ng/mL (ref 0.10–4.00)

## 2019-12-20 LAB — VITAMIN B12: Vitamin B-12: 864 pg/mL (ref 211–911)

## 2019-12-20 LAB — TSH: TSH: 3.07 u[IU]/mL (ref 0.35–4.50)

## 2019-12-20 LAB — VITAMIN D 25 HYDROXY (VIT D DEFICIENCY, FRACTURES): VITD: 9.84 ng/mL — ABNORMAL LOW (ref 30.00–100.00)

## 2019-12-20 MED ORDER — SITAGLIPTIN PHOSPHATE 100 MG PO TABS: 100.0000 mg | ORAL_TABLET | Freq: Every day | ORAL | 3 refills | Status: DC

## 2019-12-20 NOTE — Telephone Encounter (Signed)
Ok to try change to [place] - done erx  Please ask pt to ask at pharmacy about which similar med to [place] is covivered if it turns out the [place] is not covered

## 2019-12-21 ENCOUNTER — Encounter: Payer: Self-pay | Admitting: Internal Medicine

## 2019-12-21 LAB — URINE CULTURE
MICRO NUMBER:: 10458515
Result:: NO GROWTH
SPECIMEN QUALITY:: ADEQUATE

## 2019-12-22 ENCOUNTER — Other Ambulatory Visit: Payer: Self-pay | Admitting: Internal Medicine

## 2019-12-22 DIAGNOSIS — E0865 Diabetes mellitus due to underlying condition with hyperglycemia: Secondary | ICD-10-CM

## 2019-12-22 MED ORDER — VITAMIN D (ERGOCALCIFEROL) 1.25 MG (50000 UNIT) PO CAPS: 50000.0000 [IU] | ORAL_CAPSULE | ORAL | 0 refills | Status: DC

## 2019-12-28 NOTE — Telephone Encounter (Signed)
Spoke with pt, informed him of labs, new med rx for the Vit D and about the referral that was sent in for an Endocrinologist.

## 2020-01-25 ENCOUNTER — Encounter: Payer: Self-pay | Admitting: Internal Medicine

## 2020-01-31 MED ORDER — ONETOUCH VERIO VI STRP: ORAL_STRIP | 12 refills | Status: DC

## 2020-01-31 MED ORDER — ONETOUCH VERIO W/DEVICE KIT: PACK | 0 refills | Status: DC

## 2020-01-31 MED ORDER — LANCETS MISC: 12 refills | Status: AC

## 2020-01-31 NOTE — Telephone Encounter (Signed)
Done erx 

## 2020-02-11 ENCOUNTER — Ambulatory Visit (INDEPENDENT_AMBULATORY_CARE_PROVIDER_SITE_OTHER): Payer: No Typology Code available for payment source | Admitting: Endocrinology

## 2020-02-11 ENCOUNTER — Other Ambulatory Visit: Payer: Self-pay

## 2020-02-11 ENCOUNTER — Encounter: Payer: Self-pay | Admitting: Endocrinology

## 2020-02-11 VITALS — BP 132/90 | HR 94 | Ht 70.0 in | Wt 375.8 lb

## 2020-02-11 DIAGNOSIS — E119 Type 2 diabetes mellitus without complications: Secondary | ICD-10-CM | POA: Diagnosis not present

## 2020-02-11 LAB — POCT GLYCOSYLATED HEMOGLOBIN (HGB A1C): Hemoglobin A1C: 13.4 % — AB (ref 4.0–5.6)

## 2020-02-11 MED ORDER — INSULIN NPH (HUMAN) (ISOPHANE) 100 UNIT/ML ~~LOC~~ SUSP: 30.0000 [IU] | SUBCUTANEOUS | 11 refills | Status: DC

## 2020-02-11 NOTE — Progress Notes (Signed)
Subjective:    Patient ID: Caleb [name], male    DOB: 1981/04/20, 39 y.o.   MRN: [id number]  HPI pt is referred by Dr Jenny Reichmann, for diabetes.  Pt states DM was dx'ed in 2015; he is unaware of any chronic complications; he has never been on insulin; pt says his diet and exercise are fair; he has never had pancreatitis, pancreatic surgery, severe hypoglycemia or DKA.  He takes glipizide only.  He has frequent urination.  He says cbg's are in the 300's. He cannot afford name brand meds.  Pt says he will pick up new meter, rx'ed by Dr Jenny Reichmann, today.  He works 1st shift, Financial trader.   Past Medical History:  Diagnosis Date  . Cardiac tamponade 10/2013  . 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 Mild enlargement of pulmonary artery by CT 11/01/13.  Marland Kitchen GERD (gastroesophageal reflux disease)   . HTN (hypertension)   . Hx of echocardiogram    Echo (12/14/13):  Mod LVH, EF 50-55%, no RWMA, trivial Eff.  . LV dysfunction    a. 10/2013:  EF 45-50% with followup echo 55-60%.  . Morbid obesity ([place])   . Pericarditis    a. 10/2013: Acute pericarditis with cardiac tamponade s/p pericardiocentesis.  . Sleep apnea   . Type 2 diabetes mellitus without complication, without long-term current use of insulin ([place]) 01/02/2017    Past Surgical History:  Procedure Laterality Date  . fluid drained from heart    . PERICARDIAL TAP N/A 11/02/2013   Procedure: PERICARDIAL TAP;  Surgeon: Troy Sine, MD;  Location: [facility] CATH LAB;  Service: Cardiovascular;  Laterality: N/A;    Social History   Socioeconomic History  . Marital status: Single    Spouse name: Not on file  . Number of children: 2  . Years of education: 34  . Highest education level: Not on file  Occupational History  . Occupation: delivery Education administrator: [employer]  Tobacco Use  . Smoking status: Current Some Day Smoker    Packs/day: 0.00    Years: 3.00    Pack years: 0.00    Types: Cigars, Cigarettes  . Smokeless tobacco:  Never Used  . Tobacco comment: 1 cigar daily  Vaping Use  . Vaping Use: Never used  Substance and Sexual Activity  . Alcohol use: Yes    Comment: occasional  . Drug use: No  . Sexual activity: Not on file  Other Topics Concern  . Not on file  Social History Narrative   Fun: Elbert Ewings out with the kids   Denies religious beliefs effecting health care.    Social Determinants of Health   Financial Resource Strain:   . Difficulty of Paying Living Expenses:   Food Insecurity:   . Worried About Charity fundraiser in the Last Year:   . Arboriculturist in the Last Year:   Transportation Needs:   . Film/video editor (Medical):   Marland Kitchen Lack of Transportation (Non-Medical):   Physical Activity:   . Days of Exercise per Week:   . Minutes of Exercise per Session:   Stress:   . Feeling of Stress :   Social Connections:   . Frequency of Communication with Friends and Family:   . Frequency of Social Gatherings with Friends and Family:   . Attends Religious Services:   . Active Member of Clubs or Organizations:   . Attends Archivist Meetings:   Marland Kitchen Marital Status:  Intimate Partner Violence:   . Fear of Current or Ex-Partner:   . Emotionally Abused:   Marland Kitchen Physically Abused:   . Sexually Abused:     Current Outpatient Medications on File Prior to Visit  Medication Sig Dispense Refill  . amLODipine (NORVASC) 5 MG tablet Take 1 tablet (5 mg total) by mouth daily. 90 tablet 3  . atorvastatin (LIPITOR) 10 MG tablet Take 1 tablet (10 mg total) by mouth daily. 90 tablet 3  . Blood Glucose Monitoring Suppl (ONETOUCH VERIO) w/Device KIT Use as directed once daily E11.9 1 kit 0  . cyclobenzaprine (FLEXERIL) 5 MG tablet Take 1 tablet (5 mg total) by mouth 3 (three) times daily as needed for muscle spasms. 60 tablet 2  . dicyclomine (BENTYL) 20 MG tablet TAKE 1 TABLET (20 MG TOTAL) BY MOUTH EVERY 6 (SIX) HOURS. 40 tablet 1  . glipiZIDE (GLUCOTROL XL) 10 MG 24 hr tablet Take 1 tablet (10 mg  total) by mouth daily with breakfast. 90 tablet 3  . glucose blood (ONETOUCH VERIO) test strip Use as instructed once daily E11.9 100 each 12  . ibuprofen (ADVIL) 800 MG tablet Take 1 tablet (800 mg total) by mouth every 8 (eight) hours as needed for moderate pain. 50 tablet 0  . Lancets MISC Use as directed once daily E11.9 100 each 12  . pantoprazole (PROTONIX) 40 MG tablet Take 1 tablet (40 mg total) by mouth 2 (two) times daily. 180 tablet 3  . tadalafil (CIALIS) 20 MG tablet Take 0.5-1 tablets (10-20 mg total) by mouth every other day as needed for erectile dysfunction. 5 tablet 11  . Vitamin D, Ergocalciferol, (DRISDOL) 1.25 MG (50000 UNIT) CAPS capsule Take 1 capsule (50,000 Units total) by mouth every 7 (seven) days. 12 capsule 0   No current facility-administered medications on file prior to visit.    Allergies  Allergen Reactions  . Metformin And Related Diarrhea    Family History  Problem Relation Age of Onset  . Heart failure Father   . Heart attack Father   . Kidney cancer Father   . Diabetes Father   . Pancreatic cancer Paternal Grandmother   . Hypertension Mother   . Healthy Maternal Grandmother   . Healthy Maternal Grandfather   . Healthy Paternal Grandfather   . CAD Other        Aunt with CABG  . Stroke Neg Hx     BP 132/90   Pulse 94   Ht '5\' 10"'  (1.778 m)   Wt (!) 375 lb 12.8 oz (170.5 kg)   SpO2 96%   BMI 53.92 kg/m    Review of Systems denies weight loss, blurry vision, chest pain, sob, n/v, and depression.      Objective:   Physical Exam VS: see vs page GEN: no distress HEAD: head: no deformity eyes: no periorbital swelling, no proptosis external nose and ears are normal NECK: supple, thyroid is not enlarged CHEST WALL: no deformity LUNGS: clear to auscultation CV: reg rate and rhythm, no murmur MUSCULOSKELETAL: muscle bulk and strength are grossly normal.  no obvious joint swelling.  gait is normal and steady EXTEMITIES: no deformity.  no  ulcer on the feet.  feet are of normal color and temp.  1+ bilat leg edema.  there is bilateral onychomycosis of the toenails PULSES: dorsalis pedis intact bilat.  no carotid bruit NEURO:  cn 2-12 grossly intact.   readily moves all 4's.  sensation is intact to touch on the feet,  but decreased from normal.  SKIN:  Normal texture and temperature.  No rash or suspicious lesion is visible.   NODES:  None palpable at the neck PSYCH: alert, well-oriented.  Does not appear anxious nor depressed.    Lab Results  Component Value Date   HGBA1C 13.4 (A) 02/11/2020    I have reviewed outside records, and summarized: Pt was noted to have severely elevated A1c, and referred here.  He was seen in ER in late 2020, for poss coronavirus.  He was not given steroids, due to poorly-controlled DM.    I personally reviewed electrocardiogram tracing (06/29/17): Indication: sob Impression: NSR.  No MI.  High voltage.  Compared to 2016: no significant change      Assessment & Plan:  Type 2 DM, new to me.  Severe  exacerbation  Patient Instructions  good diet and exercise significantly improve the control of your diabetes.  please let me know if you wish to be referred to a dietician.  high blood sugar is very risky to your health.  you should see an eye doctor and dentist every year.  It is very important to get all recommended vaccinations.  Controlling your blood pressure and cholesterol drastically reduces the damage diabetes does to your body.  Those who smoke should quit.  Please discuss these with your doctor.  check your blood sugar 4 times a day.  vary the time of day when you check, between before the 3 meals, and at bedtime.  also check if you have symptoms of your blood sugar being too high or too low.  please keep a record of the readings and bring it to your next appointment here (or you can bring the meter itself).  You can write it on any piece of paper.  please call us sooner if your blood sugar  goes below 70, or if you have a lot of readings over 200. I have sent a prescription to [facility], for the insulin.  Please see a diabetes education specialist, to go over the injections. Please come back for a follow-up appointment in 2-3 weeks.

## 2020-02-11 NOTE — Patient Instructions (Addendum)
good diet and exercise significantly improve the control of your diabetes.  please let me know if you wish to be referred to a dietician.  high blood sugar is very risky to your health.  you should see an eye doctor and dentist every year.  It is very important to get all recommended vaccinations.  Controlling your blood pressure and cholesterol drastically reduces the damage diabetes does to your body.  Those who smoke should quit.  Please discuss these with your doctor.  check your blood sugar 4 times a day.  vary the time of day when you check, between before the 3 meals, and at bedtime.  also check if you have symptoms of your blood sugar being too high or too low.  please keep a record of the readings and bring it to your next appointment here (or you can bring the meter itself).  You can write it on any piece of paper.  please call us sooner if your blood sugar goes below 70, or if you have a lot of readings over 200. I have sent a prescription to [facility], for the insulin.  Please see a diabetes education specialist, to go over the injections. Please come back for a follow-up appointment in 2-3 weeks.

## 2020-03-07 ENCOUNTER — Encounter: Payer: No Typology Code available for payment source | Attending: Endocrinology | Admitting: Nutrition

## 2020-03-07 ENCOUNTER — Ambulatory Visit (INDEPENDENT_AMBULATORY_CARE_PROVIDER_SITE_OTHER): Payer: No Typology Code available for payment source | Admitting: Endocrinology

## 2020-03-07 ENCOUNTER — Other Ambulatory Visit: Payer: Self-pay

## 2020-03-07 ENCOUNTER — Encounter: Payer: Self-pay | Admitting: Endocrinology

## 2020-03-07 VITALS — BP 148/88 | HR 78 | Ht 70.0 in | Wt 381.2 lb

## 2020-03-07 DIAGNOSIS — Z794 Long term (current) use of insulin: Secondary | ICD-10-CM

## 2020-03-07 DIAGNOSIS — I10 Essential (primary) hypertension: Secondary | ICD-10-CM

## 2020-03-07 DIAGNOSIS — E119 Type 2 diabetes mellitus without complications: Secondary | ICD-10-CM | POA: Diagnosis present

## 2020-03-07 MED ORDER — INSULIN NPH (HUMAN) (ISOPHANE) 100 UNIT/ML ~~LOC~~ SUSP: 50.0000 [IU] | SUBCUTANEOUS | 11 refills | Status: DC

## 2020-03-07 NOTE — Progress Notes (Signed)
Patient was identified by name and DOB.  He has started his insulin X2 weeks and says he is having no difficulty with taking this every morning.  I watched him draw up his insulin (Novoloin N) and he is doing 0.3cc (30u) q AM. We discussed site selection, and the need for site rotation.  We also discussed the timing of the insulin, and the need to eat lunch every day.  He says he does not eat breakfast,and we discussed the possibility of low blood sugars if his lunch is delayed.  We reviewed symptoms, and treatment of low blood sugar, and he was given a sample of glucose tablets to carry with him at all times.  He had no final questions about this.   He did not buy the meter at the pharmacy, due to cost.  We did not have a one touch meter, so he was given a Accu Chek guide and shown how to use this.  His FBS today was 223.  We discussed the goal of less than 130 before meals.  Meter/strip lot number: [id number], exp. 05/03/21.   We also discussed the importance of exercise and meal portion control.  He has not seen Vernona Rieger as yet, so we discussed the importance of having protein, carbs and small amount of fat at each meal,and eliminating sweet drinks, fruit juices and cold cereal and milk.  He reported good understanding of this with no final questions.

## 2020-03-07 NOTE — Patient Instructions (Addendum)
Take 30 u of Novolog every morning.  Rotate injection sites as noted Test blood sugar before one meal each day. Call Dr. Everardo All if blood sugars drop below 80.   See dietitian for quantities of foods to eat. Stop eating cold cereal and milk, sweet drinks, and fruit juices

## 2020-03-07 NOTE — Patient Instructions (Addendum)
Your blood pressure is high today.  Please see your primary care provider soon, to have it rechecked. check your blood sugar 4 times a day.  vary the time of day when you check, between before the 3 meals, and at bedtime.  also check if you have symptoms of your blood sugar being too high or too low.  please keep a record of the readings and bring it to your next appointment here (or you can bring the meter itself).  You can write it on any piece of paper.  please call us sooner if your blood sugar goes below 70, or if you have a lot of readings over 200. Please increase the insulin to 50 units each morning.   When the blood sugar is improved, we'll stop the glipizide.   Please come back for a follow-up appointment in 1 month. When your sugar is better, we can put a continuous glucose monitor on you, and read it here 2 weeks later.

## 2020-03-07 NOTE — Progress Notes (Signed)
Subjective:    Patient ID: Caleb [name], male    DOB: 1980-12-14, 39 y.o.   MRN: [id number]  HPI Pt returns for f/u of diabetes mellitus: DM type: Insulin-requiring type 2 Dx'ed: 3419 Complications: none Therapy: insulin since 2021, and glipizide. DKA: never Severe hypoglycemia: never Pancreatitis: never Pancreatic imaging: never SDOH: therapy limited by pt's request for least expensive meds; he works 1st shift, delivering pharmaceuticals. Other: he declines multiple daily injections Interval history: Pt says he takes insulin as rx'ed.  no cbg record, but states cbg's vary from 200-275.  pt states he feels well in general.   Past Medical History:  Diagnosis Date  . Cardiac tamponade 10/2013  . 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 Mild enlargement of pulmonary artery by CT 11/01/13.  Marland Kitchen GERD (gastroesophageal reflux disease)   . HTN (hypertension)   . Hx of echocardiogram    Echo (12/14/13):  Mod LVH, EF 50-55%, no RWMA, trivial Eff.  . LV dysfunction    a. 10/2013:  EF 45-50% with followup echo 55-60%.  . Morbid obesity ([place])   . Pericarditis    a. 10/2013: Acute pericarditis with cardiac tamponade s/p pericardiocentesis.  . Sleep apnea   . Type 2 diabetes mellitus without complication, without long-term current use of insulin () 01/02/2017    Past Surgical History:  Procedure Laterality Date  . fluid drained from heart    . PERICARDIAL TAP N/A 11/02/2013   Procedure: PERICARDIAL TAP;  Surgeon: Troy Sine, MD;  Location: [facility] CATH LAB;  Service: Cardiovascular;  Laterality: N/A;    Social History   Socioeconomic History  . Marital status: Single    Spouse name: Not on file  . Number of children: 2  . Years of education: 5  . Highest education level: Not on file  Occupational History  . Occupation: delivery Education administrator: [employer]  Tobacco Use  . Smoking status: Current Some Day Smoker    Packs/day: 0.00    Years: 3.00    Pack years: 0.00     Types: Cigars, Cigarettes  . Smokeless tobacco: Never Used  . Tobacco comment: 1 cigar daily  Vaping Use  . Vaping Use: Never used  Substance and Sexual Activity  . Alcohol use: Yes    Comment: occasional  . Drug use: No  . Sexual activity: Not on file  Other Topics Concern  . Not on file  Social History Narrative   Fun: Elbert Ewings out with the kids   Denies religious beliefs effecting health care.    Social Determinants of Health   Financial Resource Strain:   . Difficulty of Paying Living Expenses:   Food Insecurity:   . Worried About Charity fundraiser in the Last Year:   . Arboriculturist in the Last Year:   Transportation Needs:   . Film/video editor (Medical):   Marland Kitchen Lack of Transportation (Non-Medical):   Physical Activity:   . Days of Exercise per Week:   . Minutes of Exercise per Session:   Stress:   . Feeling of Stress :   Social Connections:   . Frequency of Communication with Friends and Family:   . Frequency of Social Gatherings with Friends and Family:   . Attends Religious Services:   . Active Member of Clubs or Organizations:   . Attends Archivist Meetings:   Marland Kitchen Marital Status:   Intimate Partner Violence:   . Fear of Current or  Ex-Partner:   . Emotionally Abused:   Marland Kitchen Physically Abused:   . Sexually Abused:     Current Outpatient Medications on File Prior to Visit  Medication Sig Dispense Refill  . amLODipine (NORVASC) 5 MG tablet Take 1 tablet (5 mg total) by mouth daily. 90 tablet 3  . atorvastatin (LIPITOR) 10 MG tablet Take 1 tablet (10 mg total) by mouth daily. 90 tablet 3  . Blood Glucose Monitoring Suppl (ONETOUCH VERIO) w/Device KIT Use as directed once daily E11.9 1 kit 0  . cyclobenzaprine (FLEXERIL) 5 MG tablet Take 1 tablet (5 mg total) by mouth 3 (three) times daily as needed for muscle spasms. 60 tablet 2  . dicyclomine (BENTYL) 20 MG tablet TAKE 1 TABLET (20 MG TOTAL) BY MOUTH EVERY 6 (SIX) HOURS. 40 tablet 1  . glipiZIDE  (GLUCOTROL XL) 10 MG 24 hr tablet Take 1 tablet (10 mg total) by mouth daily with breakfast. 90 tablet 3  . glucose blood (ONETOUCH VERIO) test strip Use as instructed once daily E11.9 100 each 12  . ibuprofen (ADVIL) 800 MG tablet Take 1 tablet (800 mg total) by mouth every 8 (eight) hours as needed for moderate pain. 50 tablet 0  . Lancets MISC Use as directed once daily E11.9 100 each 12  . pantoprazole (PROTONIX) 40 MG tablet Take 1 tablet (40 mg total) by mouth 2 (two) times daily. 180 tablet 3  . tadalafil (CIALIS) 20 MG tablet Take 0.5-1 tablets (10-20 mg total) by mouth every other day as needed for erectile dysfunction. 5 tablet 11  . Vitamin D, Ergocalciferol, (DRISDOL) 1.25 MG (50000 UNIT) CAPS capsule Take 1 capsule (50,000 Units total) by mouth every 7 (seven) days. 12 capsule 0   No current facility-administered medications on file prior to visit.    Allergies  Allergen Reactions  . Metformin And Related Diarrhea    Family History  Problem Relation Age of Onset  . Heart failure Father   . Heart attack Father   . Kidney cancer Father   . Diabetes Father   . Pancreatic cancer Paternal Grandmother   . Hypertension Mother   . Healthy Maternal Grandmother   . Healthy Maternal Grandfather   . Healthy Paternal Grandfather   . CAD Other        Aunt with CABG  . Stroke Neg Hx     BP (!) 148/88   Pulse 78   Ht '5\' 10"'  (1.778 m)   Wt (!) 381 lb 3.2 oz (172.9 kg)   SpO2 92%   BMI 54.70 kg/m    Review of Systems He denies hypoglycemia    Objective:   Physical Exam VITAL SIGNS:  See vs page GENERAL: no distress        Assessment & Plan:  HTN: is noted today.  Insulin-requiring type 2 DM.  He needs increased rx.   Patient Instructions  Your blood pressure is high today.  Please see your primary care provider soon, to have it rechecked. check your blood sugar 4 times a day.  vary the time of day when you check, between before the 3 meals, and at bedtime.  also  check if you have symptoms of your blood sugar being too high or too low.  please keep a record of the readings and bring it to your next appointment here (or you can bring the meter itself).  You can write it on any piece of paper.  please call us sooner if your blood sugar goes  below 70, or if you have a lot of readings over 200. Please increase the insulin to 50 units each morning.   When the blood sugar is improved, we'll stop the glipizide.   Please come back for a follow-up appointment in 1 month. When your sugar is better, we can put a continuous glucose monitor on you, and read it here 2 weeks later.

## 2020-03-18 ENCOUNTER — Other Ambulatory Visit: Payer: Self-pay | Admitting: Internal Medicine

## 2020-03-20 NOTE — Telephone Encounter (Signed)
LVM to inform pt that "plan to change to OTC Vitamin D3 at 2000 units per day, indefinitely"; pt instructed to call back if any questions/concerns.

## 2020-03-21 ENCOUNTER — Other Ambulatory Visit: Payer: Self-pay

## 2020-03-21 ENCOUNTER — Ambulatory Visit: Payer: No Typology Code available for payment source | Admitting: Internal Medicine

## 2020-03-21 ENCOUNTER — Ambulatory Visit (INDEPENDENT_AMBULATORY_CARE_PROVIDER_SITE_OTHER): Payer: No Typology Code available for payment source | Admitting: Internal Medicine

## 2020-03-21 ENCOUNTER — Encounter: Payer: Self-pay | Admitting: Internal Medicine

## 2020-03-21 VITALS — BP 130/90 | HR 100 | Temp 98.3°F | Ht 70.0 in | Wt 387.0 lb

## 2020-03-21 DIAGNOSIS — K219 Gastro-esophageal reflux disease without esophagitis: Secondary | ICD-10-CM | POA: Diagnosis not present

## 2020-03-21 DIAGNOSIS — E119 Type 2 diabetes mellitus without complications: Secondary | ICD-10-CM | POA: Diagnosis not present

## 2020-03-21 DIAGNOSIS — I10 Essential (primary) hypertension: Secondary | ICD-10-CM | POA: Diagnosis not present

## 2020-03-21 NOTE — Progress Notes (Signed)
Subjective:    Patient ID: Caleb [name], male    DOB: 27-Jan-1981, 39 y.o.   MRN: [id number]  HPI  Here to f/u; overall doing ok,  Pt denies chest pain, increasing sob or doe, wheezing, orthopnea, PND, increased LE swelling, palpitations, dizziness or syncope.  Pt denies new neurological symptoms such as new headache, or facial or extremity weakness or numbness.  Pt denies polydipsia, polyuria, or low sugar episode.  Pt states overall good compliance with meds, mostly trying to follow appropriate diet, with wt overall stable,  but little exercise however. Denies worsening reflux, abd pain, dysphagia, n/v, bowel change or blood. Wt Readings from Last 3 Encounters:  03/21/20 (!) 387 lb (175.5 kg)  03/07/20 (!) 381 lb 3.2 oz (172.9 kg)  02/11/20 (!) 375 lb 12.8 oz (170.5 kg)  Has seen endo with improved cbgs, and DM education.   Past Medical History:  Diagnosis Date  . Cardiac tamponade 10/2013  . 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 Mild enlargement of pulmonary artery by CT 11/01/13.  Marland Kitchen GERD (gastroesophageal reflux disease)   . HTN (hypertension)   . Hx of echocardiogram    Echo (12/14/13):  Mod LVH, EF 50-55%, no RWMA, trivial Eff.  . LV dysfunction    a. 10/2013:  EF 45-50% with followup echo 55-60%.  . Morbid obesity ([place])   . Pericarditis    a. 10/2013: Acute pericarditis with cardiac tamponade s/p pericardiocentesis.  . Sleep apnea   . Type 2 diabetes mellitus without complication, without long-term current use of insulin ([place]) 01/02/2017   Past Surgical History:  Procedure Laterality Date  . fluid drained from heart    . PERICARDIAL TAP N/A 11/02/2013   Procedure: PERICARDIAL TAP;  Surgeon: Troy Sine, MD;  Location: [facility] CATH LAB;  Service: Cardiovascular;  Laterality: N/A;    reports that he has been smoking cigars and cigarettes. He has been smoking about 0.00 packs per day for the past 3.00 years. He has never used smokeless tobacco. He reports current alcohol use. He  reports that he does not use drugs. family history includes CAD in an other family member; Diabetes in his father; Healthy in his maternal grandfather, maternal grandmother, and paternal grandfather; Heart attack in his father; Heart failure in his father; Hypertension in his mother; Kidney cancer in his father; Pancreatic cancer in his paternal grandmother. Allergies  Allergen Reactions  . Metformin And Related Diarrhea   Current Outpatient Medications on File Prior to Visit  Medication Sig Dispense Refill  . amLODipine (NORVASC) 5 MG tablet Take 1 tablet (5 mg total) by mouth daily. 90 tablet 3  . atorvastatin (LIPITOR) 10 MG tablet Take 1 tablet (10 mg total) by mouth daily. 90 tablet 3  . Blood Glucose Monitoring Suppl (ONETOUCH VERIO) w/Device KIT Use as directed once daily E11.9 1 kit 0  . cyclobenzaprine (FLEXERIL) 5 MG tablet Take 1 tablet (5 mg total) by mouth 3 (three) times daily as needed for muscle spasms. 60 tablet 2  . dicyclomine (BENTYL) 20 MG tablet TAKE 1 TABLET (20 MG TOTAL) BY MOUTH EVERY 6 (SIX) HOURS. 40 tablet 1  . glipiZIDE (GLUCOTROL XL) 10 MG 24 hr tablet Take 1 tablet (10 mg total) by mouth daily with breakfast. 90 tablet 3  . ibuprofen (ADVIL) 800 MG tablet Take 1 tablet (800 mg total) by mouth every 8 (eight) hours as needed for moderate pain. 50 tablet 0  . insulin NPH Human (NOVOLIN N) 100  UNIT/ML injection Inject 0.5 mLs (50 Units total) into the skin every morning. And syringes 1/day 20 mL 11  . Lancets MISC Use as directed once daily E11.9 100 each 12  . pantoprazole (PROTONIX) 40 MG tablet Take 1 tablet (40 mg total) by mouth 2 (two) times daily. 180 tablet 3  . tadalafil (CIALIS) 20 MG tablet Take 0.5-1 tablets (10-20 mg total) by mouth every other day as needed for erectile dysfunction. 5 tablet 11   No current facility-administered medications on file prior to visit.   Review of Systems All otherwise neg per pt     Objective:   Physical Exam BP  130/90 (BP Location: Left Arm, Patient Position: Sitting, Cuff Size: Large)   Pulse 100   Temp 98.3 F (36.8 C) (Oral)   Ht '5\' 10"'  (1.778 m)   Wt (!) 387 lb (175.5 kg)   SpO2 96%   BMI 55.53 kg/m  VS noted,  Constitutional: Pt appears in NAD HENT: Head: NCAT.  Right Ear: External ear normal.  Left Ear: External ear normal.  Eyes: . Pupils are equal, round, and reactive to light. Conjunctivae and EOM are normal Nose: without d/c or deformity Neck: Neck supple. Gross normal ROM Cardiovascular: Normal rate and regular rhythm.   Pulmonary/Chest: Effort normal and breath sounds without rales or wheezing.  Abd:  Soft, NT, ND, + BS, no organomegaly Neurological: Pt is alert. At baseline orientation, motor grossly intact Skin: Skin is warm. No rashes, other new lesions, no LE edema Psychiatric: Pt behavior is normal without agitation  All otherwise neg per pt Lab Results  Component Value Date   WBC 7.1 12/20/2019   HGB 13.4 12/20/2019   HCT 41.5 12/20/2019   PLT 205.0 12/20/2019   GLUCOSE 377 (H) 12/20/2019   CHOL 194 12/20/2019   TRIG 366.0 (H) 12/20/2019   HDL 48.00 12/20/2019   LDLDIRECT 97.0 12/20/2019   LDLCALC 77 07/23/2017   ALT 30 12/20/2019   AST 21 12/20/2019   NA 133 (L) 12/20/2019   K 4.1 12/20/2019   CL 98 12/20/2019   CREATININE 0.98 12/20/2019   BUN 13 12/20/2019   CO2 28 12/20/2019   TSH 3.07 12/20/2019   PSA 0.32 12/20/2019   HGBA1C 13.4 (A) 02/11/2020   MICROALBUR 2.4 (H) 12/20/2019        Assessment & Plan:

## 2020-03-23 ENCOUNTER — Encounter: Payer: Self-pay | Admitting: Internal Medicine

## 2020-03-23 MED ORDER — ACCU-CHEK GUIDE VI STRP: ORAL_STRIP | 3 refills | Status: DC

## 2020-03-25 ENCOUNTER — Encounter: Payer: Self-pay | Admitting: Internal Medicine

## 2020-03-25 NOTE — Patient Instructions (Signed)
Please continue all other medications as before, and refills have been done if requested.  Please have the pharmacy call with any other refills you may need.  Please continue your efforts at being more active, low cholesterol diet, and weight control.  You are otherwise up to date with prevention measures today.  Please keep your appointments with your specialists as you may have planned  Please make an Appointment to return in 6 months 

## 2020-03-25 NOTE — Assessment & Plan Note (Addendum)

## 2020-03-25 NOTE — Assessment & Plan Note (Signed)
stable overall by history and exam, recent data reviewed with pt, and pt to continue medical treatment as before,  to f/u any worsening symptoms or concerns  

## 2020-03-28 ENCOUNTER — Other Ambulatory Visit: Payer: Self-pay | Admitting: Internal Medicine

## 2020-03-29 NOTE — Telephone Encounter (Signed)
Please refill as per office routine med refill policy (all routine meds refilled for 3 mo or monthly per pt preference up to one year from last visit, then month to month grace period for 3 mo, then further med refills will have to be denied)  

## 2020-04-11 ENCOUNTER — Ambulatory Visit: Payer: No Typology Code available for payment source | Admitting: Endocrinology

## 2020-05-16 ENCOUNTER — Encounter: Payer: Self-pay | Admitting: Internal Medicine

## 2020-07-05 ENCOUNTER — Emergency Department (HOSPITAL_COMMUNITY): Payer: No Typology Code available for payment source

## 2020-07-05 ENCOUNTER — Inpatient Hospital Stay (HOSPITAL_COMMUNITY)
Admission: EM | Admit: 2020-07-05 | Discharge: 2020-07-07 | DRG: 872 | Disposition: A | Discharge status: Home / Self Care | Payer: Self-pay | Attending: Internal Medicine | Admitting: Internal Medicine

## 2020-07-05 ENCOUNTER — Encounter (HOSPITAL_COMMUNITY): Payer: Self-pay | Admitting: Emergency Medicine

## 2020-07-05 DIAGNOSIS — G4733 Obstructive sleep apnea (adult) (pediatric): Secondary | ICD-10-CM | POA: Diagnosis present

## 2020-07-05 DIAGNOSIS — Z794 Long term (current) use of insulin: Secondary | ICD-10-CM

## 2020-07-05 DIAGNOSIS — K219 Gastro-esophageal reflux disease without esophagitis: Secondary | ICD-10-CM | POA: Diagnosis present

## 2020-07-05 DIAGNOSIS — Z6841 Body Mass Index (BMI) 40.0 and over, adult: Secondary | ICD-10-CM

## 2020-07-05 DIAGNOSIS — N492 Inflammatory disorders of scrotum: Secondary | ICD-10-CM | POA: Diagnosis present

## 2020-07-05 DIAGNOSIS — E785 Hyperlipidemia, unspecified: Secondary | ICD-10-CM | POA: Diagnosis present

## 2020-07-05 DIAGNOSIS — Z800 Family history of malignant neoplasm of digestive organs: Secondary | ICD-10-CM

## 2020-07-05 DIAGNOSIS — I10 Essential (primary) hypertension: Secondary | ICD-10-CM | POA: Diagnosis present

## 2020-07-05 DIAGNOSIS — F17290 Nicotine dependence, other tobacco product, uncomplicated: Secondary | ICD-10-CM | POA: Diagnosis present

## 2020-07-05 DIAGNOSIS — Z888 Allergy status to other drugs, medicaments and biological substances status: Secondary | ICD-10-CM

## 2020-07-05 DIAGNOSIS — Z79899 Other long term (current) drug therapy: Secondary | ICD-10-CM

## 2020-07-05 DIAGNOSIS — Z833 Family history of diabetes mellitus: Secondary | ICD-10-CM

## 2020-07-05 DIAGNOSIS — Z8249 Family history of ischemic heart disease and other diseases of the circulatory system: Secondary | ICD-10-CM

## 2020-07-05 DIAGNOSIS — Z20822 Contact with and (suspected) exposure to covid-19: Secondary | ICD-10-CM | POA: Diagnosis present

## 2020-07-05 DIAGNOSIS — E119 Type 2 diabetes mellitus without complications: Secondary | ICD-10-CM

## 2020-07-05 DIAGNOSIS — Z8051 Family history of malignant neoplasm of kidney: Secondary | ICD-10-CM

## 2020-07-05 DIAGNOSIS — A419 Sepsis, unspecified organism: Principal | ICD-10-CM

## 2020-07-05 MED ORDER — HYDROMORPHONE HCL 1 MG/ML IJ SOLN
1.0000 mg | Freq: Once | INTRAMUSCULAR | Status: AC
  Administered 2020-07-06: 1 mg via INTRAVENOUS
  Filled 2020-07-05: qty 1

## 2020-07-05 MED ORDER — LACTATED RINGERS IV BOLUS
1000.0000 mL | Freq: Once | INTRAVENOUS | Status: AC
  Administered 2020-07-06: 1000 mL via INTRAVENOUS

## 2020-07-05 MED ORDER — ONDANSETRON HCL 4 MG/2ML IJ SOLN
4.0000 mg | Freq: Once | INTRAMUSCULAR | Status: AC
  Administered 2020-07-06: 4 mg via INTRAVENOUS
  Filled 2020-07-05: qty 2

## 2020-07-05 NOTE — ED Provider Notes (Signed)
[facility] DEPT Provider Note   CSN: [id number] Arrival date & time: 07/05/20  2257     History No chief complaint on file.   Caleb [name] is a 39 y.o. male.  Patient presents to the emergency department for evaluation of scrotal abscess.  Patient reports that symptoms began 3 or 4 days ago.  Initially was a small pinpoint area that has expanded.  He reports that the area did start to drain, but the surrounding area has not become significantly swollen and painful.  He has not taken his temperature but has had some chills.        Past Medical History:  Diagnosis Date  . Cardiac tamponade 10/2013  . 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 Mild enlargement of pulmonary artery by CT 11/01/13.  Marland Kitchen GERD (gastroesophageal reflux disease)   . HTN (hypertension)   . Hx of echocardiogram    Echo (12/14/13):  Mod LVH, EF 50-55%, no RWMA, trivial Eff.  . LV dysfunction    a. 10/2013:  EF 45-50% with followup echo 55-60%.  . Morbid obesity ([place])   . Pericarditis    a. 10/2013: Acute pericarditis with cardiac tamponade s/p pericardiocentesis.  . Sleep apnea   . Type 2 diabetes mellitus without complication, without long-term current use of insulin ([place]) 01/02/2017    Patient Active Problem List   Diagnosis Date Noted  . Urinary frequency 12/16/2019  . Left carpal tunnel syndrome 12/16/2019  . Nocturia more than twice per night 10/28/2018  . Erectile dysfunction 10/28/2018  . Encounter for well adult exam with abnormal findings 07/23/2017  . GERD (gastroesophageal reflux disease)   . Type 2 diabetes mellitus without complication, without long-term current use of insulin ([place]) 01/02/2017  . OSA (obstructive sleep apnea) 05/16/2014  . Cardiac tamponade 11/04/2013  . Morbid obesity ([place]) 11/04/2013  . Essential hypertension 11/04/2013  . Acute pericarditis 11/02/2013    Past Surgical History:  Procedure Laterality Date  . fluid drained from heart      . PERICARDIAL TAP N/A 11/02/2013   Procedure: PERICARDIAL TAP;  Surgeon: Troy Sine, MD;  Location: [facility] CATH LAB;  Service: Cardiovascular;  Laterality: N/A;       Family History  Problem Relation Age of Onset  . Heart failure Father   . Heart attack Father   . Kidney cancer Father   . Diabetes Father   . Pancreatic cancer Paternal Grandmother   . Hypertension Mother   . Healthy Maternal Grandmother   . Healthy Maternal Grandfather   . Healthy Paternal Grandfather   . CAD Other        Aunt with CABG  . Stroke Neg Hx     Social History   Tobacco Use  . Smoking status: Current Some Day Smoker    Packs/day: 0.00    Years: 3.00    Pack years: 0.00    Types: Cigars, Cigarettes  . Smokeless tobacco: Never Used  . Tobacco comment: 1 cigar daily  Vaping Use  . Vaping Use: Never used  Substance Use Topics  . Alcohol use: Yes    Comment: occasional  . Drug use: No    Home Medications Prior to Admission medications   Medication Sig Start Date End Date Taking? Authorizing Provider  amLODipine (NORVASC) 5 MG tablet Take 1 tablet (5 mg total) by mouth daily. 12/16/19 12/15/20 Yes Biagio Borg, MD  atorvastatin (LIPITOR) 10 MG tablet Take 1 tablet by mouth once daily 03/29/20  Yes Biagio Borg, MD  glipiZIDE (GLUCOTROL XL) 10 MG 24 hr tablet Take 1 tablet (10 mg total) by mouth daily with breakfast. 12/16/19  Yes Biagio Borg, MD  ibuprofen (ADVIL) 800 MG tablet Take 1 tablet (800 mg total) by mouth every 8 (eight) hours as needed for moderate pain. 12/16/19  Yes Biagio Borg, MD  insulin NPH Human (NOVOLIN N) 100 UNIT/ML injection Inject 0.5 mLs (50 Units total) into the skin every morning. And syringes 1/day 03/07/20  Yes Renato Shin, MD  pantoprazole (PROTONIX) 40 MG tablet Take 1 tablet (40 mg total) by mouth 2 (two) times daily. 12/16/19  Yes Biagio Borg, MD  Blood Glucose Monitoring Suppl [facility] VERIO) w/Device KIT Use as directed once daily E11.9 01/31/20   Biagio Borg,  MD  cyclobenzaprine (FLEXERIL) 5 MG tablet Take 1 tablet (5 mg total) by mouth 3 (three) times daily as needed for muscle spasms. Patient not taking: Reported on 07/06/2020 12/16/19   Biagio Borg, MD  dicyclomine (BENTYL) 20 MG tablet TAKE 1 TABLET (20 MG TOTAL) BY MOUTH EVERY 6 (SIX) HOURS. Patient not taking: Reported on 07/06/2020 05/03/19   Biagio Borg, MD  glucose blood (ACCU-CHEK GUIDE) test strip Use to check blood sugars twice a day 03/23/20   Biagio Borg, MD  Lancets MISC Use as directed once daily E11.9 01/31/20   Biagio Borg, MD  tadalafil (CIALIS) 20 MG tablet Take 0.5-1 tablets (10-20 mg total) by mouth every other day as needed for erectile dysfunction. 12/16/19   Biagio Borg, MD    Allergies    Metformin and related  Review of Systems   Review of Systems  Constitutional: Positive for chills.  Skin: Positive for wound.  All other systems reviewed and are negative.   Physical Exam Updated Vital Signs BP 116/80   Pulse 88   Temp 98.5 F (36.9 C) (Oral)   Resp (!) 23   Ht '5\' 10"'  (1.778 m)   Wt (!) 175.5 kg   SpO2 97%   BMI 55.53 kg/m   Physical Exam Vitals and nursing note reviewed.  Constitutional:      General: He is not in acute distress.    Appearance: Normal appearance. He is well-developed.  HENT:     Head: Normocephalic and atraumatic.     Right Ear: Hearing normal.     Left Ear: Hearing normal.     Nose: Nose normal.  Eyes:     Conjunctiva/sclera: Conjunctivae normal.     Pupils: Pupils are equal, round, and reactive to light.  Cardiovascular:     Rate and Rhythm: Regular rhythm. Tachycardia present.     Heart sounds: S1 normal and S2 normal. No murmur heard.  No friction rub. No gallop.   Pulmonary:     Effort: Pulmonary effort is normal. No respiratory distress.     Breath sounds: Normal breath sounds.  Chest:     Chest wall: No tenderness.  Abdominal:     General: Bowel sounds are normal.     Palpations: Abdomen is soft.      Tenderness: There is no abdominal tenderness. There is no guarding or rebound. Negative signs include Murphy's sign and McBurney's sign.     Hernia: No hernia is present.  Genitourinary:    Comments: Large indurated and erythematous area right perineum Musculoskeletal:        General: Normal range of motion.     Cervical back: Normal range of  motion and neck supple.  Skin:    General: Skin is warm and dry.     Findings: No rash.  Neurological:     Mental Status: He is alert and oriented to person, place, and time.     GCS: GCS eye subscore is 4. GCS verbal subscore is 5. GCS motor subscore is 6.     Cranial Nerves: No cranial nerve deficit.     Sensory: No sensory deficit.     Coordination: Coordination normal.  Psychiatric:        Speech: Speech normal.        Behavior: Behavior normal.        Thought Content: Thought content normal.     ED Results / Procedures / Treatments   Labs (all labs ordered are listed, but only abnormal results are displayed) Labs Reviewed  COMPREHENSIVE METABOLIC PANEL - Abnormal; Notable for the following components:      Result Value   Sodium 134 (*)    Glucose, Bld 211 (*)    All other components within normal limits  CBC WITH DIFFERENTIAL/PLATELET - Abnormal; Notable for the following components:   WBC 11.1 (*)    MCV 72.6 (*)    MCH 23.2 (*)    All other components within normal limits  URINALYSIS, ROUTINE W REFLEX MICROSCOPIC - Abnormal; Notable for the following components:   Ketones, ur 5 (*)    All other components within normal limits  RESP PANEL BY RT-PCR (FLU A&B, COVID) ARPGX2  CULTURE, BLOOD (SINGLE)  URINE CULTURE  LACTIC ACID, PLASMA  PROTIME-INR  APTT    EKG EKG Interpretation  Date/Time:  Wednesday July 05 2020 23:52:40 EST Ventricular Rate:  98 PR Interval:    QRS Duration: 89 QT Interval:  324 QTC Calculation: 414 R Axis:   26 Text Interpretation: Sinus rhythm ST elev, probable normal early repol pattern No  significant change since last tracing Confirmed by Orpah Greek [phone]) on 07/06/2020 12:13:54 AM   Radiology CT PELVIS W CONTRAST  Result Date: 07/06/2020 CLINICAL DATA:  Scrotal swelling or edema. Diabetes. Abscess to the posterior scrotum for 3 days, now ruptured and draining. EXAM: CT PELVIS WITH CONTRAST TECHNIQUE: Multidetector CT imaging of the pelvis was performed using the standard protocol following the bolus administration of intravenous contrast. CONTRAST:  174m OMNIPAQUE IOHEXOL 300 MG/ML  SOLN COMPARISON:  None. FINDINGS: Urinary Tract: No bladder wall thickening or filling defect. Distal ureters are decompressed. Bowel: Visualized portions of small and large bowel are normal in caliber. No wall thickening or inflammatory changes identified. Vascular/Lymphatic: No evidence of pelvic aneurysm. Prominent groin lymph nodes measuring up to 1.6 cm bilaterally. Nonspecific but likely reactive. Scattered pelvic lymph nodes are not pathologically enlarged, likely reactive. Reproductive: Prostate gland is normal in size. Moderate diffuse scrotal skin swelling and edema with infiltration in the scrotal skin and pelvic soft tissues at the base of the penis. Changes are likely to represent cellulitis. No loculated collection to suggest abscess. No soft tissue gas identified. Testicles are normal in size with symmetrical appearance. Other: No free air or free fluid in the pelvis. No inguinal hernia is identified. Musculoskeletal: No destructive bone lesions. IMPRESSION: 1. Moderate diffuse scrotal skin swelling and edema with infiltration in the scrotal skin and pelvic soft tissues at the base of the penis. Changes are likely to represent cellulitis. No loculated collection to suggest abscess. No soft tissue gas. 2. Prominent groin and pelvic lymph nodes are likely reactive. Electronically Signed  By: Lucienne Capers M.D.   On: 07/06/2020 03:26   DG Chest Port 1 View  Result Date:  07/06/2020 CLINICAL DATA:  Questionable sepsis. Abscess behind the scrotum for 3 days. Diabetes. EXAM: PORTABLE CHEST 1 VIEW COMPARISON:  06/29/2017 FINDINGS: The heart size and mediastinal contours are within normal limits. Both lungs are clear. The visualized skeletal structures are unremarkable. IMPRESSION: No active disease. Electronically Signed   By: Lucienne Capers M.D.   On: 07/06/2020 00:12    Procedures Procedures (including critical care time)  Medications Ordered in ED Medications  piperacillin-tazobactam (ZOSYN) IVPB 3.375 g (3.375 g Intravenous New Bag/Given 07/06/20 0455)  vancomycin (VANCOREADY) IVPB 2000 mg/400 mL (has no administration in time range)  lactated ringers bolus 1,000 mL (0 mLs Intravenous Stopped 07/06/20 0411)  HYDROmorphone (DILAUDID) injection 1 mg (1 mg Intravenous Given 07/06/20 0044)  ondansetron (ZOFRAN) injection 4 mg (4 mg Intravenous Given 07/06/20 0044)  sodium chloride (PF) 0.9 % injection (  Given by Other 07/06/20 0256)  iohexol (OMNIPAQUE) 300 MG/ML solution 100 mL (100 mLs Intravenous Contrast Given 07/06/20 0256)    ED Course  I have reviewed the triage vital signs and the nursing notes.  Pertinent labs & imaging results that were available during my care of the patient were reviewed by me and considered in my medical decision making (see chart for details).    MDM Rules/Calculators/A&P                          Patient presents to the emergency department for evaluation of painful swelling and drainage from his scrotum.  Examination reveals significant swelling and induration of the right side of his scrotum and perineum.  He is a diabetic.  Fournier's gangrene was therefore considered.  Lab work was unremarkable.  Patient underwent CT scan to further evaluate.  He does have fairly extensive cellulitis but no abscess and no gas formation or signs of Fournier's.  I have recommended hospitalization for initial treatment with IV  antibiotics.  Final Clinical Impression(s) / ED Diagnoses Final diagnoses:  Cellulitis, scrotum    Rx / DC Orders ED Discharge Orders    None       Statia Burdick, Gwenyth Allegra, MD 07/06/20 [phone]

## 2020-07-05 NOTE — ED Triage Notes (Addendum)
Patient here from home reporting abscess behind scrotum x3 days. States that it has ruptured and is draining. Hx Type 2 Diabetic.

## 2020-07-06 ENCOUNTER — Other Ambulatory Visit: Payer: Self-pay

## 2020-07-06 ENCOUNTER — Emergency Department (HOSPITAL_COMMUNITY): Payer: No Typology Code available for payment source

## 2020-07-06 ENCOUNTER — Encounter (HOSPITAL_COMMUNITY): Payer: Self-pay

## 2020-07-06 DIAGNOSIS — N492 Inflammatory disorders of scrotum: Secondary | ICD-10-CM | POA: Diagnosis present

## 2020-07-06 DIAGNOSIS — E119 Type 2 diabetes mellitus without complications: Secondary | ICD-10-CM

## 2020-07-06 DIAGNOSIS — A419 Sepsis, unspecified organism: Secondary | ICD-10-CM

## 2020-07-06 LAB — CBC WITH DIFFERENTIAL/PLATELET
Abs Immature Granulocytes: 0.05 10*3/uL (ref 0.00–0.07)
Basophils Absolute: 0 10*3/uL (ref 0.0–0.1)
Basophils Relative: 0 %
Eosinophils Absolute: 0.1 10*3/uL (ref 0.0–0.5)
Eosinophils Relative: 1 %
HCT: 42.2 % (ref 39.0–52.0)
Hemoglobin: 13.5 g/dL (ref 13.0–17.0)
Immature Granulocytes: 1 %
Lymphocytes Relative: 24 %
Lymphs Abs: 2.6 10*3/uL (ref 0.7–4.0)
MCH: 23.2 pg — ABNORMAL LOW (ref 26.0–34.0)
MCHC: 32 g/dL (ref 30.0–36.0)
MCV: 72.6 fL — ABNORMAL LOW (ref 80.0–100.0)
Monocytes Absolute: 1 10*3/uL (ref 0.1–1.0)
Monocytes Relative: 9 %
Neutro Abs: 7.3 10*3/uL (ref 1.7–7.7)
Neutrophils Relative %: 65 %
Platelets: 223 10*3/uL (ref 150–400)
RBC: 5.81 MIL/uL (ref 4.22–5.81)
RDW: 14.9 % (ref 11.5–15.5)
WBC: 11.1 10*3/uL — ABNORMAL HIGH (ref 4.0–10.5)
nRBC: 0 % (ref 0.0–0.2)

## 2020-07-06 LAB — URINALYSIS, ROUTINE W REFLEX MICROSCOPIC
Bilirubin Urine: NEGATIVE
Glucose, UA: NEGATIVE mg/dL
Hgb urine dipstick: NEGATIVE
Ketones, ur: 5 mg/dL — AB
Leukocytes,Ua: NEGATIVE
Nitrite: NEGATIVE
Protein, ur: NEGATIVE mg/dL
Specific Gravity, Urine: 1.029 (ref 1.005–1.030)
pH: 5 (ref 5.0–8.0)

## 2020-07-06 LAB — COMPREHENSIVE METABOLIC PANEL
ALT: 23 U/L (ref 0–44)
AST: 20 U/L (ref 15–41)
Albumin: 4.3 g/dL (ref 3.5–5.0)
Alkaline Phosphatase: 48 U/L (ref 38–126)
Anion gap: 11 (ref 5–15)
BUN: 15 mg/dL (ref 6–20)
CO2: 25 mmol/L (ref 22–32)
Calcium: 9.2 mg/dL (ref 8.9–10.3)
Chloride: 98 mmol/L (ref 98–111)
Creatinine, Ser: 0.98 mg/dL (ref 0.61–1.24)
GFR, Estimated: >60 mL/min (ref >60)
Glucose, Bld: 211 mg/dL — ABNORMAL HIGH (ref 70–99)
Potassium: 4.1 mmol/L (ref 3.5–5.1)
Sodium: 134 mmol/L — ABNORMAL LOW (ref 135–145)
Total Bilirubin: 0.8 mg/dL (ref 0.3–1.2)
Total Protein: 7.9 g/dL (ref 6.5–8.1)

## 2020-07-06 LAB — HIV ANTIBODY (ROUTINE TESTING W REFLEX): HIV Screen 4th Generation wRfx: NONREACTIVE

## 2020-07-06 LAB — GLUCOSE, CAPILLARY
Glucose-Capillary: 192 mg/dL — ABNORMAL HIGH (ref 70–99)
Glucose-Capillary: 225 mg/dL — ABNORMAL HIGH (ref 70–99)
Glucose-Capillary: 242 mg/dL — ABNORMAL HIGH (ref 70–99)
Glucose-Capillary: 234 mg/dL — ABNORMAL HIGH (ref 70–99)

## 2020-07-06 LAB — RESP PANEL BY RT-PCR (FLU A&B, COVID) ARPGX2
Influenza A by PCR: NEGATIVE
Influenza B by PCR: NEGATIVE
SARS Coronavirus 2 by RT PCR: NEGATIVE

## 2020-07-06 LAB — PROTIME-INR
INR: 1 (ref 0.8–1.2)
Prothrombin Time: 12.5 seconds (ref 11.4–15.2)

## 2020-07-06 LAB — HEMOGLOBIN A1C
Hgb A1c MFr Bld: 9.9 % — ABNORMAL HIGH (ref 4.8–5.6)
Mean Plasma Glucose: 237.43 mg/dL

## 2020-07-06 LAB — APTT: aPTT: 28 seconds (ref 24–36)

## 2020-07-06 LAB — LACTIC ACID, PLASMA: Lactic Acid, Venous: 1 mmol/L (ref 0.5–1.9)

## 2020-07-06 MED ORDER — IOHEXOL 300 MG/ML  SOLN
100.0000 mL | Freq: Once | INTRAMUSCULAR | Status: AC | PRN
  Administered 2020-07-06: 100 mL via INTRAVENOUS

## 2020-07-06 MED ORDER — SODIUM CHLORIDE (PF) 0.9 % IJ SOLN
INTRAMUSCULAR | Status: AC
  Filled 2020-07-06: qty 50

## 2020-07-06 MED ORDER — VANCOMYCIN HCL 1250 MG/250ML IV SOLN
1250.0000 mg | Freq: Three times a day (TID) | INTRAVENOUS | Status: DC
  Administered 2020-07-06 – 2020-07-07 (×3): 1250 mg via INTRAVENOUS
  Filled 2020-07-06 (×5): qty 250

## 2020-07-06 MED ORDER — ACETAMINOPHEN 325 MG PO TABS: 650.0000 mg | ORAL_TABLET | Freq: Four times a day (QID) | ORAL | Status: DC | PRN

## 2020-07-06 MED ORDER — INSULIN NPH (HUMAN) (ISOPHANE) 100 UNIT/ML ~~LOC~~ SUSP
50.0000 [IU] | Freq: Every day | SUBCUTANEOUS | Status: DC
  Administered 2020-07-06 – 2020-07-07 (×2): 50 [IU] via SUBCUTANEOUS
  Filled 2020-07-06: qty 10

## 2020-07-06 MED ORDER — PIPERACILLIN-TAZOBACTAM 3.375 G IVPB 30 MIN
3.3750 g | Freq: Once | INTRAVENOUS | Status: AC
  Administered 2020-07-06: 3.375 g via INTRAVENOUS
  Filled 2020-07-06: qty 50

## 2020-07-06 MED ORDER — TRAZODONE HCL 50 MG PO TABS
50.0000 mg | ORAL_TABLET | Freq: Once | ORAL | Status: AC
  Administered 2020-07-06: 50 mg via ORAL
  Filled 2020-07-06: qty 1

## 2020-07-06 MED ORDER — SODIUM CHLORIDE 0.9 % IV SOLN
INTRAVENOUS | Status: DC | PRN
  Administered 2020-07-06: 250 mL via INTRAVENOUS

## 2020-07-06 MED ORDER — PIPERACILLIN-TAZOBACTAM 3.375 G IVPB
3.3750 g | Freq: Three times a day (TID) | INTRAVENOUS | Status: DC
  Administered 2020-07-06 – 2020-07-07 (×3): 3.375 g via INTRAVENOUS
  Filled 2020-07-06 (×3): qty 50

## 2020-07-06 MED ORDER — ATORVASTATIN CALCIUM 10 MG PO TABS
10.0000 mg | ORAL_TABLET | Freq: Every day | ORAL | Status: DC
  Administered 2020-07-06 – 2020-07-07 (×2): 10 mg via ORAL
  Filled 2020-07-06 (×2): qty 1

## 2020-07-06 MED ORDER — ACETAMINOPHEN 650 MG RE SUPP: 650.0000 mg | Freq: Four times a day (QID) | RECTAL | Status: DC | PRN

## 2020-07-06 MED ORDER — ENOXAPARIN SODIUM 80 MG/0.8ML ~~LOC~~ SOLN
80.0000 mg | SUBCUTANEOUS | Status: DC
  Administered 2020-07-06 – 2020-07-07 (×2): 80 mg via SUBCUTANEOUS
  Filled 2020-07-06 (×2): qty 0.8

## 2020-07-06 MED ORDER — INSULIN ASPART 100 UNIT/ML ~~LOC~~ SOLN
0.0000 [IU] | Freq: Three times a day (TID) | SUBCUTANEOUS | Status: DC
  Administered 2020-07-06 (×2): 5 [IU] via SUBCUTANEOUS
  Administered 2020-07-06: 3 [IU] via SUBCUTANEOUS
  Administered 2020-07-07: 8 [IU] via SUBCUTANEOUS
  Administered 2020-07-07: 5 [IU] via SUBCUTANEOUS
  Filled 2020-07-06: qty 0.15

## 2020-07-06 MED ORDER — INSULIN ASPART 100 UNIT/ML ~~LOC~~ SOLN
0.0000 [IU] | Freq: Every day | SUBCUTANEOUS | Status: DC
  Administered 2020-07-06: 2 [IU] via SUBCUTANEOUS
  Filled 2020-07-06: qty 0.05

## 2020-07-06 MED ORDER — PANTOPRAZOLE SODIUM 40 MG PO TBEC
40.0000 mg | DELAYED_RELEASE_TABLET | Freq: Two times a day (BID) | ORAL | Status: DC
  Administered 2020-07-06 – 2020-07-07 (×3): 40 mg via ORAL
  Filled 2020-07-06 (×3): qty 1

## 2020-07-06 MED ORDER — VANCOMYCIN HCL 2000 MG/400ML IV SOLN
2000.0000 mg | Freq: Once | INTRAVENOUS | Status: AC
  Administered 2020-07-06: 2000 mg via INTRAVENOUS
  Filled 2020-07-06: qty 400

## 2020-07-06 MED ORDER — HYDROCODONE-ACETAMINOPHEN 5-325 MG PO TABS
1.0000 | ORAL_TABLET | ORAL | Status: DC | PRN
  Administered 2020-07-06: 2 via ORAL
  Filled 2020-07-06: qty 2

## 2020-07-06 NOTE — H&P (Signed)
History and Physical    Caleb [name] BLT:[id number] DOB: 1980-11-03 DOA: 07/05/2020  PCP: Biagio Borg, MD Patient coming from: Home  Chief Complaint: Scrotal abscess  HPI: [name] is a 39 y.o. male with medical history significant of hypertension, GERD, morbid obesity, uncontrolled insulin-dependent type 2 diabetes, sleep apnea presenting to the ED for evaluation of a scrotal abscess.  Patient states his symptoms began about a week ago but the problem has been worse for the past 3 days.  It started out as a small area of infection that has continued to spread.  States yesterday the area started to drain large amounts of pus and blood.  He has had pain in this area.  He has not checked his temperature but had chills yesterday.  No other complaints.  Denies cough, shortness of breath, chest pain, nausea, vomiting, abdominal pain, or diarrhea.  He has been vaccinated against Covid.  ED Course: Afebrile.  Slightly tachycardic and tachypneic.  Blood pressure elevated on arrival.  WBC 11.1, hemoglobin 13.5, hematocrit 42.2, platelet 223K.  Sodium 134, potassium 4.1, chloride 98, bicarb 25, BUN 15, creatinine 0.9, glucose 211.  Lactic acid 1.0.  INR 1.0.  Blood culture x2 pending.  UA not suggestive of infection.  Urine culture pending.  SARS-CoV-2 PCR test negative.  Influenza panel negative.  Chest x-ray not suggestive of pneumonia.  CT of pelvis with contrast showing moderate diffuse scrotal skin swelling and edema with infiltration in the scrotal skin and pelvic soft tissues at the base of the penis.  Changes likely represent cellulitis.  No loculated collection to suggest abscess.  No soft tissue gas.  Prominent groin and pelvic lymph nodes, likely reactive.  Patient was given IV Dilaudid 1 mg, 1 L LR bolus, vancomycin, Zosyn, and Zofran.  Review of Systems:  All systems reviewed and apart from history of presenting illness, are negative.  Past Medical History:  Diagnosis Date  .  Cardiac tamponade 10/2013  . 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 Mild enlargement of pulmonary artery by CT 11/01/13.  Marland Kitchen GERD (gastroesophageal reflux disease)   . HTN (hypertension)   . Hx of echocardiogram    Echo (12/14/13):  Mod LVH, EF 50-55%, no RWMA, trivial Eff.  . LV dysfunction    a. 10/2013:  EF 45-50% with followup echo 55-60%.  . Morbid obesity ([place])   . Pericarditis    a. 10/2013: Acute pericarditis with cardiac tamponade s/p pericardiocentesis.  . Sleep apnea   . Type 2 diabetes mellitus without complication, without long-term current use of insulin ([place]) 01/02/2017    Past Surgical History:  Procedure Laterality Date  . fluid drained from heart    . PERICARDIAL TAP N/A 11/02/2013   Procedure: PERICARDIAL TAP;  Surgeon: Troy Sine, MD;  Location: [facility] CATH LAB;  Service: Cardiovascular;  Laterality: N/A;     reports that he has been smoking cigars and cigarettes. He has been smoking about 0.00 packs per day for the past 3.00 years. He has never used smokeless tobacco. He reports current alcohol use. He reports that he does not use drugs.  Allergies  Allergen Reactions  . Metformin And Related Diarrhea    Family History  Problem Relation Age of Onset  . Heart failure Father   . Heart attack Father   . Kidney cancer Father   . Diabetes Father   . Pancreatic cancer Paternal Grandmother   . Hypertension Mother   . Healthy Maternal Grandmother   .  Healthy Maternal Grandfather   . Healthy Paternal Grandfather   . CAD Other        Aunt with CABG  . Stroke Neg Hx     Prior to Admission medications   Medication Sig Start Date End Date Taking? Authorizing Provider  amLODipine (NORVASC) 5 MG tablet Take 1 tablet (5 mg total) by mouth daily. 12/16/19 12/15/20 Yes Biagio Borg, MD  atorvastatin (LIPITOR) 10 MG tablet Take 1 tablet by mouth once daily 03/29/20  Yes Biagio Borg, MD  glipiZIDE (GLUCOTROL XL) 10 MG 24 hr tablet Take 1 tablet (10 mg total) by mouth daily with  breakfast. 12/16/19  Yes Biagio Borg, MD  ibuprofen (ADVIL) 800 MG tablet Take 1 tablet (800 mg total) by mouth every 8 (eight) hours as needed for moderate pain. 12/16/19  Yes Biagio Borg, MD  insulin NPH Human (NOVOLIN N) 100 UNIT/ML injection Inject 0.5 mLs (50 Units total) into the skin every morning. And syringes 1/day 03/07/20  Yes Renato Shin, MD  pantoprazole (PROTONIX) 40 MG tablet Take 1 tablet (40 mg total) by mouth 2 (two) times daily. 12/16/19  Yes Biagio Borg, MD  Blood Glucose Monitoring Suppl [facility] VERIO) w/Device KIT Use as directed once daily E11.9 01/31/20   Biagio Borg, MD  cyclobenzaprine (FLEXERIL) 5 MG tablet Take 1 tablet (5 mg total) by mouth 3 (three) times daily as needed for muscle spasms. Patient not taking: Reported on 07/06/2020 12/16/19   Biagio Borg, MD  dicyclomine (BENTYL) 20 MG tablet TAKE 1 TABLET (20 MG TOTAL) BY MOUTH EVERY 6 (SIX) HOURS. Patient not taking: Reported on 07/06/2020 05/03/19   Biagio Borg, MD  glucose blood (ACCU-CHEK GUIDE) test strip Use to check blood sugars twice a day 03/23/20   Biagio Borg, MD  Lancets MISC Use as directed once daily E11.9 01/31/20   Biagio Borg, MD  tadalafil (CIALIS) 20 MG tablet Take 0.5-1 tablets (10-20 mg total) by mouth every other day as needed for erectile dysfunction. 12/16/19   Biagio Borg, MD    Physical Exam: Vitals:   07/06/20 0330 07/06/20 0345 07/06/20 0400 07/06/20 0505  BP: 112/82  116/80 127/70  Pulse: 92 93 88 89  Resp: (!) 22 18 (!) 23 (!) 24  Temp:      TempSrc:      SpO2: 94% 97% 97% 96%  Weight:      Height:        Physical Exam Constitutional:      General: He is not in acute distress. HENT:     Head: Normocephalic and atraumatic.  Eyes:     Extraocular Movements: Extraocular movements intact.     Conjunctiva/sclera: Conjunctivae normal.  Cardiovascular:     Rate and Rhythm: Normal rate and regular rhythm.     Pulses: Normal pulses.  Pulmonary:     Effort: Pulmonary  effort is normal. No respiratory distress.     Breath sounds: Normal breath sounds. No wheezing or rales.  Abdominal:     General: Bowel sounds are normal. There is no distension.     Palpations: Abdomen is soft.     Tenderness: There is no abdominal tenderness.  Genitourinary:    Comments: Chaperone present at bedside (patient's nurse) Large area of induration and erythema noted on the right scrotum/perineum Musculoskeletal:        General: No swelling or tenderness.     Cervical back: Normal range of motion and neck  supple.  Skin:    General: Skin is warm and dry.  Neurological:     General: No focal deficit present.     Mental Status: He is alert and oriented to person, place, and time.     Labs on Admission: I have personally reviewed following labs and imaging studies  CBC: Recent Labs  Lab 07/06/20 0025  WBC 11.1*  NEUTROABS 7.3  HGB 13.5  HCT 42.2  MCV 72.6*  PLT 937   Basic Metabolic Panel: Recent Labs  Lab 07/06/20 0025  NA 134*  K 4.1  CL 98  CO2 25  GLUCOSE 211*  BUN 15  CREATININE 0.98  CALCIUM 9.2   GFR: Estimated Creatinine Clearance: 168 mL/min (by C-G formula based on SCr of 0.98 mg/dL). Liver Function Tests: Recent Labs  Lab 07/06/20 0025  AST 20  ALT 23  ALKPHOS 48  BILITOT 0.8  PROT 7.9  ALBUMIN 4.3   No results for input(s): LIPASE, AMYLASE in the last 168 hours. No results for input(s): AMMONIA in the last 168 hours. Coagulation Profile: Recent Labs  Lab 07/06/20 0025  INR 1.0   Cardiac Enzymes: No results for input(s): CKTOTAL, CKMB, CKMBINDEX, TROPONINI in the last 168 hours. BNP (last 3 results) No results for input(s): PROBNP in the last 8760 hours. HbA1C: No results for input(s): HGBA1C in the last 72 hours. CBG: No results for input(s): GLUCAP in the last 168 hours. Lipid Profile: No results for input(s): CHOL, HDL, LDLCALC, TRIG, CHOLHDL, LDLDIRECT in the last 72 hours. Thyroid Function Tests: No results for  input(s): TSH, T4TOTAL, FREET4, T3FREE, THYROIDAB in the last 72 hours. Anemia Panel: No results for input(s): VITAMINB12, FOLATE, FERRITIN, TIBC, IRON, RETICCTPCT in the last 72 hours. Urine analysis:    Component Value Date/Time   COLORURINE YELLOW 07/06/2020 0051   APPEARANCEUR CLEAR 07/06/2020 0051   LABSPEC 1.029 07/06/2020 0051   PHURINE 5.0 07/06/2020 0051   GLUCOSEU NEGATIVE 07/06/2020 0051   GLUCOSEU >=1000 (A) 12/20/2019 1614   HGBUR NEGATIVE 07/06/2020 0051   BILIRUBINUR NEGATIVE 07/06/2020 0051   KETONESUR 5 (A) 07/06/2020 0051   PROTEINUR NEGATIVE 07/06/2020 0051   UROBILINOGEN 0.2 12/20/2019 1614   NITRITE NEGATIVE 07/06/2020 0051   LEUKOCYTESUR NEGATIVE 07/06/2020 0051    Radiological Exams on Admission: CT PELVIS W CONTRAST  Result Date: 07/06/2020 CLINICAL DATA:  Scrotal swelling or edema. Diabetes. Abscess to the posterior scrotum for 3 days, now ruptured and draining. EXAM: CT PELVIS WITH CONTRAST TECHNIQUE: Multidetector CT imaging of the pelvis was performed using the standard protocol following the bolus administration of intravenous contrast. CONTRAST:  152m OMNIPAQUE IOHEXOL 300 MG/ML  SOLN COMPARISON:  None. FINDINGS: Urinary Tract: No bladder wall thickening or filling defect. Distal ureters are decompressed. Bowel: Visualized portions of small and large bowel are normal in caliber. No wall thickening or inflammatory changes identified. Vascular/Lymphatic: No evidence of pelvic aneurysm. Prominent groin lymph nodes measuring up to 1.6 cm bilaterally. Nonspecific but likely reactive. Scattered pelvic lymph nodes are not pathologically enlarged, likely reactive. Reproductive: Prostate gland is normal in size. Moderate diffuse scrotal skin swelling and edema with infiltration in the scrotal skin and pelvic soft tissues at the base of the penis. Changes are likely to represent cellulitis. No loculated collection to suggest abscess. No soft tissue gas identified.  Testicles are normal in size with symmetrical appearance. Other: No free air or free fluid in the pelvis. No inguinal hernia is identified. Musculoskeletal: No destructive bone lesions. IMPRESSION: 1.  Moderate diffuse scrotal skin swelling and edema with infiltration in the scrotal skin and pelvic soft tissues at the base of the penis. Changes are likely to represent cellulitis. No loculated collection to suggest abscess. No soft tissue gas. 2. Prominent groin and pelvic lymph nodes are likely reactive. Electronically Signed   By: Lucienne Capers M.D.   On: 07/06/2020 03:26   DG Chest Port 1 View  Result Date: 07/06/2020 CLINICAL DATA:  Questionable sepsis. Abscess behind the scrotum for 3 days. Diabetes. EXAM: PORTABLE CHEST 1 VIEW COMPARISON:  06/29/2017 FINDINGS: The heart size and mediastinal contours are within normal limits. Both lungs are clear. The visualized skeletal structures are unremarkable. IMPRESSION: No active disease. Electronically Signed   By: Lucienne Capers M.D.   On: 07/06/2020 00:12    EKG: Independently reviewed.  Sinus rhythm, no significant change since prior tracing.  Assessment/Plan Principal Problem:   Cellulitis of scrotum Active Problems:   Essential hypertension   GERD (gastroesophageal reflux disease)   Sepsis (HCC)   Type 2 diabetes mellitus (HCC)   Sepsis secondary to cellulitis of scrotum/perineum: Meets sepsis criteria - 3 SIRS (tachycardia, tachypnea, mild leukocytosis) and has a source of infection.  No lactic acidosis or hypotension to suggest severe sepsis.  CT of pelvis with contrast showing moderate diffuse scrotal skin swelling and edema with infiltration in the scrotal skin and pelvic soft tissues at the base of the penis.  Changes likely represent cellulitis.  No loculated collection to suggest abscess.  No soft tissue gas.  Prominent groin and pelvic lymph nodes, likely reactive.  He does have uncontrolled insulin-dependent diabetes, last A1c 13.4  on 02/11/2020. -Patient received 1 L fluid bolus in the ED, tachycardia resolved.  Continue broad-spectrum antibiotics at this time including vancomycin and Zosyn.  Norco as needed for pain.  Blood culture x2 pending.  Continue to monitor WBC count.  Uncontrolled insulin-dependent type 2 diabetes: Last A1c 13.4 on 02/11/2020. -Repeat A1c.  Hold glipizide.  Continue home Novolin 50 units every morning.  Order sliding scale insulin moderate ACHS.   Hypertension: Currently normotensive. -Hold antihypertensives at this time in the setting of sepsis  Hyperlipidemia -Continue Lipitor  GERD -Continue PPI  DVT prophylaxis: Lovenox Code Status: Full code Family Communication: No family available this time. Disposition Plan: Status is: Inpatient  Remains inpatient appropriate because:IV treatments appropriate due to intensity of illness or inability to take PO and Inpatient level of care appropriate due to severity of illness   Dispo: The patient is from: Home              Anticipated d/c is to: Home              Anticipated d/c date is: 3 days              Patient currently is not medically stable to d/c.  The medical decision making on this patient was of high complexity and the patient is at high risk for clinical deterioration, therefore this is a level 3 visit.  Shela Leff MD [facility]  If 7PM-7AM, please contact night-coverage [email or web address]  07/06/2020, 5:30 AM

## 2020-07-06 NOTE — Progress Notes (Signed)
A consult was received from an ED physician for Vancomycin per pharmacy dosing.  The patient's profile has been reviewed for ht/wt/allergies/indication/available labs.   A one time order has been placed for Vancomycin 2gm IV.  Further antibiotics/pharmacy consults should be ordered by admitting physician if indicated.                       Thank you, Junita Push PharmD 07/06/2020  4:46 AM

## 2020-07-06 NOTE — Progress Notes (Signed)
Pharmacy Antibiotic Note  Caleb [name] is a 39 y.o. male admitted on 07/05/2020 with scrotal cellulitis.  Pharmacy has been consulted for Vancomycin + Zosyn dosing. NCrCl>178ml/min  Plan: Zosyn 3.375g IV q8h (4 hour infusion).  Vancomycin 1500mg  IV q8h Monitor renal function and cx data  Daily Scr while on Vancomycin + Zosyn  Height: 5\' 10"  (177.8 cm) Weight: (!) 175.5 kg (387 lb) IBW/kg (Calculated) : 73  Temp (24hrs), Avg:98.5 F (36.9 C), Min:98.5 F (36.9 C), Max:98.5 F (36.9 C)  Recent Labs  Lab 07/06/20 0022 07/06/20 0025  WBC  --  11.1*  CREATININE  --  0.98  LATICACIDVEN 1.0  --     Estimated Creatinine Clearance: 168 mL/min (by C-G formula based on SCr of 0.98 mg/dL).    Allergies  Allergen Reactions  . Metformin And Related Diarrhea    Antimicrobials this admission: 11/25 Vancomycin >>  11/25 Zosyn >>   Dose adjustments this admission:  Microbiology results: 11/25 BCx:  11/25 UCx:   11/25 Resp PCR: negative for COVID/influenza  Thank you for allowing pharmacy to be a part of this patient's care.  12/25 PharmD, BCPS 07/06/2020 5:29 AM

## 2020-07-06 NOTE — Progress Notes (Signed)
PROGRESS NOTE    Caleb BunMyron T [name]  WUJ:[id number]:[id number] DOB: 1981/02/17 DOA: 07/05/2020 PCP: Corwin LevinsJohn, James W, MD   Brief Narrative:  Caleb [name] is a 39 y.o. male with medical history significant of hypertension, GERD, morbid obesity, uncontrolled insulin-dependent type 2 diabetes, sleep apnea presenting to the ED for evaluation of a scrotal abscess.  Patient states his symptoms began about a week ago but the problem has been worse for the past 3 days.  It started out as a small area of infection that has continued to spread.  States yesterday the area started to drain large amounts of pus and blood.  He has had pain in this area.  He has not checked his temperature but had chills yesterday.  No other complaints.  Denies cough, shortness of breath, chest pain, nausea, vomiting, abdominal pain, or diarrhea.    Assessment & Plan:   Principal Problem:   Cellulitis of scrotum Active Problems:   Essential hypertension   GERD (gastroesophageal reflux disease)   Sepsis (HCC)   Type 2 diabetes mellitus (HCC)   Sepsis secondary to cellulitis of scrotum/perineum, POA:  - Meets sepsis criteria - 3 SIRS (tachycardia, tachypnea, leukocytosis) with notable cellulitis; He does not meet criteria for severe sepsis at this point  - CT of pelvis with contrast showing "moderate diffuse scrotal skin swelling and edema with infiltration in the scrotal skin and pelvic soft tissues at the base of the penis. Changes likely represent cellulitis.  No loculated collection to suggest abscess.  No soft tissue gas.  Prominent groin and pelvic lymph nodes, likely reactive"   - He does have uncontrolled insulin-dependent diabetes, last A1c 13.4 on 02/11/2020. - Continue vancomycin and Zosyn, follow cultures - Norco as needed for pain.  Blood culture x2 pending.  Continue to monitor WBC count.  Uncontrolled insulin-dependent type 2 diabetes:  - A1c 9.9 today - Last A1c 13.4 on 02/11/2020. -Hold glipizide.  Continue home  Novolin 50 units every morning.   -Continue sliding scale insulin - moderate ACHS.   Hypertension: Currently normotensive. -Hold antihypertensives at this time in the setting of sepsis  Hyperlipidemia -Continue Lipitor  GERD -Continue PPI  DVT prophylaxis: Lovenox Code Status: Full code Family Communication: No family available this time.  Status is: Inpatient  Dispo: The patient is from: Home              Anticipated d/c is to: Home              Anticipated d/c date is: 24-48h              Patient currently is not medically stable for discharge given ongoing need for IV antibiotics in the setting of severe cellulitis in high risk area  Consultants:   None  Procedures:   None  Antimicrobials:  Ceftriaxone  Subjective: Pain much more well controlled, patient indicates ongoing drainage from scrotal region otherwise improvement swelling edema.  Denies chest pain, shortness of breath, headache, fevers, chills.  Objective: Vitals:   07/06/20 0400 07/06/20 0505 07/06/20 0554 07/06/20 0653  BP: 116/80 127/70 121/79 121/84  Pulse: 88 89 88 88  Resp: (!) 23 (!) 24 19 18   Temp:    (!) 97.5 F (36.4 C)  TempSrc:    Oral  SpO2: 97% 96% 97% 98%  Weight:   (!) 178.7 kg   Height:   5\' 10"  (1.778 m)     Intake/Output Summary (Last 24 hours) at 07/06/2020 [id number] Last data filed at 07/06/2020  0411 Gross per 24 hour  Intake 1000 ml  Output --  Net 1000 ml   Filed Weights   07/05/20 2305 07/06/20 0554  Weight: (!) 175.5 kg (!) 178.7 kg    Examination:  General:  Pleasantly resting in bed, No acute distress. HEENT:  Normocephalic atraumatic.  Sclerae nonicteric, noninjected.  Extraocular movements intact bilaterally. Neck:  Without mass or deformity.  Trachea is midline. Lungs:  Clear to auscultate bilaterally without rhonchi, wheeze, or rales. Heart:  Regular rate and rhythm.  Without murmurs, rubs, or gallops. Abdomen:  Soft, nontender, nondistended.  Without  guarding or rebound. Extremities: Without cyanosis, clubbing, edema, or obvious deformity. Vascular:  Dorsalis pedis and posterior tibial pulses palpable bilaterally. Skin:  Warm and dry, blanching erythema improving, 1 x 2 cm.  Lesion left inferior aspect of the scrotum.  Draining purulent material.  Data Reviewed: I have personally reviewed following labs and imaging studies  CBC: Recent Labs  Lab 07/06/20 0025  WBC 11.1*  NEUTROABS 7.3  HGB 13.5  HCT 42.2  MCV 72.6*  PLT 223   Basic Metabolic Panel: Recent Labs  Lab 07/06/20 0025  NA 134*  K 4.1  CL 98  CO2 25  GLUCOSE 211*  BUN 15  CREATININE 0.98  CALCIUM 9.2   GFR: Estimated Creatinine Clearance: 169.9 mL/min (by C-G formula based on SCr of 0.98 mg/dL). Liver Function Tests: Recent Labs  Lab 07/06/20 0025  AST 20  ALT 23  ALKPHOS 48  BILITOT 0.8  PROT 7.9  ALBUMIN 4.3   No results for input(s): LIPASE, AMYLASE in the last 168 hours. No results for input(s): AMMONIA in the last 168 hours. Coagulation Profile: Recent Labs  Lab 07/06/20 0025  INR 1.0   Cardiac Enzymes: No results for input(s): CKTOTAL, CKMB, CKMBINDEX, TROPONINI in the last 168 hours. BNP (last 3 results) No results for input(s): PROBNP in the last 8760 hours. HbA1C: No results for input(s): HGBA1C in the last 72 hours. CBG: No results for input(s): GLUCAP in the last 168 hours. Lipid Profile: No results for input(s): CHOL, HDL, LDLCALC, TRIG, CHOLHDL, LDLDIRECT in the last 72 hours. Thyroid Function Tests: No results for input(s): TSH, T4TOTAL, FREET4, T3FREE, THYROIDAB in the last 72 hours. Anemia Panel: No results for input(s): VITAMINB12, FOLATE, FERRITIN, TIBC, IRON, RETICCTPCT in the last 72 hours. Sepsis Labs: Recent Labs  Lab 07/06/20 0022  LATICACIDVEN 1.0    Recent Results (from the past 240 hour(s))  Resp Panel by RT-PCR (Flu A&B, Covid) Nasopharyngeal Swab     Status: None   Collection Time: 07/06/20 12:51 AM     Specimen: Nasopharyngeal Swab; Nasopharyngeal(NP) swabs in vial transport medium  Result Value Ref Range Status   SARS Coronavirus 2 by RT PCR NEGATIVE NEGATIVE Final    Comment: (NOTE) SARS-CoV-2 target nucleic acids are NOT DETECTED.  The SARS-CoV-2 RNA is generally detectable in upper respiratory specimens during the acute phase of infection. The lowest concentration of SARS-CoV-2 viral copies this assay can detect is 138 copies/mL. A negative result does not preclude SARS-Cov-2 infection and should not be used as the sole basis for treatment or other patient management decisions. A negative result may occur with  improper specimen collection/handling, submission of specimen other than nasopharyngeal swab, presence of viral mutation(s) within the areas targeted by this assay, and inadequate number of viral copies(<138 copies/mL). A negative result must be combined with clinical observations, patient history, and epidemiological information. The expected result is Negative.  Fact Sheet  for Patients:  BloggerCourse.com  Fact Sheet for Healthcare Providers:  SeriousBroker.it  This test is no t yet approved or cleared by the [place] FDA and  has been authorized for detection and/or diagnosis of SARS-CoV-2 by FDA under an Emergency Use Authorization (EUA). This EUA will remain  in effect (meaning this test can be used) for the duration of the COVID-19 declaration under Section 564(b)(1) of the Act, 21 U.S.C.section 360bbb-3(b)(1), unless the authorization is terminated  or revoked sooner.       Influenza A by PCR NEGATIVE NEGATIVE Final   Influenza B by PCR NEGATIVE NEGATIVE Final    Comment: (NOTE) The Xpert Xpress SARS-CoV-2/FLU/RSV plus assay is intended as an aid in the diagnosis of influenza from Nasopharyngeal swab specimens and should not be used as a sole basis for treatment. Nasal washings and aspirates are  unacceptable for Xpert Xpress SARS-CoV-2/FLU/RSV testing.  Fact Sheet for Patients: BloggerCourse.com  Fact Sheet for Healthcare Providers: SeriousBroker.it  This test is not yet approved or cleared by the [place] FDA and has been authorized for detection and/or diagnosis of SARS-CoV-2 by FDA under an Emergency Use Authorization (EUA). This EUA will remain in effect (meaning this test can be used) for the duration of the COVID-19 declaration under Section 564(b)(1) of the Act, 21 U.S.C. section 360bbb-3(b)(1), unless the authorization is terminated or revoked.  Performed at [facility], [address] [place], [place] [address]          Radiology Studies: CT PELVIS W CONTRAST  Result Date: 07/06/2020 CLINICAL DATA:  Scrotal swelling or edema. Diabetes. Abscess to the posterior scrotum for 3 days, now ruptured and draining. EXAM: CT PELVIS WITH CONTRAST TECHNIQUE: Multidetector CT imaging of the pelvis was performed using the standard protocol following the bolus administration of intravenous contrast. CONTRAST:  OMNIPAQUE IOHEXOL 300 MG/ML  SOLN COMPARISON:  None. FINDINGS: Urinary Tract: No bladder wall thickening or filling defect. Distal ureters are decompressed. Bowel: Visualized portions of small and large bowel are normal in caliber. No wall thickening or inflammatory changes identified. Vascular/Lymphatic: No evidence of pelvic aneurysm. Prominent groin lymph nodes measuring up to 1.6 cm bilaterally. Nonspecific but likely reactive. Scattered pelvic lymph nodes are not pathologically enlarged, likely reactive. Reproductive: Prostate gland is normal in size. Moderate diffuse scrotal skin swelling and edema with infiltration in the scrotal skin and pelvic soft tissues at the base of the penis. Changes are likely to represent cellulitis. No loculated collection to suggest abscess. No soft tissue gas  identified. Testicles are normal in size with symmetrical appearance. Other: No free air or free fluid in the pelvis. No inguinal hernia is identified. Musculoskeletal: No destructive bone lesions. IMPRESSION: 1. Moderate diffuse scrotal skin swelling and edema with infiltration in the scrotal skin and pelvic soft tissues at the base of the penis. Changes are likely to represent cellulitis. No loculated collection to suggest abscess. No soft tissue gas. 2. Prominent groin and pelvic lymph nodes are likely reactive. Electronically Signed   By: Burman Nieves M.D.   On: 07/06/2020 03:26   DG Chest Port 1 View  Result Date: 07/06/2020 CLINICAL DATA:  Questionable sepsis. Abscess behind the scrotum for 3 days. Diabetes. EXAM: PORTABLE CHEST 1 VIEW COMPARISON:  06/29/2017 FINDINGS: The heart size and mediastinal contours are within normal limits. Both lungs are clear. The visualized skeletal structures are unremarkable. IMPRESSION: No active disease. Electronically Signed   By: Burman Nieves M.D.   On: 07/06/2020 00:12  Scheduled Meds: . atorvastatin  10 mg Oral Daily  . enoxaparin (LOVENOX) injection  80 mg Subcutaneous Q24H  . insulin aspart  0-15 Units Subcutaneous TID WC  . insulin aspart  0-5 Units Subcutaneous QHS  . insulin NPH Human  50 Units Subcutaneous Q breakfast  . pantoprazole  40 mg Oral BID   Continuous Infusions: . piperacillin-tazobactam (ZOSYN)  IV    . vancomycin    . vancomycin 2,000 mg (07/06/20 1975)     LOS: 0 days   Time spent:  Azucena Fallen, DO [facility]  If 7PM-7AM, please contact night-coverage [email or web address]  07/06/2020, 7:13 AM

## 2020-07-07 LAB — CBC
HCT: 43.3 % (ref 39.0–52.0)
Hemoglobin: 13.5 g/dL (ref 13.0–17.0)
MCH: 23.3 pg — ABNORMAL LOW (ref 26.0–34.0)
MCHC: 31.2 g/dL (ref 30.0–36.0)
MCV: 74.8 fL — ABNORMAL LOW (ref 80.0–100.0)
Platelets: 198 10*3/uL (ref 150–400)
RBC: 5.79 MIL/uL (ref 4.22–5.81)
RDW: 15 % (ref 11.5–15.5)
WBC: 6.6 10*3/uL (ref 4.0–10.5)
nRBC: 0 % (ref 0.0–0.2)

## 2020-07-07 LAB — BASIC METABOLIC PANEL
Anion gap: 9 (ref 5–15)
BUN: 13 mg/dL (ref 6–20)
CO2: 26 mmol/L (ref 22–32)
Calcium: 8.6 mg/dL — ABNORMAL LOW (ref 8.9–10.3)
Chloride: 98 mmol/L (ref 98–111)
Creatinine, Ser: 0.94 mg/dL (ref 0.61–1.24)
GFR, Estimated: >60 mL/min (ref >60)
Glucose, Bld: 224 mg/dL — ABNORMAL HIGH (ref 70–99)
Potassium: 4.5 mmol/L (ref 3.5–5.1)
Sodium: 133 mmol/L — ABNORMAL LOW (ref 135–145)

## 2020-07-07 LAB — URINE CULTURE

## 2020-07-07 LAB — GLUCOSE, CAPILLARY
Glucose-Capillary: 219 mg/dL — ABNORMAL HIGH (ref 70–99)
Glucose-Capillary: 284 mg/dL — ABNORMAL HIGH (ref 70–99)

## 2020-07-07 MED ORDER — DOXYCYCLINE HYCLATE 50 MG PO CAPS: 100.0000 mg | ORAL_CAPSULE | Freq: Two times a day (BID) | ORAL | 0 refills | Status: AC

## 2020-07-07 NOTE — Progress Notes (Signed)
Inpatient Diabetes Program Recommendations  AACE/ADA: New Consensus Statement on Inpatient Glycemic Control   Target Ranges:  Prepandial:   less than 140 mg/dL      Peak postprandial:   less than 180 mg/dL (1-2 hours)      Critically ill patients:  140 - 180 mg/dL   Results for BASTION, BOLGER (MRN [id number]) as of 07/07/2020 09:26  Ref. Range 07/06/2020 08:46 07/06/2020 12:00 07/06/2020 16:22 07/06/2020 21:51 07/07/2020 07:20  Glucose-Capillary Latest Ref Range: 70 - 99 mg/dL 096 (H) 283 (H) 662 (H) 234 (H) 219 (H)   Review of Glycemic Control  Diabetes history: DM2 Outpatient Diabetes medications: NPH 50 units BID, Glipizide XL 10 mg QAM Current orders for Inpatient glycemic control: NPH 50 units QAM, Novolog 0-15 units TID with meals, Novolog 0-5 units QHS  Inpatient Diabetes Program Recommendations:    Insulin: Please consider ordering Novolog 7 units TID with meals for meal coverage if patient eats at least 50% of meals.  Thanks, Orlando Penner, RN, MSN, CDE Diabetes Coordinator Inpatient Diabetes Program [phone] (Team Pager from 8am to 5pm)

## 2020-07-07 NOTE — Discharge Summary (Signed)
Physician Discharge Summary  Caleb [name] IZT:[id number] DOB: Dec 22, 1980 DOA: 07/05/2020  PCP: Biagio Borg, MD  Admit date: 07/05/2020 Discharge date: 07/07/2020  Admitted From: Home Disposition: Home  Recommendations for Outpatient Follow-up:  1. Follow up with PCP in 1-2 weeks 2. Please obtain BMP/CBC in one week   Discharge Condition: Stable CODE STATUS: Full Diet recommendation: Diabetic diet  Brief/Interim Summary: Caleb T Chandleris a 39 y.o.malewith medical history significant ofhypertension, GERD, morbid obesity, uncontrolled insulin-dependent type 2 diabetes, sleep apnea presenting to the EDfor evaluation of a scrotal abscess. Patient states his symptoms began about a week ago but the problem has been worse for the past 3 days. It started out as a small area of infection that has continued to spread. States yesterday the area started to drain large amounts of pus and blood. He has had pain in this area. He has not checked his temperature but had chills yesterday. No other complaints. Denies cough, shortness of breath, chest pain, nausea, vomiting, abdominal pain, or diarrhea.  Patient admitted as above with acute cellulitis and questionable abscess of the scrotum, patient resolved quite rapidly on IV antibiotics, pain erythema and swelling drastically resolved.  Patient does have drainage from spontaneous rupture of likely small abscess.  Imaging was unremarkable for any diffuse infiltrative infection empirically on cellulitic.  At this time given patient's rapid improvement, normal labs and otherwise remains afebrile will transition to p.o. doxycycline for the end of treatment.  Close follow-up with PCP in the next 3 to 5 days for follow-up and possible extension of time off from work.  Patient otherwise stable and agreeable for discharge home, he has at bedside agrees.   Discharge Diagnoses:  Principal Problem:   Cellulitis of scrotum Active Problems:    Essential hypertension   GERD (gastroesophageal reflux disease)   Sepsis (HCC)   Type 2 diabetes mellitus [name])    Discharge Instructions  Discharge Instructions    Call MD for:  redness, tenderness, or signs of infection (pain, swelling, redness, odor or green/yellow discharge around incision site)   Complete by: As directed    Call MD for:  severe uncontrolled pain   Complete by: As directed    Call MD for:  temperature >100.4   Complete by: As directed    Diet - low sodium heart healthy   Complete by: As directed    Increase activity slowly   Complete by: As directed      Allergies as of 07/07/2020      Reactions   Metformin And Related Diarrhea      Medication List    STOP taking these medications   cyclobenzaprine 5 MG tablet Commonly known as: FLEXERIL   dicyclomine 20 MG tablet Commonly known as: BENTYL     TAKE these medications   Accu-Chek Guide test strip Generic drug: glucose blood Use to check blood sugars twice a day   amLODipine 5 MG tablet Commonly known as: NORVASC Take 1 tablet (5 mg total) by mouth daily.   atorvastatin 10 MG tablet Commonly known as: LIPITOR Take 1 tablet by mouth once daily   doxycycline 50 MG capsule Commonly known as: VIBRAMYCIN Take 2 capsules (100 mg total) by mouth 2 (two) times daily for 10 days.   glipiZIDE 10 MG 24 hr tablet Commonly known as: GLUCOTROL XL Take 1 tablet (10 mg total) by mouth daily with breakfast.   ibuprofen 800 MG tablet Commonly known as: ADVIL Take 1 tablet (800 mg total) by  mouth every 8 (eight) hours as needed for moderate pain.   insulin NPH Human 100 UNIT/ML injection Commonly known as: NOVOLIN N Inject 0.5 mLs (50 Units total) into the skin every morning. And syringes 1/day   Lancets Misc Use as directed once daily E11.9   OneTouch Verio w/Device Kit Use as directed once daily E11.9   pantoprazole 40 MG tablet Commonly known as: PROTONIX Take 1 tablet (40 mg total) by mouth  2 (two) times daily.   tadalafil 20 MG tablet Commonly known as: CIALIS Take 0.5-1 tablets (10-20 mg total) by mouth every other day as needed for erectile dysfunction.       Allergies  Allergen Reactions  . Metformin And Related Diarrhea    Consultations:  None   Procedures/Studies: CT PELVIS W CONTRAST  Result Date: 07/06/2020 CLINICAL DATA:  Scrotal swelling or edema. Diabetes. Abscess to the posterior scrotum for 3 days, now ruptured and draining. EXAM: CT PELVIS WITH CONTRAST TECHNIQUE: Multidetector CT imaging of the pelvis was performed using the standard protocol following the bolus administration of intravenous contrast. CONTRAST:  185m OMNIPAQUE IOHEXOL 300 MG/ML  SOLN COMPARISON:  None. FINDINGS: Urinary Tract: No bladder wall thickening or filling defect. Distal ureters are decompressed. Bowel: Visualized portions of small and large bowel are normal in caliber. No wall thickening or inflammatory changes identified. Vascular/Lymphatic: No evidence of pelvic aneurysm. Prominent groin lymph nodes measuring up to 1.6 cm bilaterally. Nonspecific but likely reactive. Scattered pelvic lymph nodes are not pathologically enlarged, likely reactive. Reproductive: Prostate gland is normal in size. Moderate diffuse scrotal skin swelling and edema with infiltration in the scrotal skin and pelvic soft tissues at the base of the penis. Changes are likely to represent cellulitis. No loculated collection to suggest abscess. No soft tissue gas identified. Testicles are normal in size with symmetrical appearance. Other: No free air or free fluid in the pelvis. No inguinal hernia is identified. Musculoskeletal: No destructive bone lesions. IMPRESSION: 1. Moderate diffuse scrotal skin swelling and edema with infiltration in the scrotal skin and pelvic soft tissues at the base of the penis. Changes are likely to represent cellulitis. No loculated collection to suggest abscess. No soft tissue gas. 2.  Prominent groin and pelvic lymph nodes are likely reactive. Electronically Signed   By: WLucienne CapersM.D.   On: 07/06/2020 03:26   DG Chest Port 1 View  Result Date: 07/06/2020 CLINICAL DATA:  Questionable sepsis. Abscess behind the scrotum for 3 days. Diabetes. EXAM: PORTABLE CHEST 1 VIEW COMPARISON:  06/29/2017 FINDINGS: The heart size and mediastinal contours are within normal limits. Both lungs are clear. The visualized skeletal structures are unremarkable. IMPRESSION: No active disease. Electronically Signed   By: WLucienne CapersM.D.   On: 07/06/2020 00:12     Subjective: No acute issues or events overnight denies nausea vomiting diarrhea constipation no fever chills   Discharge Exam: Vitals:   07/06/20 2149 07/07/20 0623  BP: (!) 127/59 120/68  Pulse: 90 72  Resp: 18 18  Temp: 97.8 F (36.6 C) 97.7 F (36.5 C)  SpO2: 98% 100%   Vitals:   07/06/20 1100 07/06/20 1505 07/06/20 2149 07/07/20 0623  BP: (!) 144/82 (!) 141/71 (!) 127/59 120/68  Pulse: 85 94 90 72  Resp: (!) '22 16 18 18  ' Temp: 98.1 F (36.7 C) 98.6 F (37 C) 97.8 F (36.6 C) 97.7 F (36.5 C)  TempSrc:  Oral Oral Oral  SpO2: 99% 100% 98% 100%  Weight:  Height:        General: Pt is alert, awake, not in acute distress Cardiovascular: RRR, S1/S2 +, no rubs, no gallops Respiratory: CTA bilaterally, no wheezing, no rhonchi Abdominal: Soft, NT, ND, bowel sounds + Extremities: no edema, no cyanosis Skin: Small 2 x 2 centimeter fluctuant area posterior scrotum draining purulent material    The results of significant diagnostics from this hospitalization (including imaging, microbiology, ancillary and laboratory) are listed below for reference.     Microbiology: Recent Results (from the past 240 hour(s))  Urine culture     Status: Abnormal   Collection Time: 07/06/20 12:51 AM   Specimen: In/Out Cath Urine  Result Value Ref Range Status   Specimen Description   Final    IN/OUT CATH  URINE Performed at [facility], [place] [address], [place], [place] [address]    Special Requests   Final    NONE Performed at [facility], [place] [address], [place], [place] [address]    Culture MULTIPLE SPECIES PRESENT, SUGGEST RECOLLECTION (A)  Final   Report Status 07/07/2020 FINAL  Final  Resp Panel by RT-PCR (Flu A&B, Covid) Nasopharyngeal Swab     Status: None   Collection Time: 07/06/20 12:51 AM   Specimen: Nasopharyngeal Swab; Nasopharyngeal(NP) swabs in vial transport medium  Result Value Ref Range Status   SARS Coronavirus 2 by RT PCR NEGATIVE NEGATIVE Final    Comment: (NOTE) SARS-CoV-2 target nucleic acids are NOT DETECTED.  The SARS-CoV-2 RNA is generally detectable in upper respiratory specimens during the acute phase of infection. The lowest concentration of SARS-CoV-2 viral copies this assay can detect is 138 copies/mL. A negative result does not preclude SARS-Cov-2 infection and should not be used as the sole basis for treatment or other patient management decisions. A negative result may occur with  improper specimen collection/handling, submission of specimen other than nasopharyngeal swab, presence of viral mutation(s) within the areas targeted by this assay, and inadequate number of viral copies(<138 copies/mL). A negative result must be combined with clinical observations, patient history, and epidemiological information. The expected result is Negative.  Fact Sheet for Patients:  EntrepreneurPulse.com.au  Fact Sheet for Healthcare Providers:  IncredibleEmployment.be  This test is no t yet approved or cleared by the [place] FDA and  has been authorized for detection and/or diagnosis of SARS-CoV-2 by FDA under an Emergency Use Authorization (EUA). This EUA will remain  in effect (meaning this test can be used) for the duration of the COVID-19 declaration under Section 564(b)(1) of  the Act, 21 U.S.C.section 360bbb-3(b)(1), unless the authorization is terminated  or revoked sooner.       Influenza A by PCR NEGATIVE NEGATIVE Final   Influenza B by PCR NEGATIVE NEGATIVE Final    Comment: (NOTE) The Xpert Xpress SARS-CoV-2/FLU/RSV plus assay is intended as an aid in the diagnosis of influenza from Nasopharyngeal swab specimens and should not be used as a sole basis for treatment. Nasal washings and aspirates are unacceptable for Xpert Xpress SARS-CoV-2/FLU/RSV testing.  Fact Sheet for Patients: EntrepreneurPulse.com.au  Fact Sheet for Healthcare Providers: IncredibleEmployment.be  This test is not yet approved or cleared by the [place] FDA and has been authorized for detection and/or diagnosis of SARS-CoV-2 by FDA under an Emergency Use Authorization (EUA). This EUA will remain in effect (meaning this test can be used) for the duration of the COVID-19 declaration under Section 564(b)(1) of the Act, 21 U.S.C. section 360bbb-3(b)(1), unless the authorization is terminated or  revoked.  Performed at [facility], [place] [address], [place], [place] [address]      Labs: BNP (last 3 results) No results for input(s): BNP in the last 8760 hours. Basic Metabolic Panel: Recent Labs  Lab 07/06/20 0025 07/07/20 0539  NA 134* 133*  K 4.1 4.5  CL 98 98  CO2 25 26  GLUCOSE 211* 224*  BUN 15 13  CREATININE 0.98 0.94  CALCIUM 9.2 8.6*   Liver Function Tests: Recent Labs  Lab 07/06/20 0025  AST 20  ALT 23  ALKPHOS 48  BILITOT 0.8  PROT 7.9  ALBUMIN 4.3   No results for input(s): LIPASE, AMYLASE in the last 168 hours. No results for input(s): AMMONIA in the last 168 hours. CBC: Recent Labs  Lab 07/06/20 0025 07/07/20 0539  WBC 11.1* 6.6  NEUTROABS 7.3  --   HGB 13.5 13.5  HCT 42.2 43.3  MCV 72.6* 74.8*  PLT 223 198   Cardiac Enzymes: No results for input(s): CKTOTAL, CKMB, CKMBINDEX,  TROPONINI in the last 168 hours. BNP: Invalid input(s): POCBNP CBG: Recent Labs  Lab 07/06/20 1200 07/06/20 1622 07/06/20 2151 07/07/20 0720 07/07/20 1158  GLUCAP 225* 242* 234* 219* 284*   D-Dimer No results for input(s): DDIMER in the last 72 hours. Hgb A1c Recent Labs    07/06/20 0731  HGBA1C 9.9*   Lipid Profile No results for input(s): CHOL, HDL, LDLCALC, TRIG, CHOLHDL, LDLDIRECT in the last 72 hours. Thyroid function studies No results for input(s): TSH, T4TOTAL, T3FREE, THYROIDAB in the last 72 hours.  Invalid input(s): FREET3 Anemia work up No results for input(s): VITAMINB12, FOLATE, FERRITIN, TIBC, IRON, RETICCTPCT in the last 72 hours. Urinalysis    Component Value Date/Time   COLORURINE YELLOW 07/06/2020 0051   APPEARANCEUR CLEAR 07/06/2020 0051   LABSPEC 1.029 07/06/2020 0051   PHURINE 5.0 07/06/2020 0051   GLUCOSEU NEGATIVE 07/06/2020 0051   GLUCOSEU >=1000 (A) 12/20/2019 1614   HGBUR NEGATIVE 07/06/2020 0051   BILIRUBINUR NEGATIVE 07/06/2020 0051   KETONESUR 5 (A) 07/06/2020 0051   PROTEINUR NEGATIVE 07/06/2020 0051   UROBILINOGEN 0.2 12/20/2019 1614   NITRITE NEGATIVE 07/06/2020 0051   LEUKOCYTESUR NEGATIVE 07/06/2020 0051   Sepsis Labs Invalid input(s): PROCALCITONIN,  WBC,  LACTICIDVEN Microbiology Recent Results (from the past 240 hour(s))  Urine culture     Status: Abnormal   Collection Time: 07/06/20 12:51 AM   Specimen: In/Out Cath Urine  Result Value Ref Range Status   Specimen Description   Final    IN/OUT CATH URINE Performed at [facility], [place] [address], [place], [place] [address]    Special Requests   Final    NONE Performed at [facility], [place] [address], [place], [place] [address]    Culture MULTIPLE SPECIES PRESENT, SUGGEST RECOLLECTION (A)  Final   Report Status 07/07/2020 FINAL  Final  Resp Panel by RT-PCR (Flu A&B, Covid) Nasopharyngeal Swab     Status: None   Collection Time:  07/06/20 12:51 AM   Specimen: Nasopharyngeal Swab; Nasopharyngeal(NP) swabs in vial transport medium  Result Value Ref Range Status   SARS Coronavirus 2 by RT PCR NEGATIVE NEGATIVE Final    Comment: (NOTE) SARS-CoV-2 target nucleic acids are NOT DETECTED.  The SARS-CoV-2 RNA is generally detectable in upper respiratory specimens during the acute phase of infection. The lowest concentration of SARS-CoV-2 viral copies this assay can detect is 138 copies/mL. A negative result does not preclude SARS-Cov-2 infection and  should not be used as the sole basis for treatment or other patient management decisions. A negative result may occur with  improper specimen collection/handling, submission of specimen other than nasopharyngeal swab, presence of viral mutation(s) within the areas targeted by this assay, and inadequate number of viral copies(<138 copies/mL). A negative result must be combined with clinical observations, patient history, and epidemiological information. The expected result is Negative.  Fact Sheet for Patients:  EntrepreneurPulse.com.au  Fact Sheet for Healthcare Providers:  IncredibleEmployment.be  This test is no t yet approved or cleared by the [place] FDA and  has been authorized for detection and/or diagnosis of SARS-CoV-2 by FDA under an Emergency Use Authorization (EUA). This EUA will remain  in effect (meaning this test can be used) for the duration of the COVID-19 declaration under Section 564(b)(1) of the Act, 21 U.S.C.section 360bbb-3(b)(1), unless the authorization is terminated  or revoked sooner.       Influenza A by PCR NEGATIVE NEGATIVE Final   Influenza B by PCR NEGATIVE NEGATIVE Final    Comment: (NOTE) The Xpert Xpress SARS-CoV-2/FLU/RSV plus assay is intended as an aid in the diagnosis of influenza from Nasopharyngeal swab specimens and should not be used as a sole basis for treatment. Nasal washings  and aspirates are unacceptable for Xpert Xpress SARS-CoV-2/FLU/RSV testing.  Fact Sheet for Patients: EntrepreneurPulse.com.au  Fact Sheet for Healthcare Providers: IncredibleEmployment.be  This test is not yet approved or cleared by the [place] FDA and has been authorized for detection and/or diagnosis of SARS-CoV-2 by FDA under an Emergency Use Authorization (EUA). This EUA will remain in effect (meaning this test can be used) for the duration of the COVID-19 declaration under Section 564(b)(1) of the Act, 21 U.S.C. section 360bbb-3(b)(1), unless the authorization is terminated or revoked.  Performed at [facility], [place] [address] [place], [place] [address]      Time coordinating discharge: Over 30 minutes  SIGNED:   Little Ishikawa, DO [facility] 07/07/2020, 12:20 PM Pager   If 7PM-7AM, please contact night-coverage [email or web address]

## 2020-07-07 NOTE — Progress Notes (Signed)
Pharmacy Antibiotic Note  Caleb [name] is a 39 y.o. male presented to the ED on 07/05/2020 with scrotal abscess. CT pelvis showed diffused scrotal skin swelling and edema consistent with cellulitis and no abscess noted.  He's currently on vancomycin and zosyn for infection.  Today, 07/07/2020: - day #2 abx - afeb, wbc wnl - scr stable (crcl >100) - all cultures have been negative thus far   Plan: - Of note, vancomycin dose should be 1250 mg IV q8h. Continue current dose for now. Pharmacy will plan on checking level in the next couple of days if to continue with abx - continue zosyn 3.375 gm IV q8h (infuse over 4 hrs)  _____________________________________________  Height: 5\' 10"  (177.8 cm) Weight: (!) 178.7 kg (393 lb 15.4 oz) IBW/kg (Calculated) : 73  Temp (24hrs), Avg:98.1 F (36.7 C), Min:97.7 F (36.5 C), Max:98.6 F (37 C)  Recent Labs  Lab 07/06/20 0022 07/06/20 0025 07/07/20 0539  WBC  --  11.1* 6.6  CREATININE  --  0.98 0.94  LATICACIDVEN 1.0  --   --     Estimated Creatinine Clearance: 177.2 mL/min (by C-G formula based on SCr of 0.94 mg/dL).    Allergies  Allergen Reactions  . Metformin And Related Diarrhea    Antimicrobials this admission: 11/25 Vancomycin >>  11/25 Zosyn >>   Microbiology results: 11/25 BCx x1:  11/25 UCx:  multi species, suggest recollection FINAL 11/25 Resp PCR: negative for COVID/influenza  Thank you for allowing pharmacy to be a part of this patient's care.  12/25 07/07/2020 9:09 AM

## 2020-07-11 LAB — CULTURE, BLOOD (SINGLE)
Culture: NO GROWTH
Special Requests: ADEQUATE

## 2020-08-16 ENCOUNTER — Telehealth: Payer: No Typology Code available for payment source | Admitting: Physician Assistant

## 2020-08-16 DIAGNOSIS — R059 Cough, unspecified: Secondary | ICD-10-CM | POA: Diagnosis not present

## 2020-08-16 DIAGNOSIS — U071 COVID-19: Secondary | ICD-10-CM | POA: Diagnosis not present

## 2020-08-16 LAB — POC COVID19 BINAXNOW: SARS Coronavirus 2 Ag: POSITIVE — AB

## 2020-08-16 MED ORDER — GUAIFENESIN ER 600 MG PO TB12: 600.0000 mg | ORAL_TABLET | Freq: Two times a day (BID) | ORAL | 0 refills | Status: DC

## 2020-08-16 NOTE — Patient Instructions (Signed)
Your rapid Covid test was positive.  Given that your symptoms began on 08/11/2020, your quarantine period  Should continue until August 20, 2020.  Please continue using over-the-counter cold remedies, making sure that they are compatible with your diagnosis of hypertension.  I sent a prescription of Mucinex to your pharmacy to help you with your cough.  We have set up a virtual appointment for [place] to follow-up with you on Monday, August 21, 2020 at 10:30 AM.  I hope that you feel better soon  Roney Jaffe, PA-C Physician Assistant [facility] [email or web address]   10 Things You Can Do to Manage Your COVID-19 Symptoms at Home If you have possible or confirmed COVID-19: 1. Stay home from work and school. And stay away from other public places. If you must go out, avoid using any kind of public transportation, ridesharing, or taxis. 2. Monitor your symptoms carefully. If your symptoms get worse, call your healthcare provider immediately. 3. Get rest and stay hydrated. 4. If you have a medical appointment, call the healthcare provider ahead of time and tell them that you have or may have COVID-19. 5. For medical emergencies, call 911 and notify the dispatch personnel that you have or may have COVID-19. 6. Cover your cough and sneezes with a tissue or use the inside of your elbow. 7. Wash your hands often with soap and water for at least 20 seconds or clean your hands with an alcohol-based hand sanitizer that contains at least 60% alcohol. 8. As much as possible, stay in a specific room and away from other people in your home. Also, you should use a separate bathroom, if available. If you need to be around other people in or outside of the home, wear a mask. 9. Avoid sharing personal items with other people in your household, like dishes, towels, and bedding. 10. Clean all surfaces that are touched often, like counters, tabletops, and  doorknobs. Use household cleaning sprays or wipes according to the label instructions. [email or web address] 02/10/2019 This information is not intended to replace advice given to you by your health care provider. Make sure you discuss any questions you have with your health care provider. Document Revised: 07/15/2019 Document Reviewed: 07/15/2019 Elsevier Patient Education  2020 Elsevier Inc.     Person Under Monitoring Name: Caleb [name]  Location: [address] [place] [address]   Infection Prevention Recommendations for Individuals Confirmed to have, or Being Evaluated for, 2019 Novel Coronavirus (COVID-19) Infection Who Receive Care at Home  Individuals who are confirmed to have, or are being evaluated for, COVID-19 should follow the prevention steps below until a healthcare provider or local or state health department says they can return to normal activities.  Stay home except to get medical care You should restrict activities outside your home, except for getting medical care. Do not go to work, school, or public areas, and do not use public transportation or taxis.  Call ahead before visiting your doctor Before your medical appointment, call the healthcare provider and tell them that you have, or are being evaluated for, COVID-19 infection. This will help the healthcare provider's office take steps to keep other people from getting infected. Ask your healthcare provider to call the local or state health department.  Monitor your symptoms Seek prompt medical attention if your illness is worsening (e.g., difficulty breathing). Before going to your medical appointment, call the healthcare provider and tell them that you have, or are being evaluated for, COVID-19 infection.  Ask your healthcare provider to call the local or state health department.  Wear a facemask You should wear a facemask that covers your nose and mouth when you are in the same room  with other people and when you visit a healthcare provider. People who live with or visit you should also wear a facemask while they are in the same room with you.  Separate yourself from other people in your home As much as possible, you should stay in a different room from other people in your home. Also, you should use a separate bathroom, if available.  Avoid sharing household items You should not share dishes, drinking glasses, cups, eating utensils, towels, bedding, or other items with other people in your home. After using these items, you should wash them thoroughly with soap and water.  Cover your coughs and sneezes Cover your mouth and nose with a tissue when you cough or sneeze, or you can cough or sneeze into your sleeve. Throw used tissues in a lined trash can, and immediately wash your hands with soap and water for at least 20 seconds or use an alcohol-based hand rub.  Wash your [employer] your hands often and thoroughly with soap and water for at least 20 seconds. You can use an alcohol-based hand sanitizer if soap and water are not available and if your hands are not visibly dirty. Avoid touching your eyes, nose, and mouth with unwashed hands.   Prevention Steps for Caregivers and Household Members of Individuals Confirmed to have, or Being Evaluated for, COVID-19 Infection Being Cared for in the Home  If you live with, or provide care at home for, a person confirmed to have, or being evaluated for, COVID-19 infection please follow these guidelines to prevent infection:  Follow healthcare provider's instructions Make sure that you understand and can help the patient follow any healthcare provider instructions for all care.  Provide for the patient's basic needs You should help the patient with basic needs in the home and provide support for getting groceries, prescriptions, and other personal needs.  Monitor the patient's symptoms If they are getting sicker, call  his or her medical provider and tell them that the patient has, or is being evaluated for, COVID-19 infection. This will help the healthcare provider's office take steps to keep other people from getting infected. Ask the healthcare provider to call the local or state health department.  Limit the number of people who have contact with the patient  If possible, have only one caregiver for the patient.  Other household members should stay in another home or place of residence. If this is not possible, they should stay  in another room, or be separated from the patient as much as possible. Use a separate bathroom, if available.  Restrict visitors who do not have an essential need to be in the home.  Keep older adults, very young children, and other sick people away from the patient Keep older adults, very young children, and those who have compromised immune systems or chronic health conditions away from the patient. This includes people with chronic heart, lung, or kidney conditions, diabetes, and cancer.  Ensure good ventilation Make sure that shared spaces in the home have good air flow, such as from an air conditioner or an opened window, weather permitting.  Wash your hands often  Wash your hands often and thoroughly with soap and water for at least 20 seconds. You can use an alcohol based hand sanitizer if soap and  water are not available and if your hands are not visibly dirty.  Avoid touching your eyes, nose, and mouth with unwashed hands.  Use disposable paper towels to dry your hands. If not available, use dedicated cloth towels and replace them when they become wet.  Wear a facemask and gloves  Wear a disposable facemask at all times in the room and gloves when you touch or have contact with the patient's blood, body fluids, and/or secretions or excretions, such as sweat, saliva, sputum, nasal mucus, vomit, urine, or feces.  Ensure the mask fits over your nose and mouth  tightly, and do not touch it during use.  Throw out disposable facemasks and gloves after using them. Do not reuse.  Wash your hands immediately after removing your facemask and gloves.  If your personal clothing becomes contaminated, carefully remove clothing and launder. Wash your hands after handling contaminated clothing.  Place all used disposable facemasks, gloves, and other waste in a lined container before disposing them with other household waste.  Remove gloves and wash your hands immediately after handling these items.  Do not share dishes, glasses, or other household items with the patient  Avoid sharing household items. You should not share dishes, drinking glasses, cups, eating utensils, towels, bedding, or other items with a patient who is confirmed to have, or being evaluated for, COVID-19 infection.  After the person uses these items, you should wash them thoroughly with soap and water.  Wash laundry thoroughly  Immediately remove and wash clothes or bedding that have blood, body fluids, and/or secretions or excretions, such as sweat, saliva, sputum, nasal mucus, vomit, urine, or feces, on them.  Wear gloves when handling laundry from the patient.  Read and follow directions on labels of laundry or clothing items and detergent. In general, wash and dry with the warmest temperatures recommended on the label.  Clean all areas the individual has used often  Clean all touchable surfaces, such as counters, tabletops, doorknobs, bathroom fixtures, toilets, phones, keyboards, tablets, and bedside tables, every day. Also, clean any surfaces that may have blood, body fluids, and/or secretions or excretions on them.  Wear gloves when cleaning surfaces the patient has come in contact with.  Use a diluted bleach solution (e.g., dilute bleach with 1 part bleach and 10 parts water) or a household disinfectant with a label that says EPA-registered for coronaviruses. To make a bleach  solution at home, add 1 tablespoon of bleach to 1 quart (4 cups) of water. For a larger supply, add  cup of bleach to 1 gallon (16 cups) of water.  Read labels of cleaning products and follow recommendations provided on product labels. Labels contain instructions for safe and effective use of the cleaning product including precautions you should take when applying the product, such as wearing gloves or eye protection and making sure you have good ventilation during use of the product.  Remove gloves and wash hands immediately after cleaning.  Monitor yourself for signs and symptoms of illness Caregivers and household members are considered close contacts, should monitor their health, and will be asked to limit movement outside of the home to the extent possible. Follow the monitoring steps for close contacts listed on the symptom monitoring form.   ? If you have additional questions, contact your local health department or call the epidemiologist on call at [phone] (available 24/7). ? This guidance is subject to change. For the most up-to-date guidance from [facility], please refer to their website: [email or web address]

## 2020-08-16 NOTE — Progress Notes (Signed)
Patient verified DOB Patient complains of symptoms beginning last Friday.  Patient complains of cough, sore throat, intermittent. Patient denies N/V diarrhea. Patient denies HA with 1 day of body aches. Patient shares his fiance tested positive this past Saturday. Patient has used cough drops.

## 2020-08-16 NOTE — Progress Notes (Signed)
New Patient Office Visit  Subjective:  Patient ID: Caleb [name], male    DOB: 09/28/1980  Age: 40 y.o. MRN: [id number]  CC: No chief complaint on file.  Virtual Visit via Telephone Note  I connected with Caleb [name] on 08/16/20 at  2:40 PM EST by telephone and verified that I am speaking with the correct person using two identifiers.  Location: Patient: Car Provider: [facility]    I discussed the limitations, risks, security and privacy concerns of performing an evaluation and management service by telephone and the availability of in person appointments. I also discussed with the patient that there may be a patient responsible charge related to this service. The patient expressed understanding and agreed to proceed.   History of Present Illness: States that he has been having a sore throat, a productive cough with dark yellow sputum since 08/11/20 endorses 1 day of body aches, but states that those have resolved.Eating and drinking okay.  Reports that he has been using cough drops, DayQuil, ginger tea and lemon tea with some relief.  Fiance and son tested positive for Covid 2 days ago.  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 Mild enlargement of pulmonary artery by CT 11/01/13.   GERD (gastroesophageal reflux disease)    HTN (hypertension)    Hx of echocardiogram    Echo (12/14/13):  Mod LVH, EF 50-55%, no RWMA, trivial Eff.   LV dysfunction    a. 10/2013:  EF 45-50% with followup echo 55-60%.   Morbid obesity ([place])    Pericarditis    a. 10/2013: Acute pericarditis with cardiac tamponade s/p pericardiocentesis.   Sleep apnea    Type 2 diabetes mellitus without complication, without  long-term current use of insulin ([place]) 01/02/2017    Past Surgical History:  Procedure Laterality Date   fluid drained from heart     PERICARDIAL TAP N/A 11/02/2013   Procedure: PERICARDIAL TAP;  Surgeon: Troy Sine, MD;  Location: [facility] CATH LAB;  Service: Cardiovascular;  Laterality: N/A;    Family History  Problem Relation Age of Onset   Heart failure Father    Heart attack Father    Kidney cancer Father    Diabetes Father    Pancreatic cancer Paternal Grandmother    Hypertension Mother    Healthy Maternal Grandmother    Healthy Maternal Grandfather    Healthy Paternal Grandfather    CAD Other        Aunt with CABG   Stroke Neg Hx     Social History   Socioeconomic History   Marital status: Single    Spouse name: Not on file   Number of children: 2   Years of education: 12   Highest education level: Not on file  Occupational History   Occupation: delivery driver    Employer: [employer]  Tobacco Use   Smoking status: Current Some Day Smoker    Packs/day: 0.00    Years: 3.00    Pack years: 0.00    Types: Cigars, Cigarettes   Smokeless tobacco: Never Used   Tobacco comment: 1 cigar daily  Vaping Use   Vaping Use: Never used  Substance and Sexual Activity   Alcohol use:  Yes    Comment: occasional   Drug use: No   Sexual activity: Not on file  Other Topics Concern   Not on file  Social History Narrative   Fun: [employer] out with the kids   Denies religious beliefs effecting health care.    Social Determinants of Health   Financial Resource Strain: Not on file  Food Insecurity: Not on file  Transportation Needs: Not on file  Physical Activity: Not on file  Stress: Not on file  Social Connections: Not on file  Intimate Partner Violence: Not on file    ROS Review of Systems  Constitutional: Negative for chills, fatigue and fever.  HENT: Positive for congestion and sore throat. Negative for sinus pressure and trouble swallowing.    Eyes: Negative.   Respiratory: Positive for cough. Negative for shortness of breath and wheezing.   Cardiovascular: Negative for chest pain.  Gastrointestinal: Negative for abdominal pain, diarrhea, nausea and vomiting.  Endocrine: Negative.   Genitourinary: Negative.   Musculoskeletal: Negative for myalgias.  Skin: Negative.   Allergic/Immunologic: Negative.   Neurological: Negative for headaches.  Hematological: Negative.   Psychiatric/Behavioral: Negative.     Objective:   Today's Vitals: There were no vitals taken for this visit.    Assessment & Plan:   Problem List Items Addressed This Visit   None   Visit Diagnoses    Cough    -  Primary   Relevant Orders   POC COVID-19      Outpatient Encounter Medications as of 08/16/2020  Medication Sig   amLODipine (NORVASC) 5 MG tablet Take 1 tablet (5 mg total) by mouth daily.   atorvastatin (LIPITOR) 10 MG tablet Take 1 tablet by mouth once daily   Blood Glucose Monitoring Suppl (ONETOUCH VERIO) w/Device KIT Use as directed once daily E11.9   glipiZIDE (GLUCOTROL XL) 10 MG 24 hr tablet Take 1 tablet (10 mg total) by mouth daily with breakfast.   glucose blood (ACCU-CHEK GUIDE) test strip Use to check blood sugars twice a day   ibuprofen (ADVIL) 800 MG tablet Take 1 tablet (800 mg total) by mouth every 8 (eight) hours as needed for moderate pain.   insulin NPH Human (NOVOLIN N) 100 UNIT/ML injection Inject 0.5 mLs (50 Units total) into the skin every morning. And syringes 1/day   Lancets MISC Use as directed once daily E11.9   pantoprazole (PROTONIX) 40 MG tablet Take 1 tablet (40 mg total) by mouth 2 (two) times daily.   tadalafil (CIALIS) 20 MG tablet Take 0.5-1 tablets (10-20 mg total) by mouth every other day as needed for erectile dysfunction.   No facility-administered encounter medications on file as of 08/16/2020.   Assessment and Plan: 1. COVID-19 Patient evaluated, tested and sent home with instructions  for home care and Quarantine. Instructed to seek further care if symptoms worsen.  Is set up with follow up for a virtual visit with mobile medicine unit on August 21, 2020.  - guaiFENesin (MUCINEX) 600 MG 12 hr tablet; Take 1 tablet (600 mg total) by mouth 2 (two) times daily.  Dispense: 60 tablet; Refill: 0  2. Cough Patient education given on medications compatible with diagnosis of hypertension - POC COVID-19 - guaiFENesin (MUCINEX) 600 MG 12 hr tablet; Take 1 tablet (600 mg total) by mouth 2 (two) times daily.  Dispense: 60 tablet; Refill: 0   Follow Up Instructions:    I discussed the assessment and treatment plan with the patient. The patient was provided an  opportunity to ask questions and all were answered. The patient agreed with the plan and demonstrated an understanding of the instructions.   The patient was advised to call back or seek an in-person evaluation if the symptoms worsen or if the condition fails to improve as anticipated.  I provided 21 minutes of non-face-to-face time during this encounter.      Follow-up: No follow-ups on file.   Loraine Grip Mayers, PA-C

## 2020-08-21 ENCOUNTER — Telehealth: Payer: Self-pay | Admitting: Physician Assistant

## 2020-08-21 DIAGNOSIS — Z8616 Personal history of COVID-19: Secondary | ICD-10-CM | POA: Insufficient documentation

## 2020-08-21 NOTE — Progress Notes (Signed)
Established Patient Office Visit  Subjective:  Patient ID: Caleb [name], male    DOB: [birth year]  Age: 40 y.o. MRN: [id number]  CC:  Chief Complaint  Patient presents with  . Covid Positive   Virtual Visit via Telephone Note  I connected with Leonette Most on 08/21/20 at 10:40 AM EST by telephone and verified that I am speaking with the correct person using two identifiers.  Location: Patient: Home Provider: [facility]    I discussed the limitations, risks, security and privacy concerns of performing an evaluation and management service by telephone and the availability of in person appointments. I also discussed with the patient that there may be a patient responsible charge related to this service. The patient expressed understanding and agreed to proceed.   History of Present Illness:   Caleb [name] reports that he continues to have a cough, states that it is mostly a dry cough but will occasionally have yellow sputum.  Reports that he does feel it is improving, states that he continues to use Mucinex and cough drops with relief.  Reports all other Covid related symptoms have since resolved.  No new concerns at this time.   Observations/Objective: Medical history and current medications reviewed, no physical exam completed    Past Medical History:  Diagnosis Date  . Cardiac tamponade 10/2013  . 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 Mild enlargement of pulmonary artery by CT 11/01/13.  Marland Kitchen GERD (gastroesophageal reflux disease)   . HTN (hypertension)   . Hx of echocardiogram    Echo (12/14/13):  Mod LVH, EF 50-55%, no RWMA, trivial Eff.  . LV dysfunction    a. 10/2013:  EF 45-50% with followup echo 55-60%.  . Morbid obesity ([place])   . Pericarditis    a. 10/2013: Acute pericarditis with cardiac tamponade s/p pericardiocentesis.  . Sleep apnea   . Type 2 diabetes mellitus without complication, without long-term current use of insulin ([place])  01/02/2017    Past Surgical History:  Procedure Laterality Date  . fluid drained from heart    . PERICARDIAL TAP N/A 11/02/2013   Procedure: PERICARDIAL TAP;  Surgeon: Troy Sine, MD;  Location: [facility] CATH LAB;  Service: Cardiovascular;  Laterality: N/A;    Family History  Problem Relation Age of Onset  . Heart failure Father   . Heart attack Father   . Kidney cancer Father   . Diabetes Father   . Pancreatic cancer Paternal Grandmother   . Hypertension Mother   . Healthy Maternal Grandmother   . Healthy Maternal Grandfather   . Healthy Paternal Grandfather   . CAD Other        Aunt with CABG  . Stroke Neg Hx     Social History   Socioeconomic History  . Marital status: Single    Spouse name: Not on file  . Number of children: 2  . Years of education: 47  . Highest education level: Not on file  Occupational History  . Occupation: delivery Education administrator: [employer]  Tobacco Use  . Smoking status: Current Some Day Smoker    Packs/day: 0.00    Years: 3.00    Pack years: 0.00    Types: Cigars, Cigarettes  . Smokeless tobacco: Never Used  . Tobacco comment: 1 cigar daily  Vaping Use  . Vaping Use: Never used  Substance and Sexual Activity  . Alcohol use: Yes    Comment: occasional  .  Drug use: No  . Sexual activity: Not on file  Other Topics Concern  . Not on file  Social History Narrative   Fun: Caleb [name] out with the kids   Denies religious beliefs effecting health care.    Social Determinants of Health   Financial Resource Strain: Not on file  Food Insecurity: Not on file  Transportation Needs: Not on file  Physical Activity: Not on file  Stress: Not on file  Social Connections: Not on file  Intimate Partner Violence: Not on file    Outpatient Medications Prior to Visit  Medication Sig Dispense Refill  . amLODipine (NORVASC) 5 MG tablet Take 1 tablet (5 mg total) by mouth daily. 90 tablet 3  . atorvastatin (LIPITOR) 10 MG tablet Take 1 tablet by  mouth once daily 90 tablet 0  . Blood Glucose Monitoring Suppl (ONETOUCH VERIO) w/Device KIT Use as directed once daily E11.9 1 kit 0  . glipiZIDE (GLUCOTROL XL) 10 MG 24 hr tablet Take 1 tablet (10 mg total) by mouth daily with breakfast. 90 tablet 3  . glucose blood (ACCU-CHEK GUIDE) test strip Use to check blood sugars twice a day 100 each 3  . guaiFENesin (MUCINEX) 600 MG 12 hr tablet Take 1 tablet (600 mg total) by mouth 2 (two) times daily. 60 tablet 0  . ibuprofen (ADVIL) 800 MG tablet Take 1 tablet (800 mg total) by mouth every 8 (eight) hours as needed for moderate pain. 50 tablet 0  . insulin NPH Human (NOVOLIN N) 100 UNIT/ML injection Inject 0.5 mLs (50 Units total) into the skin every morning. And syringes 1/day 20 mL 11  . Lancets MISC Use as directed once daily E11.9 100 each 12  . pantoprazole (PROTONIX) 40 MG tablet Take 1 tablet (40 mg total) by mouth 2 (two) times daily. 180 tablet 3  . tadalafil (CIALIS) 20 MG tablet Take 0.5-1 tablets (10-20 mg total) by mouth every other day as needed for erectile dysfunction. 5 tablet 11   No facility-administered medications prior to visit.    Allergies  Allergen Reactions  . Metformin And Related Diarrhea    ROS Review of Systems  Constitutional: Negative for chills, fatigue and fever.  HENT: Positive for congestion. Negative for sore throat and trouble swallowing.   Eyes: Negative.   Respiratory: Positive for cough. Negative for shortness of breath and wheezing.   Cardiovascular: Negative for chest pain.  Gastrointestinal: Negative for abdominal pain, diarrhea, nausea and vomiting.  Endocrine: Negative.   Genitourinary: Negative.   Musculoskeletal: Negative for myalgias.  Skin: Negative.   Allergic/Immunologic: Negative.   Neurological: Negative for headaches.  Hematological: Negative.   Psychiatric/Behavioral: Negative.       Objective:     There were no vitals taken for this visit. Wt Readings from Last 3  Encounters:  07/06/20 (!) 393 lb 15.4 oz (178.7 kg)  03/21/20 (!) 387 lb (175.5 kg)  03/07/20 (!) 381 lb 3.2 oz (172.9 kg)     Health Maintenance Due  Topic Date Due  . Hepatitis C Screening  Never done  . OPHTHALMOLOGY EXAM  Never done    There are no preventive care reminders to display for this patient.  Lab Results  Component Value Date   TSH 3.07 12/20/2019   Lab Results  Component Value Date   WBC 6.6 07/07/2020   HGB 13.5 07/07/2020   HCT 43.3 07/07/2020   MCV 74.8 (L) 07/07/2020   PLT 198 07/07/2020   Lab Results  Component Value  Date   NA 133 (L) 07/07/2020   K 4.5 07/07/2020   CO2 26 07/07/2020   GLUCOSE 224 (H) 07/07/2020   BUN 13 07/07/2020   CREATININE 0.94 07/07/2020   BILITOT 0.8 07/06/2020   ALKPHOS 48 07/06/2020   AST 20 07/06/2020   ALT 23 07/06/2020   PROT 7.9 07/06/2020   ALBUMIN 4.3 07/06/2020   CALCIUM 8.6 (L) 07/07/2020   ANIONGAP 9 07/07/2020   GFR 104.48 12/20/2019   Lab Results  Component Value Date   CHOL 194 12/20/2019   Lab Results  Component Value Date   HDL 48.00 12/20/2019   Lab Results  Component Value Date   LDLCALC 77 07/23/2017   Lab Results  Component Value Date   TRIG 366.0 (H) 12/20/2019   Lab Results  Component Value Date   CHOLHDL 4 12/20/2019   Lab Results  Component Value Date   HGBA1C 9.9 (H) 07/06/2020      Assessment & Plan:   Problem List Items Addressed This Visit      Other   History of COVID-19 - Primary      Assessment and Plan: 1. History of COVID-19  Patient has mostly improved, continues to have lingering cough, has completed quarantine time, released back to work note sent on patient's behalf.  Patient encouraged to continue using supportive care treatment, hydration and rest.  Patient education given on Covid booster Follow Up Instructions:    I discussed the assessment and treatment plan with the patient. The patient was provided an opportunity to ask questions and all  were answered. The patient agreed with the plan and demonstrated an understanding of the instructions.   The patient was advised to call back or seek an in-person evaluation if the symptoms worsen or if the condition fails to improve as anticipated.  I provided 12  minutes of non-face-to-face time during this encounter.  No orders of the defined types were placed in this encounter.   Follow-up: Return if symptoms worsen or fail to improve.    Loraine Grip Peniel Biel, PA-C

## 2020-08-21 NOTE — Patient Instructions (Signed)
Continue using Mucinex and cough drops for your cough, stay well-hydrated, get plenty of rest.  I did send a note to your MyChart for release back to work.  I am thankful that you are feeling better, please let me know if there is anything else we can do for you  Roney Jaffe, PA-C Physician Assistant [facility] [email or web address]  COVID-19 Vaccine Information can be found at: [email or web address] For questions related to vaccine distribution or appointments, please email [email or web address] or call [phone].

## 2020-08-21 NOTE — Progress Notes (Signed)
Patient verified DOB Patient still has a dry cough  Patient just finished mucinex and cough drops. Mucous is now a light yellow. Patient is still able to eat and drink. Patient denies body aches at this time.

## 2020-09-05 ENCOUNTER — Other Ambulatory Visit: Payer: Self-pay | Admitting: Endocrinology

## 2020-09-06 ENCOUNTER — Encounter: Payer: Self-pay | Admitting: Internal Medicine

## 2020-09-07 ENCOUNTER — Other Ambulatory Visit: Payer: Self-pay

## 2020-09-07 MED ORDER — INSULIN NPH (HUMAN) (ISOPHANE) 100 UNIT/ML ~~LOC~~ SUSP
50.0000 [IU] | SUBCUTANEOUS | 11 refills | Status: DC
Start: 2020-09-07 — End: 2021-01-12

## 2020-09-07 MED ORDER — GLIPIZIDE ER 10 MG PO TB24
10.0000 mg | ORAL_TABLET | Freq: Every day | ORAL | 3 refills | Status: DC
Start: 2020-09-07 — End: 2021-03-22

## 2020-09-21 ENCOUNTER — Encounter: Payer: Self-pay | Admitting: Internal Medicine

## 2020-09-21 ENCOUNTER — Ambulatory Visit (INDEPENDENT_AMBULATORY_CARE_PROVIDER_SITE_OTHER): Payer: No Typology Code available for payment source | Admitting: Internal Medicine

## 2020-09-21 ENCOUNTER — Other Ambulatory Visit (INDEPENDENT_AMBULATORY_CARE_PROVIDER_SITE_OTHER): Payer: No Typology Code available for payment source

## 2020-09-21 ENCOUNTER — Other Ambulatory Visit: Payer: Self-pay

## 2020-09-21 VITALS — BP 136/82 | HR 91 | Temp 98.3°F | Ht 70.0 in | Wt >= 6400 oz

## 2020-09-21 DIAGNOSIS — Z0000 Encounter for general adult medical examination without abnormal findings: Secondary | ICD-10-CM

## 2020-09-21 DIAGNOSIS — E1165 Type 2 diabetes mellitus with hyperglycemia: Secondary | ICD-10-CM

## 2020-09-21 DIAGNOSIS — Z794 Long term (current) use of insulin: Secondary | ICD-10-CM

## 2020-09-21 DIAGNOSIS — I10 Essential (primary) hypertension: Secondary | ICD-10-CM | POA: Diagnosis not present

## 2020-09-21 DIAGNOSIS — N492 Inflammatory disorders of scrotum: Secondary | ICD-10-CM | POA: Diagnosis not present

## 2020-09-21 DIAGNOSIS — L509 Urticaria, unspecified: Secondary | ICD-10-CM | POA: Diagnosis not present

## 2020-09-21 DIAGNOSIS — Z0001 Encounter for general adult medical examination with abnormal findings: Secondary | ICD-10-CM

## 2020-09-21 DIAGNOSIS — Z8616 Personal history of COVID-19: Secondary | ICD-10-CM

## 2020-09-21 LAB — CBC WITH DIFFERENTIAL/PLATELET
Basophils Absolute: 0 10*3/uL (ref 0.0–0.1)
Basophils Relative: 0.8 % (ref 0.0–3.0)
Eosinophils Absolute: 0.2 10*3/uL (ref 0.0–0.7)
Eosinophils Relative: 2.5 % (ref 0.0–5.0)
HCT: 43.2 % (ref 39.0–52.0)
Hemoglobin: 13.9 g/dL (ref 13.0–17.0)
Lymphocytes Relative: 52.7 % — ABNORMAL HIGH (ref 12.0–46.0)
Lymphs Abs: 3.2 10*3/uL (ref 0.7–4.0)
MCHC: 32.2 g/dL (ref 30.0–36.0)
MCV: 71.9 fl — ABNORMAL LOW (ref 78.0–100.0)
Monocytes Absolute: 0.5 10*3/uL (ref 0.1–1.0)
Monocytes Relative: 8 % (ref 3.0–12.0)
Neutro Abs: 2.2 10*3/uL (ref 1.4–7.7)
Neutrophils Relative %: 36 % — ABNORMAL LOW (ref 43.0–77.0)
Platelets: 214 10*3/uL (ref 150.0–400.0)
RBC: 6.01 Mil/uL — ABNORMAL HIGH (ref 4.22–5.81)
RDW: 14.6 % (ref 11.5–15.5)
WBC: 6 10*3/uL (ref 4.0–10.5)

## 2020-09-21 NOTE — Patient Instructions (Signed)
Please continue all other medications as before, and refills have been done if requested.  Please have the pharmacy call with any other refills you may need.  Please continue your efforts at being more active, low cholesterol diet, and weight control.  You are otherwise up to date with prevention measures today.  Please keep your appointments with your specialists as you may have planned  Please go to the LAB at the blood drawing area for the tests to be done - at the [facility] lab site  You will be contacted by phone if any changes need to be made immediately.  Otherwise, you will receive a letter about your results with an explanation, but please check with MyChart first.  Please remember to sign up for MyChart if you have not done so, as this will be important to you in the future with finding out test results, communicating by private email, and scheduling acute appointments online when needed.  Please make an Appointment to return in 6 months, or sooner if needed

## 2020-09-21 NOTE — Progress Notes (Signed)
Patient ID: Caleb [name], male   DOB: [birth year], 40 y.o.   MRN: [id number]         Chief Complaint:: wellness exam and Follow-up  scrotal abscess, covid infection, hives and DM, htn       HPI:  Caleb [name] is a 40 y.o. male here for wellness exam overall up to date, and plans to make optho appt on his own soon.  Wt continues to be an issue and increased recently due to overall less activity it seems due to recent illness   Wt Readings from Last 3 Encounters:  09/21/20 (!) 407 lb (184.6 kg)  07/06/20 (!) 393 lb 15.4 oz (178.7 kg)  03/21/20 (!) 387 lb (175.5 kg)   BP Readings from Last 3 Encounters:  09/21/20 136/82  07/07/20 (!) 143/76  03/21/20 130/90   Immunization History  Administered Date(s) Administered  . Influenza,inj,Quad PF,6+ Mos 11/04/2013  . Moderna Sars-Covid-2 Vaccination 12/04/2019, 01/01/2020  . Tdap 01/06/2016, 11/21/2017   Health Maintenance Due  Topic Date Due  . OPHTHALMOLOGY EXAM  Never done         Also c/o hives for which he made this appt x 2 wks, mild to mod, constant, assoc with severe itching at times mostly to the torso, some better temporarily with benadryl, but then worse again.  Fortunately for him, the rash has essentially resolved just prior to this visit. Denies recent med or toiletry changes, pets or travel, and no obvious allergen exposures he is aware.  Also had recent covid infection approx 4 wks ago with non prod cough and nausea, mild sob but no vomiting or diarrhea. Did not require other specific tx,  Currently unvaccinated.  Also when last seen had scrotal absess/cellulitis with red tender swelling resolved with antibiotic successfully and no further pain or symptoms, and Denies urinary symptoms such as dysuria, frequency, urgency, flank pain, hematuria or n/v, fever, chills.  Past Medical History:  Diagnosis Date  . Cardiac tamponade 10/2013  . 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 Mild enlargement of pulmonary artery by CT  11/01/13.  Marland Kitchen GERD (gastroesophageal reflux disease)   . HTN (hypertension)   . Hx of echocardiogram    Echo (12/14/13):  Mod LVH, EF 50-55%, no RWMA, trivial Eff.  . LV dysfunction    a. 10/2013:  EF 45-50% with followup echo 55-60%.  . Morbid obesity ([place])   . Pericarditis    a. 10/2013: Acute pericarditis with cardiac tamponade s/p pericardiocentesis.  . Sleep apnea   . Type 2 diabetes mellitus without complication, without long-term current use of insulin ([place]) 01/02/2017   Past Surgical History:  Procedure Laterality Date  . fluid drained from heart    . PERICARDIAL TAP N/A 11/02/2013   Procedure: PERICARDIAL TAP;  Surgeon: Troy Sine, MD;  [facility]: [facility] CATH LAB;  Service: Cardiovascular;  Laterality: N/A;    reports that he has been smoking cigars and cigarettes. He has been smoking about 0.00 packs per day for the past 3.00 years. He has never used smokeless tobacco. He reports current alcohol use. He reports that he does not use drugs. family history includes CAD in an other family member; Diabetes in his father; Healthy in his maternal grandfather, maternal grandmother, and paternal grandfather; Heart attack in his father; Heart failure in his father; Hypertension in his mother; Kidney cancer in his father; Pancreatic cancer in his paternal grandmother. Allergies  Allergen Reactions  . Metformin And Related Diarrhea   Current  Outpatient Medications on File Prior to Visit  Medication Sig Dispense Refill  . amLODipine (NORVASC) 5 MG tablet Take 1 tablet (5 mg total) by mouth daily. 90 tablet 3  . atorvastatin (LIPITOR) 10 MG tablet Take 1 tablet by mouth once daily 90 tablet 0  . Blood Glucose Monitoring Suppl (ONETOUCH VERIO) w/Device KIT Use as directed once daily E11.9 1 kit 0  . glipiZIDE (GLUCOTROL XL) 10 MG 24 hr tablet Take 1 tablet (10 mg total) by mouth daily with breakfast. 90 tablet 3  . glucose blood (ACCU-CHEK GUIDE) test strip Use to check blood sugars twice a day 100  each 3  . guaiFENesin (MUCINEX) 600 MG 12 hr tablet Take 1 tablet (600 mg total) by mouth 2 (two) times daily. 60 tablet 0  . ibuprofen (ADVIL) 800 MG tablet Take 1 tablet (800 mg total) by mouth every 8 (eight) hours as needed for moderate pain. 50 tablet 0  . insulin NPH Human (NOVOLIN N) 100 UNIT/ML injection Inject 0.5 mLs (50 Units total) into the skin every morning. And syringes 1/day 20 mL 11  . Lancets MISC Use as directed once daily E11.9 100 each 12  . pantoprazole (PROTONIX) 40 MG tablet Take 1 tablet (40 mg total) by mouth 2 (two) times daily. 180 tablet 3  . tadalafil (CIALIS) 20 MG tablet Take 0.5-1 tablets (10-20 mg total) by mouth every other day as needed for erectile dysfunction. 5 tablet 11   No current facility-administered medications on file prior to visit.        ROS:  All others reviewed and negative.  Objective        PE:  BP 136/82   Pulse 91   Temp 98.3 F (36.8 C) (Oral)   Ht 5' 10" (1.778 m)   Wt (!) 407 lb (184.6 kg)   SpO2 97%   BMI 58.40 kg/m                 Constitutional: Pt appears in NAD               HENT: Head: NCAT.                Right Ear: External ear normal.                 Left Ear: External ear normal.                Eyes: . Pupils are equal, round, and reactive to light. Conjunctivae and EOM are normal               Nose: without d/c or deformity               Neck: Neck supple. Gross normal ROM               Cardiovascular: Normal rate and regular rhythm.                 Pulmonary/Chest: Effort normal and breath sounds without rales or wheezing.                Abd:  Soft, NT, ND, + BS, no organomegaly               Neurological: Pt is alert. At baseline orientation, motor grossly intact               Skin: Skin is warm. No rashes, no other new lesions, LE edema - trace bilat  Psychiatric: Pt behavior is normal without agitation   Micro: none  Cardiac tracings I have personally interpreted today:  none  Pertinent  Radiological findings (summarize): none   Lab Results  Component Value Date   WBC 6.0 09/21/2020   HGB 13.9 09/21/2020   HCT 43.2 09/21/2020   PLT 214.0 09/21/2020   GLUCOSE 195 (H) 09/21/2020   CHOL 142 09/21/2020   TRIG 87.0 09/21/2020   HDL 55.60 09/21/2020   LDLDIRECT 97.0 12/20/2019   LDLCALC 69 09/21/2020   ALT 22 09/21/2020   AST 17 09/21/2020   NA 138 09/21/2020   K 3.7 09/21/2020   CL 99 09/21/2020   CREATININE 0.92 09/21/2020   BUN 10 09/21/2020   CO2 27 09/21/2020   TSH 1.03 09/21/2020   PSA 0.32 12/20/2019   INR 1.0 07/06/2020   HGBA1C 11.9 (H) 09/21/2020   MICROALBUR 4.6 (H) 09/21/2020   Assessment/Plan:  YAREL RUSHLOW is a 40 y.o. 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), Pericarditis, Sleep apnea, and Type 2 diabetes mellitus without complication, without long-term current use of insulin ([place]) (01/02/2017). Encounter for well adult exam with abnormal findings Age and sex appropriate education and counseling updated with regular exercise and diet Referrals for preventative services - none needed Immunizations addressed - none needed Smoking counseling  - none needed - quit x 3 yrs Evidence for depression or other mood disorder - none significant Most recent labs reviewed. I have personally reviewed and have noted: 1) the patient's medical and social history 2) The patient's current medications and supplements 3) The patient's height, weight, and BMI have been recorded in the chart   Cellulitis of scrotum Resolved, no further tx needed at this time  History of COVID-19 Symptomatically resolved, pt advised for covid vaccination at 90 days  Type 2 diabetes mellitus ([place])  Stable, for a1c,  pt to continue current medical treatment glucotrol,  NPH  Current Outpatient Medications (Endocrine & Metabolic):  .  glipiZIDE (GLUCOTROL XL) 10 MG 24 hr tablet, Take 1 tablet (10 mg total) by mouth daily with breakfast. .  insulin NPH Human (NOVOLIN N) 100 UNIT/ML injection, Inject 0.5 mLs (50 Units total) into the skin every morning. And syringes 1/day  Current Outpatient Medications (Cardiovascular):  .  amLODipine (NORVASC) 5 MG tablet, Take 1 tablet (5 mg total) by mouth daily. Marland Kitchen  atorvastatin (LIPITOR) 10 MG tablet, Take 1 tablet by mouth once daily .  tadalafil (CIALIS) 20 MG tablet, Take 0.5-1 tablets (10-20 mg total) by mouth every other day as needed for erectile dysfunction.  Current Outpatient Medications (Respiratory):  .  guaiFENesin (MUCINEX) 600 MG 12 hr tablet, Take 1 tablet (600 mg total) by mouth 2 (two) times daily.  Current Outpatient Medications (Analgesics):  .  ibuprofen (ADVIL) 800 MG tablet, Take 1 tablet (800 mg total) by mouth every 8 (eight) hours as needed for moderate pain.   Current Outpatient Medications (Other):  .  Blood Glucose Monitoring Suppl (ONETOUCH VERIO) w/Device KIT, Use as directed once daily E11.9 .  glucose blood (ACCU-CHEK GUIDE) test strip, Use to check blood sugars twice a day .  Lancets MISC, Use as directed once daily E11.9 .  pantoprazole (PROTONIX) 40 MG tablet, Take 1 tablet (40 mg total) by mouth 2 (two) times daily.   Essential hypertension BP Readings from Last  3 Encounters:  09/21/20 136/82  07/07/20 (!) 143/76  03/21/20 130/90   Stable, pt to continue medical treatment amlodipine, declnies change to ace/arb today for renoprotective   Hives None today but per pt very recent symptoms, ok for benadryl prn, continue to follow for recurrent symptoms  Followup: Return in about 6 months (around 03/21/2021).  Cathlean Cower, MD 09/24/2020 9:59 AM [facility]

## 2020-09-22 LAB — HEPATIC FUNCTION PANEL
ALT: 22 U/L (ref 0–53)
AST: 17 U/L (ref 0–37)
Albumin: 4.3 g/dL (ref 3.5–5.2)
Alkaline Phosphatase: 54 U/L (ref 39–117)
Bilirubin, Direct: 0.1 mg/dL (ref 0.0–0.3)
Total Bilirubin: 0.6 mg/dL (ref 0.2–1.2)
Total Protein: 7.5 g/dL (ref 6.0–8.3)

## 2020-09-22 LAB — URINALYSIS, ROUTINE W REFLEX MICROSCOPIC
Bilirubin Urine: NEGATIVE
Hgb urine dipstick: NEGATIVE
Ketones, ur: NEGATIVE
Leukocytes,Ua: NEGATIVE
Nitrite: NEGATIVE
RBC / HPF: NONE SEEN (ref 0–?)
Specific Gravity, Urine: >=1.03 — AB (ref 1.000–1.030)
Total Protein, Urine: NEGATIVE
Urine Glucose: >=1000 — AB
Urobilinogen, UA: 0.2 (ref 0.0–1.0)
WBC, UA: NONE SEEN (ref 0–?)
pH: 6 (ref 5.0–8.0)

## 2020-09-22 LAB — MICROALBUMIN / CREATININE URINE RATIO
Creatinine,U: 221.2 mg/dL
Microalb Creat Ratio: 2.1 mg/g (ref 0.0–30.0)
Microalb, Ur: 4.6 mg/dL — ABNORMAL HIGH (ref 0.0–1.9)

## 2020-09-22 LAB — BASIC METABOLIC PANEL
BUN: 10 mg/dL (ref 6–23)
CO2: 27 mEq/L (ref 19–32)
Calcium: 9.1 mg/dL (ref 8.4–10.5)
Chloride: 99 mEq/L (ref 96–112)
Creatinine, Ser: 0.92 mg/dL (ref 0.40–1.50)
GFR: 106.52 mL/min (ref >60.00)
Glucose, Bld: 195 mg/dL — ABNORMAL HIGH (ref 70–99)
Potassium: 3.7 mEq/L (ref 3.5–5.1)
Sodium: 138 mEq/L (ref 135–145)

## 2020-09-22 LAB — LIPID PANEL
Cholesterol: 142 mg/dL (ref 0–200)
HDL: 55.6 mg/dL (ref >39.00)
LDL Cholesterol: 69 mg/dL (ref 0–99)
NonHDL: 86.7
Total CHOL/HDL Ratio: 3
Triglycerides: 87 mg/dL (ref 0.0–149.0)
VLDL: 17.4 mg/dL (ref 0.0–40.0)

## 2020-09-22 LAB — HEMOGLOBIN A1C: Hgb A1c MFr Bld: 11.9 % — ABNORMAL HIGH (ref 4.6–6.5)

## 2020-09-22 LAB — TSH: TSH: 1.03 u[IU]/mL (ref 0.35–4.50)

## 2020-09-23 ENCOUNTER — Other Ambulatory Visit: Payer: Self-pay | Admitting: Internal Medicine

## 2020-09-23 ENCOUNTER — Encounter: Payer: Self-pay | Admitting: Internal Medicine

## 2020-09-23 DIAGNOSIS — E1165 Type 2 diabetes mellitus with hyperglycemia: Secondary | ICD-10-CM

## 2020-09-23 DIAGNOSIS — R1310 Dysphagia, unspecified: Secondary | ICD-10-CM

## 2020-09-24 ENCOUNTER — Encounter: Payer: Self-pay | Admitting: Internal Medicine

## 2020-09-24 DIAGNOSIS — L509 Urticaria, unspecified: Secondary | ICD-10-CM | POA: Insufficient documentation

## 2020-09-24 NOTE — Assessment & Plan Note (Signed)
Resolved, no further tx needed at this time 

## 2020-09-24 NOTE — Assessment & Plan Note (Signed)
None today but per pt very recent symptoms, ok for benadryl prn, continue to follow for recurrent symptoms

## 2020-09-24 NOTE — Assessment & Plan Note (Signed)
Symptomatically resolved, pt advised for covid vaccination at 90 days

## 2020-09-24 NOTE — Assessment & Plan Note (Signed)
  Stable, for a1c,  pt to continue current medical treatment glucotrol, NPH  Current Outpatient Medications (Endocrine & Metabolic):  .  glipiZIDE (GLUCOTROL XL) 10 MG 24 hr tablet, Take 1 tablet (10 mg total) by mouth daily with breakfast. .  insulin NPH Human (NOVOLIN N) 100 UNIT/ML injection, Inject 0.5 mLs (50 Units total) into the skin every morning. And syringes 1/day  Current Outpatient Medications (Cardiovascular):  .  amLODipine (NORVASC) 5 MG tablet, Take 1 tablet (5 mg total) by mouth daily. Marland Kitchen  atorvastatin (LIPITOR) 10 MG tablet, Take 1 tablet by mouth once daily .  tadalafil (CIALIS) 20 MG tablet, Take 0.5-1 tablets (10-20 mg total) by mouth every other day as needed for erectile dysfunction.  Current Outpatient Medications (Respiratory):  .  guaiFENesin (MUCINEX) 600 MG 12 hr tablet, Take 1 tablet (600 mg total) by mouth 2 (two) times daily.  Current Outpatient Medications (Analgesics):  .  ibuprofen (ADVIL) 800 MG tablet, Take 1 tablet (800 mg total) by mouth every 8 (eight) hours as needed for moderate pain.   Current Outpatient Medications (Other):  .  Blood Glucose Monitoring Suppl (ONETOUCH VERIO) w/Device KIT, Use as directed once daily E11.9 .  glucose blood (ACCU-CHEK GUIDE) test strip, Use to check blood sugars twice a day .  Lancets MISC, Use as directed once daily E11.9 .  pantoprazole (PROTONIX) 40 MG tablet, Take 1 tablet (40 mg total) by mouth 2 (two) times daily.

## 2020-09-24 NOTE — Assessment & Plan Note (Addendum)
BP Readings from Last 3 Encounters:  09/21/20 136/82  07/07/20 (!) 143/76  03/21/20 130/90   Stable, pt to continue medical treatment amlodipine, declnies change to ace/arb today for renoprotective

## 2020-09-24 NOTE — Assessment & Plan Note (Signed)
Age and sex appropriate education and counseling updated with regular exercise and diet Referrals for preventative services - none needed Immunizations addressed - none needed Smoking counseling  - none needed - quit x 3 yrs Evidence for depression or other mood disorder - none significant Most recent labs reviewed. I have personally reviewed and have noted: 1) the patient's medical and social history 2) The patient's current medications and supplements 3) The patient's height, weight, and BMI have been recorded in the chart

## 2020-10-13 ENCOUNTER — Encounter: Payer: Self-pay | Admitting: Gastroenterology

## 2020-10-18 ENCOUNTER — Encounter: Payer: Self-pay | Admitting: Internal Medicine

## 2020-10-23 ENCOUNTER — Ambulatory Visit: Payer: No Typology Code available for payment source | Admitting: Internal Medicine

## 2020-10-23 ENCOUNTER — Encounter: Payer: Self-pay | Admitting: Internal Medicine

## 2020-10-30 ENCOUNTER — Ambulatory Visit (INDEPENDENT_AMBULATORY_CARE_PROVIDER_SITE_OTHER): Payer: No Typology Code available for payment source | Admitting: Gastroenterology

## 2020-10-30 ENCOUNTER — Other Ambulatory Visit: Payer: Self-pay

## 2020-10-30 ENCOUNTER — Other Ambulatory Visit (HOSPITAL_COMMUNITY): Payer: Self-pay

## 2020-10-30 ENCOUNTER — Encounter: Payer: Self-pay | Admitting: Gastroenterology

## 2020-10-30 VITALS — BP 144/98 | HR 88 | Ht 69.0 in | Wt >= 6400 oz

## 2020-10-30 DIAGNOSIS — R1312 Dysphagia, oropharyngeal phase: Secondary | ICD-10-CM | POA: Diagnosis not present

## 2020-10-30 DIAGNOSIS — R1310 Dysphagia, unspecified: Secondary | ICD-10-CM

## 2020-10-30 NOTE — Patient Instructions (Addendum)
If you are age 38 or younger, your body mass index should be between 19-25. Your Body mass index is 59.72 kg/m. If this is out of the aformentioned range listed, please consider follow up with your Primary Care Provider.    We place placed a referral to Ambulatory referral to ENT  Where: [facility] Address: [address] [place] [address] Phone: [phone] Expires: 10/30/2021 (requested)  .  You should hear from their office in the next couple weeks to schedule an appointment. If you do not hear from them please call them at [phone].   You have been scheduled for a modified barium swallow  Please arrive 15 minutes prior to your test for registration. You will go to [facility] (1st Floor) for your appointment. Should you need to cancel or reschedule your appointment, please contact [phone] [facility] [place]) or [phone] [facility]). _____________________________________________________________________ A Modified Barium Swallow Study, or MBS, is a special x-ray that is taken to check swallowing skills. It is carried out by a Marine scientist and a Warehouse manager (SLP). During this test, yourmouth, throat, and esophagus, a muscular tube which connects your mouth to your stomach, is checked. The test will help you, your doctor, and the SLP plan what types of foods and liquids are easier for you to swallow. The SLP will also identify positions and ways to help you swallow more easily and safely. What will happen during an MBS? You will be taken to an x-ray room and seated comfortably. You will be asked to swallow small amounts of food and liquid mixed with barium. Barium is a liquid or paste that allows images of your mouth, throat and esophagus to be seen on x-ray. The x-ray captures moving images of the food you are swallowing as it travels from your mouth through your throat and into your esophagus. This test helps identify whether food or liquid  is entering your lungs (aspiration). The test also shows which part of your mouth or throat lacks strength or coordination to move the food or liquid in the right direction. This test typically takes 30 minutes to 1 hour to complete. _______________________________________________________________________

## 2020-10-30 NOTE — Progress Notes (Signed)
10/30/2020 Caleb [name] [id number] [birth year]   HISTORY OF PRESENT ILLNESS:  This is a 40 year old male with complaints of oropharyngeal dysphagia for the past 2 months.  He reports that anytime he tries to eat or drink anything, within the first bite or for sip, he feels like it gets swollen on the left side of his neck under his jaw.  He also describes a vein that gets enlarged and feels like he has enlarged lymph nodes on that side and that he has to massage in order to be able to swallow (has pictures).  It only occurs on the left.  Has never experienced anything similar to this prior to this starting 2 months ago.  He does have issues with acid reflux, but takes pantoprazole 40 mg twice daily and that works to control his symptoms extremely well.  As long as he takes that he does not have any noticeable acid reflux issues.  He denies food getting stuck down in his esophagus.   Past Medical History:  Diagnosis Date  . Cardiac tamponade 10/2013  . 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 Mild enlargement of pulmonary artery by CT 11/01/13.  Marland Kitchen GERD (gastroesophageal reflux disease)   . HTN (hypertension)   . Hx of echocardiogram    Echo (12/14/13):  Mod LVH, EF 50-55%, no RWMA, trivial Eff.  . LV dysfunction    a. 10/2013:  EF 45-50% with followup echo 55-60%.  . Morbid obesity ([place])   . Pericarditis    a. 10/2013: Acute pericarditis with cardiac tamponade s/p pericardiocentesis.  . Sleep apnea   . Type 2 diabetes mellitus without complication, without long-term current use of insulin ([place]) 01/02/2017   Past Surgical History:  Procedure Laterality Date  . PERICARDIAL TAP N/A 11/02/2013   Procedure: PERICARDIAL TAP;  Surgeon: Troy Sine, MD;  Location: [facility] CATH LAB;  Service: Cardiovascular;  Laterality: N/A;    reports that he has been smoking cigars and cigarettes. He has been smoking about 0.00 packs per day for the past 3.00 years. He has never used smokeless tobacco. He reports  current alcohol use. He reports that he does not use drugs. family history includes Breast cancer in his maternal grandmother; CAD in an other family member; Colon cancer in his paternal grandmother and paternal uncle; Congestive Heart Failure in his maternal grandfather; Diabetes in his brother, father, maternal grandfather, maternal grandmother, and mother; Healthy in his paternal grandfather; Heart attack in his father; Heart disease in his mother; Heart failure in his father; Hyperlipidemia in his maternal grandfather, maternal grandmother, and mother; Hypertension in his maternal grandmother and mother; Kidney cancer in his father; Kidney disease in his mother; Non-Hodgkin's lymphoma in his father; Prostate cancer in his paternal uncle; Sleep apnea in his brother. Allergies  Allergen Reactions  . Metformin And Related Diarrhea      Outpatient Encounter Medications as of 10/30/2020  Medication Sig  . amLODipine (NORVASC) 5 MG tablet Take 1 tablet (5 mg total) by mouth daily.  Marland Kitchen atorvastatin (LIPITOR) 10 MG tablet Take 1 tablet by mouth once daily  . Blood Glucose Monitoring Suppl (ONETOUCH VERIO) w/Device KIT Use as directed once daily E11.9  . glipiZIDE (GLUCOTROL XL) 10 MG 24 hr tablet Take 1 tablet (10 mg total) by mouth daily with breakfast.  . glucose blood (ACCU-CHEK GUIDE) test strip Use to check blood sugars twice a day  . ibuprofen (ADVIL) 800 MG tablet Take 1 tablet (800 mg total)  by mouth every 8 (eight) hours as needed for moderate pain.  Marland Kitchen insulin NPH Human (NOVOLIN N) 100 UNIT/ML injection Inject 0.5 mLs (50 Units total) into the skin every morning. And syringes 1/day  . Lancets MISC Use as directed once daily E11.9  . pantoprazole (PROTONIX) 40 MG tablet Take 1 tablet (40 mg total) by mouth 2 (two) times daily.  . [DISCONTINUED] guaiFENesin (MUCINEX) 600 MG 12 hr tablet Take 1 tablet (600 mg total) by mouth 2 (two) times daily.  . [DISCONTINUED] tadalafil (CIALIS) 20 MG tablet  Take 0.5-1 tablets (10-20 mg total) by mouth every other day as needed for erectile dysfunction.   No facility-administered encounter medications on file as of 10/30/2020.    REVIEW OF SYSTEMS  : All other systems reviewed and negative except where noted in the History of Present Illness.   PHYSICAL EXAM: BP (!) 144/98 (BP Location: Left Arm, Patient Position: Sitting, Cuff Size: Large)   Pulse 88   Ht _0  (1.753 m) Comment: height measured without shoes  Wt (!) 404 lb 6 oz (183.4 kg)   BMI 59.72 kg/m  General: Well developed AA male in no acute distress Head: Normocephalic and atraumatic Eyes:  Sclerae anicteric, conjunctiva pink. Ears: Normal auditory acuity Neck:  No lymphadenopathy or swollen glands noted.   Lungs: Clear throughout to auscultation; no W/R/R. Heart: Regular rate and rhythm; no M/R/G. Musculoskeletal: Symmetrical with no gross deformities  Skin: No lesions on visible extremities Extremities: No edema  Neurological: Alert oriented x 4, grossly non-focal Psychological:  Alert and cooperative. Normal mood and affect  ASSESSMENT AND PLAN: *40 year old male with complaints of oropharyngeal dysphagia for the past 2 months.  He reports that anytime he tries to eat or drink anything, within the first bite or for sip, he feels like it gets swollen on the left side of his neck under his jaw.  He also describes a vein that gets enlarged and feels like he has enlarged lymph nodes on that side and that he has to massage in order to be able to swallow.  It only occurs on the left.  Has never experienced anything similar to this prior to this starting 2 months ago.  Not sure that this is a "GI" issue.  Once again sounds more oropharyngeal.  I am going to get a modified barium swallow study so they can evaluate his mechanisms of swallowing.  I am also going to refer him to ENT so that they can examine him thoroughly and give their input.   CC:  Biagio Borg, MD

## 2020-10-30 NOTE — Progress Notes (Signed)
Reviewed and agree with management plan. Agree with MBSS and ENT referral.   Judie Petit T. Russella Dar, MD FACG [phone]

## 2020-11-13 ENCOUNTER — Ambulatory Visit (HOSPITAL_COMMUNITY)
Admission: RE | Admit: 2020-11-13 | Discharge: 2020-11-13 | Disposition: A | Discharge status: Home / Self Care | Payer: No Typology Code available for payment source | Source: Ambulatory Visit | Attending: Gastroenterology | Admitting: Gastroenterology

## 2020-11-13 ENCOUNTER — Other Ambulatory Visit: Payer: Self-pay

## 2020-11-13 DIAGNOSIS — R1310 Dysphagia, unspecified: Secondary | ICD-10-CM | POA: Insufficient documentation

## 2020-11-13 DIAGNOSIS — R1312 Dysphagia, oropharyngeal phase: Secondary | ICD-10-CM

## 2020-11-17 ENCOUNTER — Ambulatory Visit (HOSPITAL_COMMUNITY)
Admission: EM | Admit: 2020-11-17 | Discharge: 2020-11-17 | Disposition: A | Discharge status: Home / Self Care | Payer: No Typology Code available for payment source | Attending: Student | Admitting: Student

## 2020-11-17 ENCOUNTER — Other Ambulatory Visit: Payer: Self-pay

## 2020-11-17 ENCOUNTER — Encounter (HOSPITAL_COMMUNITY): Payer: Self-pay | Admitting: Emergency Medicine

## 2020-11-17 DIAGNOSIS — I10 Essential (primary) hypertension: Secondary | ICD-10-CM | POA: Diagnosis not present

## 2020-11-17 DIAGNOSIS — E1169 Type 2 diabetes mellitus with other specified complication: Secondary | ICD-10-CM | POA: Diagnosis not present

## 2020-11-17 DIAGNOSIS — J069 Acute upper respiratory infection, unspecified: Secondary | ICD-10-CM

## 2020-11-17 DIAGNOSIS — J209 Acute bronchitis, unspecified: Secondary | ICD-10-CM | POA: Diagnosis not present

## 2020-11-17 DIAGNOSIS — Z794 Long term (current) use of insulin: Secondary | ICD-10-CM

## 2020-11-17 DIAGNOSIS — Z8679 Personal history of other diseases of the circulatory system: Secondary | ICD-10-CM

## 2020-11-17 MED ORDER — QVAR REDIHALER 80 MCG/ACT IN AERB: 2.0000 | INHALATION_SPRAY | Freq: Two times a day (BID) | RESPIRATORY_TRACT | 0 refills | Status: AC

## 2020-11-17 MED ORDER — ALBUTEROL SULFATE HFA 108 (90 BASE) MCG/ACT IN AERS: 1.0000 | INHALATION_SPRAY | Freq: Four times a day (QID) | RESPIRATORY_TRACT | 0 refills | Status: DC | PRN

## 2020-11-17 NOTE — Discharge Instructions (Addendum)
-  Qvar steroid inhaler for bronchitis.  Inhale 2 puffs into the lungs twice daily for 14 days.  Make sure to swish and spit water after -Albuterol inhaler as needed for cough and shortness of breath. -If you develop shortness of breath, dizziness, chest pain, pain in left arm-head to the emergency room.  We cannot rule out pericarditis here, though your EKG looks normal today.

## 2020-11-17 NOTE — ED Provider Notes (Signed)
[facility]    CSN: [id number] Arrival date & time: 11/17/20  [place]      History   Chief Complaint Chief Complaint  Patient presents with  . Cough  . Wheezing    HPI ISAC LINCKS Caleb a 40 y.o. [name] presenting with 1 week of cough and wheezing.  Has been taking Mucinex with minimal relief.  Medical history of hypertension, cardiac tamponade, enlarged pulmonary artery, sleep apnea, type 2 diabetes, morbid obesity, pericarditis.  Notes 1 week of productive hacking cough associated with wheezing.  Mucinex providing little relief.  No history of asthma or pulmonary disease. Patient Caleb requesting EKG given history of pericarditis.  Denies absolutely any chest pain, shortness of breath, dizziness, pain in left arm, jaw pain, nausea/vomiting/diarrhea.  Denies fever/chills.  HPI  Past Medical History:  Diagnosis Date  . Cardiac tamponade 10/2013  . 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 Mild enlargement of pulmonary artery by CT 11/01/13.  Marland Kitchen GERD (gastroesophageal reflux disease)   . HTN (hypertension)   . Hx of echocardiogram    Echo (12/14/13):  Mod LVH, EF 50-55%, no RWMA, trivial Eff.  . LV dysfunction    a. 10/2013:  EF 45-50% with followup echo 55-60%.  . Morbid obesity ([place])   . Pericarditis    a. 10/2013: Acute pericarditis with cardiac tamponade s/p pericardiocentesis.  . Sleep apnea   . Type 2 diabetes mellitus without complication, without long-term current use of insulin ([place]) 01/02/2017    Patient Active Problem List   Diagnosis Date Noted  . Oropharyngeal dysphagia 10/30/2020  . Hives 09/24/2020  . History of COVID-19 08/21/2020  . COVID-19 08/16/2020  . Cellulitis of scrotum 07/06/2020  . Sepsis ([place]) 07/06/2020  . Type 2 diabetes mellitus ([place]) 07/06/2020  . Urinary frequency 12/16/2019  . Left carpal tunnel syndrome 12/16/2019  . Nocturia more than twice per night 10/28/2018  . Erectile dysfunction 10/28/2018  . Encounter for well adult exam with  abnormal findings 07/23/2017  . GERD (gastroesophageal reflux disease)   . OSA (obstructive sleep apnea) 05/16/2014  . Cardiac tamponade 11/04/2013  . Morbid obesity ([place]) 11/04/2013  . Essential hypertension 11/04/2013  . Acute pericarditis 11/02/2013    Past Surgical History:  Procedure Laterality Date  . PERICARDIAL TAP N/A 11/02/2013   Procedure: PERICARDIAL TAP;  Surgeon: Troy Sine, MD;  Location: [facility] CATH LAB;  Service: Cardiovascular;  Laterality: N/A;       Home Medications    Prior to Admission medications   Medication Sig Start Date End Date Taking? Authorizing Provider  albuterol (VENTOLIN HFA) 108 (90 Base) MCG/ACT inhaler Inhale 1-2 puffs into the lungs every 6 (six) hours as needed for wheezing or shortness of breath. 11/17/20  Yes Hazel Sams, PA-C  beclomethasone (QVAR REDIHALER) 80 MCG/ACT inhaler Inhale 2 puffs into the lungs 2 (two) times daily for 14 days. 11/17/20 12/01/20 Yes Hazel Sams, PA-C  amLODipine (NORVASC) 5 MG tablet Take 1 tablet (5 mg total) by mouth daily. 12/16/19 12/15/20  Biagio Borg, MD  atorvastatin (LIPITOR) 10 MG tablet Take 1 tablet by mouth once daily 03/29/20   Biagio Borg, MD  Blood Glucose Monitoring Suppl [facility] VERIO) w/Device KIT Use as directed once daily E11.9 01/31/20   Biagio Borg, MD  glipiZIDE (GLUCOTROL XL) 10 MG 24 hr tablet Take 1 tablet (10 mg total) by mouth daily with breakfast. 09/07/20   Biagio Borg, MD  glucose blood (ACCU-CHEK GUIDE)  test strip Use to check blood sugars twice a day 03/23/20   Biagio Borg, MD  ibuprofen (ADVIL) 800 MG tablet Take 1 tablet (800 mg total) by mouth every 8 (eight) hours as needed for moderate pain. 12/16/19   Biagio Borg, MD  insulin NPH Human (NOVOLIN N) 100 UNIT/ML injection Inject 0.5 mLs (50 Units total) into the skin every morning. And syringes 1/day 09/07/20   Biagio Borg, MD  Lancets MISC Use as directed once daily E11.9 01/31/20   Biagio Borg, MD  pantoprazole  (PROTONIX) 40 MG tablet Take 1 tablet (40 mg total) by mouth 2 (two) times daily. 12/16/19   Biagio Borg, MD    Family History Family History  Problem Relation Age of Onset  . Heart failure Father   . Heart attack Father   . Kidney cancer Father   . Diabetes Father   . Non-Hodgkin's lymphoma Father   . Colon cancer Paternal Grandmother   . Hypertension Mother   . Heart disease Mother   . Kidney disease Mother   . Diabetes Mother   . Hyperlipidemia Mother   . Breast cancer Maternal Grandmother   . Diabetes Maternal Grandmother   . Hypertension Maternal Grandmother   . Hyperlipidemia Maternal Grandmother   . Diabetes Maternal Grandfather   . Hyperlipidemia Maternal Grandfather   . Congestive Heart Failure Maternal Grandfather   . Healthy Paternal Grandfather   . CAD Other        Aunt with CABG  . Diabetes Brother   . Colon cancer Paternal Uncle   . Prostate cancer Paternal Uncle   . Sleep apnea Brother   . Stroke Neg Hx     Social History Social History   Tobacco Use  . Smoking status: Current Some Day Smoker    Packs/day: 0.00    Years: 3.00    Pack years: 0.00    Types: Cigars, Cigarettes  . Smokeless tobacco: Never Used  . Tobacco comment: 1 cigar daily  Vaping Use  . Vaping Use: Never used  Substance Use Topics  . Alcohol use: Yes    Comment: occasional  . Drug use: No     Allergies   Metformin and related   Review of Systems Review of Systems  Constitutional: Negative for appetite change, chills and fever.  HENT: Positive for congestion. Negative for ear pain, rhinorrhea, sinus pressure, sinus pain and sore throat.   Eyes: Negative for redness and visual disturbance.  Respiratory: Positive for cough and wheezing. Negative for chest tightness and shortness of breath.   Cardiovascular: Negative for chest pain and palpitations.  Gastrointestinal: Negative for abdominal pain, constipation, diarrhea, nausea and vomiting.  Genitourinary: Negative for  dysuria, frequency and urgency.  Musculoskeletal: Negative for myalgias.  Neurological: Negative for dizziness, weakness and headaches.  Psychiatric/Behavioral: Negative for confusion.  All other systems reviewed and are negative.    Physical Exam Triage Vital Signs ED Triage Vitals  Enc Vitals Group     BP 11/17/20 1908 (!) 169/112     Pulse Rate 11/17/20 1908 90     Resp 11/17/20 1908 18     Temp 11/17/20 1908 98.3 F (36.8 C)     Temp Source 11/17/20 1908 Oral     SpO2 11/17/20 1908 98 %     Weight --      Height --      Head Circumference --      Peak Flow --  Pain Score 11/17/20 1905 0     Pain Loc --      Pain Edu? --      Excl. in [place]? --    No data found.  Updated Vital Signs BP (!) 169/112 (BP Location: Left Wrist)   Pulse 90   Temp 98.3 F (36.8 C) (Oral)   Resp 18   SpO2 98%   Visual Acuity Right Eye Distance:   Left Eye Distance:   Bilateral Distance:    Right Eye Near:   Left Eye Near:    Bilateral Near:     Physical Exam Vitals reviewed.  Constitutional:      General: He Caleb not in acute distress.    Appearance: Normal appearance. He Caleb obese. He Caleb not ill-appearing or diaphoretic.  HENT:     Head: Normocephalic and atraumatic.     Right Ear: Hearing, tympanic membrane, ear canal and external ear normal. No swelling or tenderness. There Caleb no impacted cerumen. No mastoid tenderness. Tympanic membrane Caleb not perforated, erythematous, retracted or bulging.     Left Ear: Hearing, tympanic membrane, ear canal and external ear normal. No swelling or tenderness. There Caleb no impacted cerumen. No mastoid tenderness. Tympanic membrane Caleb not perforated, erythematous, retracted or bulging.     Nose:     Right Sinus: No maxillary sinus tenderness or frontal sinus tenderness.     Left Sinus: No maxillary sinus tenderness or frontal sinus tenderness.     Mouth/Throat:     Mouth: Mucous membranes are moist.     Pharynx: Uvula midline. No oropharyngeal  exudate or posterior oropharyngeal erythema.     Tonsils: No tonsillar exudate.  Eyes:     Extraocular Movements: Extraocular movements intact.     Pupils: Pupils are equal, round, and reactive to light.  Cardiovascular:     Rate and Rhythm: Normal rate and regular rhythm.     Pulses:          Radial pulses are 2+ on the right side and 2+ on the left side.     Heart sounds: Normal heart sounds.  Pulmonary:     Effort: Pulmonary effort Caleb normal.     Breath sounds: Normal breath sounds and air entry. No wheezing, rhonchi or rales.     Comments: Frequent hacking cough Chest:     Chest wall: No tenderness.  Abdominal:     General: Abdomen Caleb flat. Bowel sounds are normal.     Palpations: Abdomen Caleb soft.     Tenderness: There Caleb no abdominal tenderness. There Caleb no guarding or rebound.  Musculoskeletal:     Right lower leg: No edema.     Left lower leg: No edema.  Lymphadenopathy:     Cervical: No cervical adenopathy.  Skin:    General: Skin Caleb warm.     Capillary Refill: Capillary refill takes less than 2 seconds.  Neurological:     General: No focal deficit present.     Mental Status: He Caleb alert and oriented to person, place, and time.  Psychiatric:        Attention and Perception: Attention and perception normal.        Mood and Affect: Mood and affect normal.        Behavior: Behavior normal. Behavior Caleb cooperative.        Thought Content: Thought content normal.        Judgment: Judgment normal.      UC Treatments / Results  Labs (  all labs ordered are listed, but only abnormal results are displayed) Labs Reviewed - No data to display  EKG   Radiology No results found.  Procedures Procedures (including critical care time)  Medications Ordered in UC Medications - No data to display  Initial Impression / Assessment and Plan / UC Course  I have reviewed the triage vital signs and the nursing notes.  Pertinent labs & imaging results that were available  during my care of the patient were reviewed by me and considered in my medical decision making (see chart for details).     This patient Caleb a 40 year old [name] presenting with viral URI with cough and bronchitis. Today this pt Caleb afebrile nontachycardic nontachypneic, oxygenating well on room air, no wheezes rhonchi or rales.  Patient with history of pericarditis, EKG today  NSR.  Absolutely no chest pain, dizziness, shortness of breath, etc. Type 2 diabetes Caleb poorly controlled, last A1c 11.9 one month ago.  Given this, will treat with Qvar inhaler as opposed to prednisone.  Also sent albuterol inhaler.  Continue over-the-counter medications for symptomatic relief. For hypertension, continue current regimen; monitor blood pressures at home. Declines Covid PCR test today. ED return precautions discussed.  Final Clinical Impressions(s) / UC Diagnoses   Final diagnoses:  Acute bronchitis, unspecified organism  Viral URI with cough  Essential hypertension  History of pericarditis  Type 2 diabetes mellitus with other specified complication, with long-term current use of insulin ([place])     Discharge Instructions     -Qvar steroid inhaler for bronchitis.  Inhale 2 puffs into the lungs twice daily for 14 days.  Make sure to swish and spit water after -Albuterol inhaler as needed for cough and shortness of breath. -If you develop shortness of breath, dizziness, chest pain, pain in left arm-head to the emergency room.  We cannot rule out pericarditis here, though your EKG looks normal today.    ED Prescriptions    Medication Sig Dispense Auth. Provider   beclomethasone (QVAR REDIHALER) 80 MCG/ACT inhaler Inhale 2 puffs into the lungs 2 (two) times daily for 14 days. 1 each Hazel Sams, PA-C   albuterol (VENTOLIN HFA) 108 (90 Base) MCG/ACT inhaler Inhale 1-2 puffs into the lungs every 6 (six) hours as needed for wheezing or shortness of breath. 1 each Hazel Sams, PA-C     PDMP not  reviewed this encounter.   Hazel Sams, PA-C 11/17/20 2007

## 2020-11-17 NOTE — ED Triage Notes (Signed)
Pt presents with cough and wheezing xs 1 week. States has been taking mucinex around the clock with no relief.

## 2020-11-20 ENCOUNTER — Encounter (INDEPENDENT_AMBULATORY_CARE_PROVIDER_SITE_OTHER): Payer: Self-pay | Admitting: Otolaryngology

## 2020-11-20 ENCOUNTER — Ambulatory Visit (INDEPENDENT_AMBULATORY_CARE_PROVIDER_SITE_OTHER): Payer: No Typology Code available for payment source | Admitting: Otolaryngology

## 2020-11-20 ENCOUNTER — Other Ambulatory Visit: Payer: Self-pay

## 2020-11-20 VITALS — Temp 97.2°F

## 2020-11-20 DIAGNOSIS — K1120 Sialoadenitis, unspecified: Secondary | ICD-10-CM | POA: Diagnosis not present

## 2020-11-20 NOTE — Progress Notes (Signed)
HPI: Caleb [name] is a 40 y.o. male who presents is referred by his PCP for evaluation of left neck swelling and discomfort when he eats.  This began initially about 2-1/2 months ago.  He was eating he described a gland swelling up in his left neck and having some swelling underneath the left side of his tongue and pain and discomfort.  He is having minimal pain today.  He has no difficulty eating or swallowing.Marland Kitchen He shows me a picture that he took of his floor mouth on the left side when he had swelling and this showed swelling in the region of the submandibular duct and floor mouth on the left side.  Past Medical History:  Diagnosis Date  . Cardiac tamponade 10/2013  . 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 Mild enlargement of pulmonary artery by CT 11/01/13.  Marland Kitchen GERD (gastroesophageal reflux disease)   . HTN (hypertension)   . Hx of echocardiogram    Echo (12/14/13):  Mod LVH, EF 50-55%, no RWMA, trivial Eff.  . LV dysfunction    a. 10/2013:  EF 45-50% with followup echo 55-60%.  . Morbid obesity ([place])   . Pericarditis    a. 10/2013: Acute pericarditis with cardiac tamponade s/p pericardiocentesis.  . Sleep apnea   . Type 2 diabetes mellitus without complication, without long-term current use of insulin ([place]) 01/02/2017   Past Surgical History:  Procedure Laterality Date  . PERICARDIAL TAP N/A 11/02/2013   Procedure: PERICARDIAL TAP;  Surgeon: Troy Sine, MD;  Location: [facility] CATH LAB;  Service: Cardiovascular;  Laterality: N/A;   Social History   Socioeconomic History  . Marital status: Single    Spouse name: Not on file  . Number of children: 2  . Years of education: 21  . Highest education level: Not on file  Occupational History  . Occupation: delivery Education administrator: [employer]  Tobacco Use  . Smoking status: Current Some Day Smoker    Packs/day: 0.00    Years: 5.00    Pack years: 0.00    Types: Cigars, Cigarettes    Start date: 2017  . Smokeless tobacco: Never Used   . Tobacco comment: 1 cigar daily  Vaping Use  . Vaping Use: Never used  Substance and Sexual Activity  . Alcohol use: Yes    Comment: occasional  . Drug use: No  . Sexual activity: Not on file  Other Topics Concern  . Not on file  Social History Narrative   Fun: Elbert Ewings out with the kids   Denies religious beliefs effecting health care.    Social Determinants of Health   Financial Resource Strain: Not on file  Food Insecurity: Not on file  Transportation Needs: Not on file  Physical Activity: Not on file  Stress: Not on file  Social Connections: Not on file   Family History  Problem Relation Age of Onset  . Heart failure Father   . Heart attack Father   . Kidney cancer Father   . Diabetes Father   . Non-Hodgkin's lymphoma Father   . Colon cancer Paternal Grandmother   . Hypertension Mother   . Heart disease Mother   . Kidney disease Mother   . Diabetes Mother   . Hyperlipidemia Mother   . Breast cancer Maternal Grandmother   . Diabetes Maternal Grandmother   . Hypertension Maternal Grandmother   . Hyperlipidemia Maternal Grandmother   . Diabetes Maternal Grandfather   . Hyperlipidemia Maternal Grandfather   .  Congestive Heart Failure Maternal Grandfather   . Healthy Paternal Grandfather   . CAD Other        Aunt with CABG  . Diabetes Brother   . Colon cancer Paternal Uncle   . Prostate cancer Paternal Uncle   . Sleep apnea Brother   . Stroke Neg Hx    Allergies  Allergen Reactions  . Metformin And Related Diarrhea   Prior to Admission medications   Medication Sig Start Date End Date Taking? Authorizing Provider  albuterol (VENTOLIN HFA) 108 (90 Base) MCG/ACT inhaler Inhale 1-2 puffs into the lungs every 6 (six) hours as needed for wheezing or shortness of breath. 11/17/20   Hazel Sams, PA-C  amLODipine (NORVASC) 5 MG tablet Take 1 tablet (5 mg total) by mouth daily. 12/16/19 12/15/20  Biagio Borg, MD  atorvastatin (LIPITOR) 10 MG tablet Take 1 tablet by  mouth once daily 03/29/20   Biagio Borg, MD  beclomethasone (QVAR REDIHALER) 80 MCG/ACT inhaler Inhale 2 puffs into the lungs 2 (two) times daily for 14 days. 11/17/20 12/01/20  Hazel Sams, PA-C  Blood Glucose Monitoring Suppl [facility] VERIO) w/Device KIT Use as directed once daily E11.9 01/31/20   Biagio Borg, MD  glipiZIDE (GLUCOTROL XL) 10 MG 24 hr tablet Take 1 tablet (10 mg total) by mouth daily with breakfast. 09/07/20   Biagio Borg, MD  glucose blood (ACCU-CHEK GUIDE) test strip Use to check blood sugars twice a day 03/23/20   Biagio Borg, MD  ibuprofen (ADVIL) 800 MG tablet Take 1 tablet (800 mg total) by mouth every 8 (eight) hours as needed for moderate pain. 12/16/19   Biagio Borg, MD  insulin NPH Human (NOVOLIN N) 100 UNIT/ML injection Inject 0.5 mLs (50 Units total) into the skin every morning. And syringes 1/day 09/07/20   Biagio Borg, MD  Lancets MISC Use as directed once daily E11.9 01/31/20   Biagio Borg, MD  pantoprazole (PROTONIX) 40 MG tablet Take 1 tablet (40 mg total) by mouth 2 (two) times daily. 12/16/19   Biagio Borg, MD     Positive ROS: Otherwise negative  All other systems have been reviewed and were otherwise negative with the exception of those mentioned in the HPI and as above.  Physical Exam: Constitutional: Alert, well-appearing, no acute distress Ears: External ears without lesions or tenderness. Ear canals are clear bilaterally with intact, clear TMs.  Nasal: External nose without lesions. Septum midline with mild rhinitis. Clear nasal passages Oral: Lips and gums without lesions. Tongue and palate mucosa without lesions. Posterior oropharynx clear.  On exam of the floor mouth he has no swelling today.  Drainage from the submandibular duct is clear.  However on bimanual palpation he has slight discomfort of the left submandibular gland which is slightly enlarged.  I cannot palpate any definitive stone.  There is no purulent discharge from the duct  presently Neck: No palpable adenopathy or masses.  The area they describe the swelling represents the region of the submandibular gland.  He has no palpation of enlarged lymph nodes.  The gland is slightly tender to bimanual palpation. Respiratory: Breathing comfortably  Skin: No facial/neck lesions or rash noted.  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  Plan: I discussed with him concerning drinking plenty of liquids and using sialagogues for the next few weeks.  If he develops any pain or increased swelling I prescribed Keflex 500 mg 3  times daily for 1 week. He will follow-up as needed any further swelling or problems.   Radene Journey, MD   CC:

## 2021-01-11 ENCOUNTER — Encounter: Payer: Self-pay | Admitting: Internal Medicine

## 2021-01-12 ENCOUNTER — Other Ambulatory Visit: Payer: Self-pay | Admitting: Endocrinology

## 2021-01-12 MED ORDER — CLOTRIMAZOLE-BETAMETHASONE 1-0.05 % EX CREA: 1.0000 "application " | TOPICAL_CREAM | Freq: Every day | CUTANEOUS | 0 refills | Status: AC

## 2021-01-17 ENCOUNTER — Encounter: Payer: Self-pay | Admitting: Internal Medicine

## 2021-01-17 MED ORDER — PANTOPRAZOLE SODIUM 40 MG PO TBEC: 40.0000 mg | DELAYED_RELEASE_TABLET | Freq: Two times a day (BID) | ORAL | 2 refills | Status: DC

## 2021-02-04 ENCOUNTER — Other Ambulatory Visit: Payer: Self-pay | Admitting: Endocrinology

## 2021-03-22 ENCOUNTER — Other Ambulatory Visit: Payer: Self-pay

## 2021-03-22 ENCOUNTER — Ambulatory Visit (INDEPENDENT_AMBULATORY_CARE_PROVIDER_SITE_OTHER): Payer: No Typology Code available for payment source | Admitting: Internal Medicine

## 2021-03-22 ENCOUNTER — Encounter: Payer: Self-pay | Admitting: Internal Medicine

## 2021-03-22 VITALS — BP 152/96 | HR 92 | Temp 98.3°F | Ht 69.0 in | Wt 396.0 lb

## 2021-03-22 DIAGNOSIS — E7800 Pure hypercholesterolemia, unspecified: Secondary | ICD-10-CM | POA: Diagnosis not present

## 2021-03-22 DIAGNOSIS — E119 Type 2 diabetes mellitus without complications: Secondary | ICD-10-CM | POA: Diagnosis not present

## 2021-03-22 DIAGNOSIS — K219 Gastro-esophageal reflux disease without esophagitis: Secondary | ICD-10-CM

## 2021-03-22 DIAGNOSIS — I10 Essential (primary) hypertension: Secondary | ICD-10-CM

## 2021-03-22 DIAGNOSIS — F17200 Nicotine dependence, unspecified, uncomplicated: Secondary | ICD-10-CM

## 2021-03-22 MED ORDER — AMLODIPINE BESYLATE 5 MG PO TABS: 5.0000 mg | ORAL_TABLET | Freq: Every day | ORAL | 3 refills | Status: DC

## 2021-03-22 MED ORDER — FREESTYLE LIBRE SENSOR SYSTEM MISC: 3 refills | Status: DC

## 2021-03-22 MED ORDER — PANTOPRAZOLE SODIUM 40 MG PO TBEC: 40.0000 mg | DELAYED_RELEASE_TABLET | Freq: Two times a day (BID) | ORAL | 3 refills | Status: DC

## 2021-03-22 MED ORDER — INSULIN NPH (HUMAN) (ISOPHANE) 100 UNIT/ML ~~LOC~~ SUSP: SUBCUTANEOUS | 1 refills | Status: DC

## 2021-03-22 MED ORDER — GLIPIZIDE ER 10 MG PO TB24: 10.0000 mg | ORAL_TABLET | Freq: Every day | ORAL | 3 refills | Status: DC

## 2021-03-22 MED ORDER — ATORVASTATIN CALCIUM 10 MG PO TABS: 10.0000 mg | ORAL_TABLET | Freq: Every day | ORAL | 3 refills | Status: DC

## 2021-03-22 NOTE — Progress Notes (Signed)
Patient ID: Caleb [name], male   DOB: Apr 09, 1981, 40 y.o.   MRN: [id number]        Chief Complaint: follow up HTN, HLD and hyperglycemia        HPI:  Caleb [name] is a 40 y.o. male here with c/o low funds and hard to afford meds, has essentialy not taken any meds for 3 wks.  Pt denies chest pain, increased sob or doe, wheezing, orthopnea, PND, increased LE swelling, palpitations, dizziness or syncope.   Pt denies polydipsia, polyuria, or new focal neuro s/s.   Pt denies fever, wt loss, night sweats, loss of appetite, or other constitutional symptoms  Has not seen endo recently but plans to f/u.  No other new complaints except has been out of PPI as well with mild worsening reflux, but o/w Denies worsening abd pain, n/v, bowel change or blood. 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 Mild enlargement of pulmonary artery by CT 11/01/13.   GERD (gastroesophageal reflux disease)    HTN (hypertension)    Hx of echocardiogram    Echo (12/14/13):  Mod LVH, EF 50-55%, no RWMA, trivial Eff.   LV dysfunction    a. 10/2013:  EF 45-50% with followup echo 55-60%.   Morbid obesity ([place])    Pericarditis    a. 10/2013: Acute pericarditis with cardiac tamponade s/p pericardiocentesis.   Sleep apnea    Type 2 diabetes mellitus without complication, without long-term current use of insulin ([place]) 01/02/2017   Past Surgical History:  Procedure Laterality Date   PERICARDIAL TAP N/A 11/02/2013   Procedure: PERICARDIAL TAP;  Surgeon: Troy Sine, MD;  Location: [facility] CATH LAB;  Service: Cardiovascular;  Laterality: N/A;    reports that he has been smoking cigars and cigarettes. He started  smoking about 5 years ago. He has never used smokeless tobacco. He reports current alcohol use. He reports that he does not use drugs. family history includes Breast cancer in his maternal grandmother; CAD in an other family member; Colon cancer in his paternal grandmother and paternal uncle; Congestive Heart Failure in his maternal grandfather; Diabetes in his brother, father, maternal grandfather, maternal grandmother, and mother; Healthy in his paternal grandfather; Heart attack in his father; Heart disease in his mother; Heart failure in his father; Hyperlipidemia in his maternal grandfather, maternal grandmother, and mother; Hypertension in his maternal grandmother and mother; Kidney cancer in his father; Kidney disease in his mother; Non-Hodgkin's lymphoma in his father; Prostate cancer in his paternal uncle; Sleep apnea in his brother. Allergies  Allergen Reactions   Metformin And Related Diarrhea   Current Outpatient Medications on File Prior to Visit  Medication Sig Dispense Refill   albuterol (VENTOLIN HFA) 108 (90 Base) MCG/ACT inhaler Inhale 1-2 puffs into the lungs every 6 (six) hours as needed for wheezing or shortness of breath. 1 each 0   Blood Glucose Monitoring Suppl (ONETOUCH VERIO) w/Device KIT Use as directed once daily E11.9 1 kit 0   clotrimazole-betamethasone (LOTRISONE) cream Apply 1 application topically daily. 30 g 0   glucose blood (ACCU-CHEK GUIDE)  test strip Use to check blood sugars twice a day 100 each 3   ibuprofen (ADVIL) 800 MG tablet Take 1 tablet (800 mg total) by mouth every 8 (eight) hours as needed for moderate pain. 50 tablet 0   Lancets MISC Use as directed once daily E11.9 100 each 12   No current facility-administered medications on file prior to visit.        ROS:  All others reviewed and negative.  Objective        PE:  BP (!) 152/96 (BP Location: Left Arm, Patient Position: Sitting, Cuff Size: Large)   Pulse 92   Temp 98.3 F (36.8 C) (Oral)    Ht _0  (1.753 m)   Wt (!) 396 lb (179.6 kg)   SpO2 97%   BMI 58.48 kg/m                 Constitutional: Pt appears in NAD               HENT: Head: NCAT.                Right Ear: External ear normal.                 Left Ear: External ear normal.                Eyes: . Pupils are equal, round, and reactive to light. Conjunctivae and EOM are normal               Nose: without d/c or deformity               Neck: Neck supple. Gross normal ROM               Cardiovascular: Normal rate and regular rhythm.                 Pulmonary/Chest: Effort normal and breath sounds without rales or wheezing.                Abd:  Soft, NT, ND, + BS, no organomegaly               Neurological: Pt is alert. At baseline orientation, motor grossly intact               Skin: Skin is warm. No rashes, no other new lesions, LE edema - none               Psychiatric: Pt behavior is normal without agitation   Micro: none  Cardiac tracings I have personally interpreted today:  none  Pertinent Radiological findings (summarize): none   Lab Results  Component Value Date   WBC 6.0 09/21/2020   HGB 13.9 09/21/2020   HCT 43.2 09/21/2020   PLT 214.0 09/21/2020   GLUCOSE 294 (H) 03/22/2021   CHOL 192 03/22/2021   TRIG 231.0 (H) 03/22/2021   HDL 52.40 03/22/2021   LDLDIRECT 112.0 03/22/2021   LDLCALC 69 09/21/2020   ALT 24 03/22/2021   AST 19 03/22/2021   NA 133 (L) 03/22/2021   K 4.1 03/22/2021   CL 99 03/22/2021   CREATININE 0.95 03/22/2021   BUN 13 03/22/2021   CO2 27 03/22/2021   TSH 1.03 09/21/2020   PSA 0.32 12/20/2019   INR 1.0 07/06/2020   HGBA1C 11.3 (H) 03/22/2021   MICROALBUR 4.6 (H) 09/21/2020   Assessment/Plan:  Caleb [name] is a 40 y.o. 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  Type 2 diabetes mellitus ([facility]) Lab Results  Component Value Date   HGBA1C 11.3 (H) 03/22/2021   Severe uncontrolled, pt to restart current medical treatment glpizide, NPH, and f/u endo as planned   Essential hypertension BP Readings from Last 3 Encounters:  03/22/21 (!) 152/96  11/17/20 (!) 169/112  10/30/20 (!) 144/98   Uncontrolled with admitted non compliance pt to restart medical treatment norvasc, and f/p BP at home and next visit   HLD (hyperlipidemia) Lab Results  Component Value Date   LDLCALC 69 09/21/2020   Stable, pt to continue current statin lipitor  GERD (gastroesophageal reflux disease) Overall stable, but to restart PPI  Smoker Pt counseld to quit, pt not ready  Followup: No follow-ups on file.  Cathlean Cower, MD 03/26/2021 9:25 PM [facility]

## 2021-03-22 NOTE — Patient Instructions (Addendum)
Please restart and take all medications as prescribed  Please look into [employer] website for less expensive medication prescriptions  Please continue all other medications as before, and refills have been done if requested.  Please have the pharmacy call with any other refills you may need.  Please continue your efforts at being more active, low cholesterol diet, and weight control.  Please keep your appointments with your specialists as you may have planned - Dr Everardo All  Please go to the LAB at the blood drawing area for the tests to be done  You will be contacted by phone if any changes need to be made immediately.  Otherwise, you will receive a letter about your results with an explanation, but please check with MyChart first.  Please remember to sign up for MyChart if you have not done so, as this will be important to you in the future with finding out test results, communicating by private email, and scheduling acute appointments online when needed.  Please make an Appointment to return in 6 months, or sooner if needed

## 2021-03-23 LAB — HEPATIC FUNCTION PANEL
ALT: 24 U/L (ref 0–53)
AST: 19 U/L (ref 0–37)
Albumin: 4.2 g/dL (ref 3.5–5.2)
Alkaline Phosphatase: 50 U/L (ref 39–117)
Bilirubin, Direct: 0.1 mg/dL (ref 0.0–0.3)
Total Bilirubin: 0.5 mg/dL (ref 0.2–1.2)
Total Protein: 7.5 g/dL (ref 6.0–8.3)

## 2021-03-23 LAB — BASIC METABOLIC PANEL
BUN: 13 mg/dL (ref 6–23)
CO2: 27 mEq/L (ref 19–32)
Calcium: 8.9 mg/dL (ref 8.4–10.5)
Chloride: 99 mEq/L (ref 96–112)
Creatinine, Ser: 0.95 mg/dL (ref 0.40–1.50)
GFR: 102.14 mL/min (ref >60.00)
Glucose, Bld: 294 mg/dL — ABNORMAL HIGH (ref 70–99)
Potassium: 4.1 mEq/L (ref 3.5–5.1)
Sodium: 133 mEq/L — ABNORMAL LOW (ref 135–145)

## 2021-03-23 LAB — HEMOGLOBIN A1C: Hgb A1c MFr Bld: 11.3 % — ABNORMAL HIGH (ref 4.6–6.5)

## 2021-03-23 LAB — LIPID PANEL
Cholesterol: 192 mg/dL (ref 0–200)
HDL: 52.4 mg/dL (ref >39.00)
NonHDL: 139.52
Total CHOL/HDL Ratio: 4
Triglycerides: 231 mg/dL — ABNORMAL HIGH (ref 0.0–149.0)
VLDL: 46.2 mg/dL — ABNORMAL HIGH (ref 0.0–40.0)

## 2021-03-23 LAB — LDL CHOLESTEROL, DIRECT: Direct LDL: 112 mg/dL

## 2021-03-24 ENCOUNTER — Other Ambulatory Visit: Payer: Self-pay | Admitting: Internal Medicine

## 2021-03-24 ENCOUNTER — Encounter: Payer: Self-pay | Admitting: Internal Medicine

## 2021-03-24 MED ORDER — INSULIN NPH (HUMAN) (ISOPHANE) 100 UNIT/ML ~~LOC~~ SUSP: SUBCUTANEOUS | 2 refills | Status: DC

## 2021-03-24 MED ORDER — ATORVASTATIN CALCIUM 20 MG PO TABS: 20.0000 mg | ORAL_TABLET | Freq: Every day | ORAL | 3 refills | Status: DC

## 2021-03-26 ENCOUNTER — Encounter: Payer: Self-pay | Admitting: Internal Medicine

## 2021-03-26 DIAGNOSIS — F17200 Nicotine dependence, unspecified, uncomplicated: Secondary | ICD-10-CM | POA: Insufficient documentation

## 2021-03-26 DIAGNOSIS — E785 Hyperlipidemia, unspecified: Secondary | ICD-10-CM | POA: Insufficient documentation

## 2021-03-26 NOTE — Assessment & Plan Note (Signed)
Lab Results  Component Value Date   LDLCALC 69 09/21/2020   Stable, pt to continue current statin lipitor

## 2021-03-26 NOTE — Assessment & Plan Note (Signed)
BP Readings from Last 3 Encounters:  03/22/21 (!) 152/96  11/17/20 (!) 169/112  10/30/20 (!) 144/98   Uncontrolled with admitted non compliance pt to restart medical treatment norvasc, and f/p BP at home and next visit

## 2021-03-26 NOTE — Assessment & Plan Note (Signed)
Lab Results  Component Value Date   HGBA1C 11.3 (H) 03/22/2021   Severe uncontrolled, pt to restart current medical treatment glpizide, NPH, and f/u endo as planned

## 2021-03-26 NOTE — Assessment & Plan Note (Signed)
Pt counseld to quit, pt not ready 

## 2021-03-26 NOTE — Assessment & Plan Note (Signed)
Overall stable, but to restart PPI

## 2021-04-03 ENCOUNTER — Encounter: Payer: Self-pay | Admitting: Physician Assistant

## 2021-04-03 ENCOUNTER — Encounter: Payer: Self-pay | Admitting: Internal Medicine

## 2021-05-08 ENCOUNTER — Encounter: Payer: Self-pay | Admitting: Internal Medicine

## 2021-05-09 MED ORDER — SILDENAFIL CITRATE 100 MG PO TABS: 100.0000 mg | ORAL_TABLET | Freq: Every day | ORAL | 5 refills | Status: DC | PRN

## 2021-05-09 NOTE — Telephone Encounter (Signed)
Ok done erx to [employer] in [place]

## 2021-09-20 ENCOUNTER — Encounter: Payer: Self-pay | Admitting: Internal Medicine

## 2021-09-22 ENCOUNTER — Emergency Department (HOSPITAL_COMMUNITY)
Admission: EM | Admit: 2021-09-22 | Discharge: 2021-09-22 | Disposition: A | Discharge status: Home / Self Care | Payer: BC Managed Care – PPO | Attending: Emergency Medicine | Admitting: Emergency Medicine

## 2021-09-22 ENCOUNTER — Encounter (HOSPITAL_COMMUNITY): Payer: Self-pay

## 2021-09-22 DIAGNOSIS — J069 Acute upper respiratory infection, unspecified: Secondary | ICD-10-CM | POA: Diagnosis not present

## 2021-09-22 DIAGNOSIS — R0602 Shortness of breath: Secondary | ICD-10-CM | POA: Diagnosis present

## 2021-09-22 DIAGNOSIS — Z794 Long term (current) use of insulin: Secondary | ICD-10-CM | POA: Insufficient documentation

## 2021-09-22 DIAGNOSIS — J4521 Mild intermittent asthma with (acute) exacerbation: Secondary | ICD-10-CM | POA: Diagnosis not present

## 2021-09-22 DIAGNOSIS — F17290 Nicotine dependence, other tobacco product, uncomplicated: Secondary | ICD-10-CM | POA: Insufficient documentation

## 2021-09-22 DIAGNOSIS — Z7951 Long term (current) use of inhaled steroids: Secondary | ICD-10-CM | POA: Insufficient documentation

## 2021-09-22 DIAGNOSIS — Z20822 Contact with and (suspected) exposure to covid-19: Secondary | ICD-10-CM | POA: Insufficient documentation

## 2021-09-22 LAB — RESP PANEL BY RT-PCR (FLU A&B, COVID) ARPGX2
Influenza A by PCR: NEGATIVE
Influenza B by PCR: NEGATIVE
SARS Coronavirus 2 by RT PCR: NEGATIVE

## 2021-09-22 MED ORDER — IPRATROPIUM-ALBUTEROL 0.5-2.5 (3) MG/3ML IN SOLN
3.0000 mL | Freq: Once | RESPIRATORY_TRACT | Status: AC
  Administered 2021-09-22: 3 mL via RESPIRATORY_TRACT
  Filled 2021-09-22: qty 3

## 2021-09-22 MED ORDER — ALBUTEROL SULFATE HFA 108 (90 BASE) MCG/ACT IN AERS: 1.0000 | INHALATION_SPRAY | Freq: Four times a day (QID) | RESPIRATORY_TRACT | 1 refills | Status: AC | PRN

## 2021-09-22 MED ORDER — PREDNISONE 50 MG PO TABS: 50.0000 mg | ORAL_TABLET | Freq: Every day | ORAL | 0 refills | Status: DC

## 2021-09-22 NOTE — ED Provider Notes (Signed)
[place] DEPT Provider Note   CSN: [id number] Arrival date & time: 09/22/21  1300     History  Chief Complaint  Patient presents with   Cough    Caleb [name] is a 41 y.o. male with a past medical history significant for remote history of asthma who presents with one week of cough, wheezing, SOB, congestion, lack of appetite. Has been taking mucinex, dayquil with minimal relief. Denies chest pain, nausea, vomiting, diarrhea. Does not have inhaler at home. No fever, chills. Does endorse tobacco use, smoking 1 cigar every 3-4 days.   Cough Associated symptoms: shortness of breath and wheezing       Home Medications Prior to Admission medications   Medication Sig Start Date End Date Taking? Authorizing Provider  albuterol (VENTOLIN HFA) 108 (90 Base) MCG/ACT inhaler Inhale 1-2 puffs into the lungs every 6 (six) hours as needed for wheezing or shortness of breath. 09/22/21  Yes Laural Eiland H, PA-C  predniSONE (DELTASONE) 50 MG tablet Take 1 tablet (50 mg total) by mouth daily. 09/22/21  Yes Leary Mcnulty H, PA-C  amLODipine (NORVASC) 5 MG tablet Take 1 tablet (5 mg total) by mouth daily. 03/22/21 03/22/22  Biagio Borg, MD  atorvastatin (LIPITOR) 20 MG tablet Take 1 tablet (20 mg total) by mouth daily. 03/24/21 03/24/22  Biagio Borg, MD  Blood Glucose Monitoring Suppl [facility] VERIO) w/Device KIT Use as directed once daily E11.9 01/31/20   Biagio Borg, MD  clotrimazole-betamethasone (LOTRISONE) cream Apply 1 application topically daily. 01/12/21   Biagio Borg, MD  Continuous Blood Gluc Sensor ([place]) MISC Use as directed one every 14 days E11.9 03/22/21   Biagio Borg, MD  glipiZIDE (GLUCOTROL XL) 10 MG 24 hr tablet Take 1 tablet (10 mg total) by mouth daily with breakfast. 03/22/21   Biagio Borg, MD  glucose blood (ACCU-CHEK GUIDE) test strip Use to check blood sugars twice a day 03/23/20   Biagio Borg, MD   ibuprofen (ADVIL) 800 MG tablet Take 1 tablet (800 mg total) by mouth every 8 (eight) hours as needed for moderate pain. 12/16/19   Biagio Borg, MD  insulin NPH Human (NOVOLIN N RELION) 100 UNIT/ML injection INJECT 40 UNITS SUBCUTANEOUSLY ONCE DAILY IN THE MORNING 03/24/21   Biagio Borg, MD  Lancets MISC Use as directed once daily E11.9 01/31/20   Biagio Borg, MD  pantoprazole (PROTONIX) 40 MG tablet Take 1 tablet (40 mg total) by mouth 2 (two) times daily. 03/22/21   Biagio Borg, MD  sildenafil (VIAGRA) 100 MG tablet Take 1 tablet (100 mg total) by mouth daily as needed for erectile dysfunction. 05/09/21   Biagio Borg, MD      Allergies    Metformin and related    Review of Systems   Review of Systems  Respiratory:  Positive for cough, shortness of breath and wheezing.   All other systems reviewed and are negative.  Physical Exam Updated Vital Signs BP (!) 167/108 (BP Location: Left Arm)    Pulse 83    Temp 98.7 F (37.1 C) (Oral)    Resp 16    SpO2 96%  Physical Exam Vitals and nursing note reviewed.  Constitutional:      General: He is not in acute distress.    Appearance: Normal appearance. He is obese.  HENT:     Head: Normocephalic and atraumatic.  Eyes:     General:  Right eye: No discharge.        Left eye: No discharge.  Cardiovascular:     Rate and Rhythm: Normal rate and regular rhythm.     Heart sounds: No murmur heard.   No friction rub. No gallop.  Pulmonary:     Effort: Pulmonary effort is normal.     Breath sounds: Normal breath sounds.     Comments: Diffuse biphasic wheezing throughout with good excursion overall. No accessory muscle use, no intercostal retractions, no stridor, no focal consolidation noted. Abdominal:     General: Bowel sounds are normal.     Palpations: Abdomen is soft.  Skin:    General: Skin is warm and dry.     Capillary Refill: Capillary refill takes less than 2 seconds.  Neurological:     Mental Status: He is alert and  oriented to person, place, and time.  Psychiatric:        Mood and Affect: Mood normal.        Behavior: Behavior normal.    ED Results / Procedures / Treatments   Labs (all labs ordered are listed, but only abnormal results are displayed) Labs Reviewed  RESP PANEL BY RT-PCR (FLU A&B, COVID) [id number]    EKG None  Radiology No results found.  Procedures Procedures    Medications Ordered in ED Medications  ipratropium-albuterol (DUONEB) 0.5-2.5 (3) MG/3ML nebulizer solution 3 mL (3 mLs Nebulization Given 09/22/21 1504)    ED Course/ Medical Decision Making/ A&P                           Medical Decision Making Risk Prescription drug management.   This is an overall well-appearing male with persistent cough, shortness of breath, wheezing for the last week. Also endorses congestion, lack of appetite. The emergent dfx diagnosis includes pneumonia, acute bronchitis, asthma exacerbation, acute hypoxic respiratory failure, less clinical concern for PE.  On exam, patient is obese, does have biphasic wheezing. He has no focal consolidation throughout both lung fields, is no in respiratory distress, good excursion throughout. Noted to have elevated systolic 426 on arrival, no chest pain, no headache. Encouraged follow up with PCP.  Given biphasic wheezing, we will administer DuoNeb and re-evaluate. Ordered COVID / flu swab to determine etiology, however patient's symptoms have been ongoing for one week do not believe this would change my management.  Significant improvement of wheezing with DuoNeb x 1. Still some scattered end-expiratory wheezing noted in lower lung fields.  Patient's symptoms are consistent with acute bronchitis vs. Asthma exacerbation. We will discharge with steroid burst, albuterol inhaler. Patient with no fever, focal consolidation, low clinical suspicion for acute PNA at this time. Patient discharged in stable condition, extensive return precautions given. Final  Clinical Impression(s) / ED Diagnoses Final diagnoses:  Mild intermittent asthma with exacerbation  Viral URI with cough    Rx / DC Orders ED Discharge Orders          Ordered    predniSONE (DELTASONE) 50 MG tablet  Daily        09/22/21 1526    albuterol (VENTOLIN HFA) 108 (90 Base) MCG/ACT inhaler  Every 6 hours PRN        09/22/21 1526              Zeba Luby, [place], PA-C 09/22/21 1536    Isla Pence, MD 09/23/21 [phone]

## 2021-09-22 NOTE — Discharge Instructions (Addendum)
As we discussed, your symptoms are consistent with an asthma exacerbation brought on by an upper respiratory infection. I encourage plenty of rest, fluids, tylenol and ibuprofen for chills / body aches. Please take the entire course of steroids I have prescribed. You can use your inhaler as needed for wheezing. Please return if your shortness of breath worsens, you develop chest pain.

## 2021-09-22 NOTE — ED Triage Notes (Signed)
Pt arrived via POV, c/o cough, nasal congestion/drainage x1 wk. No known sick contacts.

## 2021-09-22 NOTE — ED Provider Triage Note (Signed)
Emergency Medicine Provider Triage Evaluation Note  Caleb [name] , a 41 y.o. male  was evaluated in triage.  Pt complains of cough, wheezing, sob, congestion, lack of appetite for around a week. Hx of asthma, but hasn't had issue with it in years, does not use inhaler at home. Does smoke cigars, reports 1 cigar over 3-4 days. No nausea, vomiting, fever, chest pain.  Review of Systems  Positive: Shob, cough, wheezing Negative: Chest pain, nausea, vomiting, fever  Physical Exam  BP (!) 167/108 (BP Location: Left Arm)    Pulse 83    Temp 98.7 F (37.1 C) (Oral)    Resp 16    SpO2 96%  Gen:   Awake, no distress   Resp:  Patient with biphasic wheezing bilaterally with overall good excursion, no focal consolidation noted MSK:   Moves extremities without difficulty  Other:  No ttp abdomen, chest wall  Medical Decision Making  Medically screening exam initiated at 1:56 PM.  Appropriate orders placed.  Caleb [name] was informed that the remainder of the evaluation will be completed by another provider, this initial triage assessment does not replace that evaluation, and the importance of remaining in the ED until their evaluation is complete.  Workup initiated   Olene Floss, [place] 09/22/21 1358

## 2021-09-27 ENCOUNTER — Ambulatory Visit (INDEPENDENT_AMBULATORY_CARE_PROVIDER_SITE_OTHER)
Admission: RE | Admit: 2021-09-27 | Discharge: 2021-09-27 | Disposition: A | Discharge status: Home / Self Care | Payer: BC Managed Care – PPO | Source: Ambulatory Visit | Attending: Internal Medicine | Admitting: Internal Medicine

## 2021-09-27 ENCOUNTER — Ambulatory Visit (INDEPENDENT_AMBULATORY_CARE_PROVIDER_SITE_OTHER): Payer: BC Managed Care – PPO | Admitting: Internal Medicine

## 2021-09-27 ENCOUNTER — Other Ambulatory Visit: Payer: Self-pay

## 2021-09-27 ENCOUNTER — Encounter: Payer: Self-pay | Admitting: Internal Medicine

## 2021-09-27 ENCOUNTER — Other Ambulatory Visit: Payer: BC Managed Care – PPO

## 2021-09-27 VITALS — BP 150/100 | HR 82 | Temp 98.0°F | Ht 69.0 in | Wt 390.0 lb

## 2021-09-27 DIAGNOSIS — E7800 Pure hypercholesterolemia, unspecified: Secondary | ICD-10-CM | POA: Diagnosis not present

## 2021-09-27 DIAGNOSIS — Z794 Long term (current) use of insulin: Secondary | ICD-10-CM

## 2021-09-27 DIAGNOSIS — R051 Acute cough: Secondary | ICD-10-CM

## 2021-09-27 DIAGNOSIS — E1165 Type 2 diabetes mellitus with hyperglycemia: Secondary | ICD-10-CM | POA: Diagnosis not present

## 2021-09-27 DIAGNOSIS — R059 Cough, unspecified: Secondary | ICD-10-CM | POA: Insufficient documentation

## 2021-09-27 DIAGNOSIS — Z0001 Encounter for general adult medical examination with abnormal findings: Secondary | ICD-10-CM | POA: Diagnosis not present

## 2021-09-27 DIAGNOSIS — E538 Deficiency of other specified B group vitamins: Secondary | ICD-10-CM

## 2021-09-27 DIAGNOSIS — Z1159 Encounter for screening for other viral diseases: Secondary | ICD-10-CM

## 2021-09-27 DIAGNOSIS — I10 Essential (primary) hypertension: Secondary | ICD-10-CM

## 2021-09-27 DIAGNOSIS — F17200 Nicotine dependence, unspecified, uncomplicated: Secondary | ICD-10-CM | POA: Diagnosis not present

## 2021-09-27 DIAGNOSIS — E559 Vitamin D deficiency, unspecified: Secondary | ICD-10-CM

## 2021-09-27 MED ORDER — GLIPIZIDE ER 10 MG PO TB24: 10.0000 mg | ORAL_TABLET | Freq: Every day | ORAL | 3 refills | Status: DC

## 2021-09-27 MED ORDER — AMLODIPINE BESYLATE 5 MG PO TABS: 5.0000 mg | ORAL_TABLET | Freq: Every day | ORAL | 3 refills | Status: DC

## 2021-09-27 MED ORDER — ATORVASTATIN CALCIUM 20 MG PO TABS: 20.0000 mg | ORAL_TABLET | Freq: Every day | ORAL | 3 refills | Status: DC

## 2021-09-27 MED ORDER — INSULIN NPH (HUMAN) (ISOPHANE) 100 UNIT/ML ~~LOC~~ SUSP: SUBCUTANEOUS | 5 refills | Status: DC

## 2021-09-27 MED ORDER — PANTOPRAZOLE SODIUM 40 MG PO TBEC: 40.0000 mg | DELAYED_RELEASE_TABLET | Freq: Two times a day (BID) | ORAL | 3 refills | Status: DC

## 2021-09-27 MED ORDER — HYDROCODONE BIT-HOMATROP MBR 5-1.5 MG/5ML PO SOLN: 5.0000 mL | Freq: Four times a day (QID) | ORAL | 0 refills | Status: AC | PRN

## 2021-09-27 NOTE — Progress Notes (Signed)
Patient ID: MATYAS BAISLEY, male   DOB: 08/22/1980, 41 y.o.   MRN: [id number]         Chief Complaint:: wellness exam and Office Visit (6 month f/u -- patient c/o still having symptoms of bronchitis)  And wheeze, dm, elevated bp       HPI:  URIE LOUGHNER is a 41 y.o. male here for wellness exam; declines flu shot, o/w up to date                        Also had recent onset 5 days cough wheeze, sob doe s/p prednisone and inhaler with some improved, then worsening again in last 2 days.  Hs not taken BP med this am.  Pt denies chest pain, orthopnea, PND, increased LE swelling, palpitations, dizziness or syncope.   Pt denies polydipsia, polyuria, or new focal neuro s/s.   Pt denies fever, wt loss, night sweats, loss of appetite, or other constitutional symptoms       Wt Readings from Last 3 Encounters:  09/27/21 (!) 390 lb (176.9 kg)  03/22/21 (!) 396 lb (179.6 kg)  10/30/20 (!) 404 lb 6 oz (183.4 kg)   BP Readings from Last 3 Encounters:  09/27/21 (!) 150/100  09/22/21 (!) 167/108  03/22/21 (!) 152/96   Immunization History  Administered Date(s) Administered   Influenza,inj,Quad PF,6+ Mos 11/04/2013   Moderna Sars-Covid-2 Vaccination 12/04/2019, 01/01/2020   Tdap 01/06/2016, 11/21/2017   There are no preventive care reminders to display for this patient.     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 Mild enlargement of pulmonary artery by CT 11/01/13.   GERD (gastroesophageal reflux disease)    HTN (hypertension)    Hx of echocardiogram    Echo (12/14/13):  Mod LVH, EF 50-55%, no RWMA, trivial Eff.   LV dysfunction    a. 10/2013:  EF 45-50% with followup echo 55-60%.   Morbid obesity ([place])    Pericarditis    a. 10/2013: Acute pericarditis with cardiac tamponade s/p pericardiocentesis.   Sleep apnea    Type 2 diabetes mellitus without complication, without long-term current use of insulin ([place]) 01/02/2017   Past  Surgical History:  Procedure Laterality Date   PERICARDIAL TAP N/A 11/02/2013   Procedure: PERICARDIAL TAP;  Surgeon: Troy Sine, MD;  Location: [facility] CATH LAB;  Service: Cardiovascular;  Laterality: N/A;    reports that he has been smoking cigars and cigarettes. He started smoking about 6 years ago. He has never used smokeless tobacco. He reports current alcohol use. He reports that he does not use drugs. family history includes Breast cancer in his maternal grandmother; CAD in an other family member; Colon cancer in his paternal grandmother and paternal uncle; Congestive Heart Failure in his maternal grandfather; Diabetes in his brother, father, maternal grandfather, maternal grandmother, and mother; Healthy in his paternal grandfather; Heart attack in his father; Heart disease in his mother; Heart failure in his father; Hyperlipidemia in his maternal grandfather, maternal grandmother, and mother; Hypertension in his maternal grandmother and mother; Kidney cancer in his father; Kidney disease in his mother; Non-Hodgkin's lymphoma in his father; Prostate cancer in his paternal uncle; Sleep apnea in his brother. Allergies  Allergen Reactions   Metformin And Related Diarrhea   Current Outpatient Medications on File Prior to Visit  Medication Sig Dispense Refill   albuterol (VENTOLIN HFA) 108 (90 Base)  MCG/ACT inhaler Inhale 1-2 puffs into the lungs every 6 (six) hours as needed for wheezing or shortness of breath. 8 g 1   Blood Glucose Monitoring Suppl (ONETOUCH VERIO) w/Device KIT Use as directed once daily E11.9 1 kit 0   clotrimazole-betamethasone (LOTRISONE) cream Apply 1 application topically daily. 30 g 0   Continuous Blood Gluc Sensor (FREESTYLE LIBRE SENSOR SYSTEM) MISC Use as directed one every 14 days E11.9 6 each 3   glucose blood (ACCU-CHEK GUIDE) test strip Use to check blood sugars twice a day 100 each 3   ibuprofen (ADVIL) 800 MG tablet Take 1 tablet (800 mg total) by mouth every 8  (eight) hours as needed for moderate pain. 50 tablet 0   Lancets MISC Use as directed once daily E11.9 100 each 12   sildenafil (VIAGRA) 100 MG tablet Take 1 tablet (100 mg total) by mouth daily as needed for erectile dysfunction. 10 tablet 5   No current facility-administered medications on file prior to visit.        ROS:  All others reviewed and negative.  Objective        PE:  BP (!) 150/100 (BP Location: Left Arm, Patient Position: Sitting, Cuff Size: Large)    Pulse 82    Temp 98 F (36.7 C) (Oral)    Ht '5\' 9"'  (1.753 m)    Wt (!) 390 lb (176.9 kg)    SpO2 98%    BMI 57.59 kg/m                 Constitutional: Pt appears in NAD               HENT: Head: NCAT.                Right Ear: External ear normal.                 Left Ear: External ear normal. Bilat tm's with mild erythema.  Max sinus areas non tender.  Pharynx with mild erythema, no exudate               Eyes: . Pupils are equal, round, and reactive to light. Conjunctivae and EOM are normal               Nose: without d/c or deformity               Neck: Neck supple. Gross normal ROM               Cardiovascular: Normal rate and regular rhythm.                 Pulmonary/Chest: Effort normal and breath sounds without rales but no wheezing.                Abd:  Soft, NT, ND, + BS, no organomegaly               Neurological: Pt is alert. At baseline orientation, motor grossly intact               Skin: Skin is warm. No rashes, no other new lesions, LE edema - none               Psychiatric: Pt behavior is normal without agitation   Micro: none  Cardiac tracings I have personally interpreted today:  none  Pertinent Radiological findings (summarize): none   Lab Results  Component Value Date   WBC 9.9 09/27/2021   HGB 13.3 09/27/2021  HCT 42.9 09/27/2021   PLT 301 09/27/2021   GLUCOSE 220 (H) 09/27/2021   CHOL 181 09/27/2021   TRIG 227 (H) 09/27/2021   HDL 51 09/27/2021   LDLDIRECT 112.0 03/22/2021   LDLCALC 96  09/27/2021   ALT 20 09/27/2021   AST 13 09/27/2021   NA 137 09/27/2021   K 4.0 09/27/2021   CL 102 09/27/2021   CREATININE 0.86 09/27/2021   BUN 19 09/27/2021   CO2 28 09/27/2021   TSH 2.53 09/27/2021   PSA 0.32 12/20/2019   INR 1.0 07/06/2020   HGBA1C 9.9 (H) 09/27/2021   MICROALBUR 2.3 09/27/2021   Assessment/Plan:  DYLANN GALLIER is a 41 y.o. 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  Encounter for well adult exam with abnormal findings Age and sex appropriate education and counseling updated with regular exercise and diet Referrals for preventative services - none needed Immunizations addressed - declines flu shot Smoking counseling  - counsleed to quit, pt not ready Evidence for depression or other mood disorder - none significant Most recent labs reviewed. I have personally reviewed and have noted: 1) the patient's medical and social history 2) The patient's current medications and supplements 3) The patient's height, weight, and BMI have been recorded in the chart   Essential hypertension BP Readings from Last 3 Encounters:  09/27/21 (!) 150/100  09/22/21 (!) 167/108  03/22/21 (!) 152/96   Uncontrolled, suspect non compliance, pt states out of med recently, pt to restart norvasc 5   HLD (hyperlipidemia) Lab Results  Component Value Date   LDLCALC 96 09/27/2021   Stable, pt to continue current statin lipitor 40, encouraged med compliance and low chol DM diet   Smoker counsled to quit smoking, pt not ready  Type 2 diabetes mellitus ([place]) Lab Results  Component Value Date   HGBA1C 9.9 (H) 09/27/2021   uncontrolled, pt  to continue current medical treatment glucotrol, NPH but add rybelsus, refer endo and DM education, encouraged compliance with tx   Cough Post bronchitis like illness, for cxr and cough med prn,  to f/u any worsening symptoms or concerns  Vitamin D deficiency Last vitamin D Lab Results  Component Value Date   VD25OH 11 (L) 09/27/2021   Low, to start oral replacement   Followup: Return in about 6 months (around 03/27/2022).  Cathlean Cower, MD 09/30/2021 7:28 PM [facility]

## 2021-09-27 NOTE — Patient Instructions (Signed)
Please take all new medication as prescribed - the cough medicine as needed  Please continue all other medications as before, and refills have been done if requested.  Please have the pharmacy call with any other refills you may need.  Please continue your efforts at being more active, low cholesterol diet, and weight control.  You are otherwise up to date with prevention measures today.  Please keep your appointments with your specialists as you may have planned  Please go to the XRAY Department in the first floor for the x-ray testing - at the [facility] xray tonight  Please go to the LAB at the blood drawing area for the tests to be done - at the [facility] today  You will be contacted by phone if any changes need to be made immediately.  Otherwise, you will receive a letter about your results with an explanation, but please check with MyChart first.  Please remember to sign up for MyChart if you have not done so, as this will be important to you in the future with finding out test results, communicating by private email, and scheduling acute appointments online when needed.  Please make an Appointment to return in 6 months, or sooner if needed

## 2021-09-28 ENCOUNTER — Telehealth: Payer: Self-pay

## 2021-09-28 ENCOUNTER — Other Ambulatory Visit: Payer: Self-pay | Admitting: Internal Medicine

## 2021-09-28 DIAGNOSIS — E1165 Type 2 diabetes mellitus with hyperglycemia: Secondary | ICD-10-CM

## 2021-09-28 DIAGNOSIS — Z794 Long term (current) use of insulin: Secondary | ICD-10-CM

## 2021-09-28 LAB — CBC WITH DIFFERENTIAL/PLATELET
Absolute Monocytes: 505 cells/uL (ref 200–950)
Basophils Absolute: 40 cells/uL (ref 0–200)
Basophils Relative: 0.4 %
Eosinophils Absolute: 59 cells/uL (ref 15–500)
Eosinophils Relative: 0.6 %
HCT: 42.9 % (ref 38.5–50.0)
Hemoglobin: 13.3 g/dL (ref 13.2–17.1)
Lymphs Abs: 5841 cells/uL — ABNORMAL HIGH (ref 850–3900)
MCH: 23.1 pg — ABNORMAL LOW (ref 27.0–33.0)
MCHC: 31 g/dL — ABNORMAL LOW (ref 32.0–36.0)
MCV: 74.4 fL — ABNORMAL LOW (ref 80.0–100.0)
MPV: 12 fL (ref 7.5–12.5)
Monocytes Relative: 5.1 %
Neutro Abs: 3455 cells/uL (ref 1500–7800)
Neutrophils Relative %: 34.9 %
Platelets: 301 10*3/uL (ref 140–400)
RBC: 5.77 10*6/uL (ref 4.20–5.80)
RDW: 14.5 % (ref 11.0–15.0)
Total Lymphocyte: 59 %
WBC: 9.9 10*3/uL (ref 3.8–10.8)

## 2021-09-28 LAB — HEPATIC FUNCTION PANEL
AG Ratio: 1.4 (calc) (ref 1.0–2.5)
ALT: 20 U/L (ref 9–46)
AST: 13 U/L (ref 10–40)
Albumin: 4.2 g/dL (ref 3.6–5.1)
Alkaline phosphatase (APISO): 45 U/L (ref 36–130)
Bilirubin, Direct: 0.1 mg/dL (ref 0.0–0.2)
Globulin: 3.1 g/dL (calc) (ref 1.9–3.7)
Indirect Bilirubin: 0.4 mg/dL (calc) (ref 0.2–1.2)
Total Bilirubin: 0.5 mg/dL (ref 0.2–1.2)
Total Protein: 7.3 g/dL (ref 6.1–8.1)

## 2021-09-28 LAB — VITAMIN B12: Vitamin B-12: 1071 pg/mL (ref 200–1100)

## 2021-09-28 LAB — BASIC METABOLIC PANEL
BUN: 19 mg/dL (ref 7–25)
CO2: 28 mmol/L (ref 20–32)
Calcium: 8.9 mg/dL (ref 8.6–10.3)
Chloride: 102 mmol/L (ref 98–110)
Creat: 0.86 mg/dL (ref 0.60–1.26)
Glucose, Bld: 220 mg/dL — ABNORMAL HIGH (ref 65–99)
Potassium: 4 mmol/L (ref 3.5–5.3)
Sodium: 137 mmol/L (ref 135–146)

## 2021-09-28 LAB — URINALYSIS, ROUTINE W REFLEX MICROSCOPIC
Bilirubin Urine: NEGATIVE
Hgb urine dipstick: NEGATIVE
Ketones, ur: NEGATIVE
Leukocytes,Ua: NEGATIVE
Nitrite: NEGATIVE
Protein, ur: NEGATIVE
Specific Gravity, Urine: 1.029 (ref 1.001–1.035)
pH: 5.5 (ref 5.0–8.0)

## 2021-09-28 LAB — LIPID PANEL
Cholesterol: 181 mg/dL (ref <200)
HDL: 51 mg/dL (ref >=40)
LDL Cholesterol (Calc): 96 mg/dL (calc)
Non-HDL Cholesterol (Calc): 130 mg/dL (calc) — ABNORMAL HIGH (ref <130)
Total CHOL/HDL Ratio: 3.5 (calc) (ref <5.0)
Triglycerides: 227 mg/dL — ABNORMAL HIGH (ref <150)

## 2021-09-28 LAB — EXTRA SPECIMEN

## 2021-09-28 LAB — TSH: TSH: 2.53 mIU/L (ref 0.40–4.50)

## 2021-09-28 LAB — MICROALBUMIN / CREATININE URINE RATIO
Creatinine, Urine: 178 mg/dL (ref 20–320)
Microalb Creat Ratio: 13 mcg/mg creat (ref <30)
Microalb, Ur: 2.3 mg/dL

## 2021-09-28 LAB — HEPATITIS C ANTIBODY
Hepatitis C Ab: NONREACTIVE
SIGNAL TO CUT-OFF: 0.03 (ref <1.00)

## 2021-09-28 LAB — HEMOGLOBIN A1C
Hgb A1c MFr Bld: 9.9 % of total Hgb — ABNORMAL HIGH (ref <5.7)
Mean Plasma Glucose: 237 mg/dL
eAG (mmol/L): 13.2 mmol/L

## 2021-09-28 LAB — VITAMIN D 25 HYDROXY (VIT D DEFICIENCY, FRACTURES): Vit D, 25-Hydroxy: 11 ng/mL — ABNORMAL LOW (ref 30–100)

## 2021-09-28 MED ORDER — CHOLECALCIFEROL 50 MCG (2000 UT) PO TABS: ORAL_TABLET | ORAL | 99 refills | Status: AC

## 2021-09-28 MED ORDER — RYBELSUS 3 MG PO TABS: 3.0000 mg | ORAL_TABLET | Freq: Every day | ORAL | 3 refills | Status: DC

## 2021-09-28 MED ORDER — ATORVASTATIN CALCIUM 40 MG PO TABS: 40.0000 mg | ORAL_TABLET | Freq: Every day | ORAL | 3 refills | Status: DC

## 2021-09-28 NOTE — Telephone Encounter (Signed)
Pt called back for results. I advised pt of Dr. Jonny Ruiz result notes test results show that your current treatment is OK, as the tests are stable , except the Vitamin D level is low, the LDL cholesterol is still higher than 70, and the A1c is still moderately high.  We should do a number of things:   1)  Please take OTC Vitamin D3 at 2000 units per day, indefinitely 2)  Increase the lipitor to 40 mg per day 3)  ADD a medication called rybelsus 3 mg per day to help with sugar and weight loss 4)   Refer to Endocrinology and Diabetes Education  referral x 2, and rx x 3   Pt expressed understanding and agreed to the following recommendations.

## 2021-09-30 ENCOUNTER — Encounter: Payer: Self-pay | Admitting: Internal Medicine

## 2021-09-30 DIAGNOSIS — E559 Vitamin D deficiency, unspecified: Secondary | ICD-10-CM | POA: Insufficient documentation

## 2021-09-30 NOTE — Assessment & Plan Note (Signed)
Age and sex appropriate education and counseling updated with regular exercise and diet Referrals for preventative services - none needed Immunizations addressed - declines flu shot Smoking counseling  - counsleed to quit, pt not ready Evidence for depression or other mood disorder - none significant Most recent labs reviewed. I have personally reviewed and have noted: 1) the patient's medical and social history 2) The patient's current medications and supplements 3) The patient's height, weight, and BMI have been recorded in the chart

## 2021-09-30 NOTE — Assessment & Plan Note (Signed)
Lab Results  Component Value Date   LDLCALC 96 09/27/2021   Stable, pt to continue current statin lipitor 40, encouraged med compliance and low chol DM diet

## 2021-09-30 NOTE — Assessment & Plan Note (Signed)
Post bronchitis like illness, for cxr and cough med prn,  to f/u any worsening symptoms or concerns

## 2021-09-30 NOTE — Assessment & Plan Note (Signed)
BP Readings from Last 3 Encounters:  09/27/21 (!) 150/100  09/22/21 (!) 167/108  03/22/21 (!) 152/96   Uncontrolled, suspect non compliance, pt states out of med recently, pt to restart norvasc 5

## 2021-09-30 NOTE — Assessment & Plan Note (Signed)
counsled to quit smoking, pt not ready

## 2021-09-30 NOTE — Assessment & Plan Note (Signed)
Last vitamin D Lab Results  Component Value Date   VD25OH 11 (L) 09/27/2021   Low, to start oral replacement

## 2021-09-30 NOTE — Assessment & Plan Note (Signed)
Lab Results  Component Value Date   HGBA1C 9.9 (H) 09/27/2021   uncontrolled, pt to continue current medical treatment glucotrol, NPH but add rybelsus, refer endo and DM education, encouraged compliance with tx

## 2021-10-27 ENCOUNTER — Encounter: Payer: Self-pay | Admitting: Internal Medicine

## 2021-10-30 MED ORDER — DEXCOM G6 TRANSMITTER MISC
0 refills | Status: DC
Start: 2021-10-30 — End: 2022-03-29

## 2021-10-30 MED ORDER — DEXCOM G6 RECEIVER DEVI: 0 refills | Status: AC

## 2021-10-30 MED ORDER — DEXCOM G6 SENSOR MISC: 3 refills | Status: DC

## 2022-02-02 ENCOUNTER — Other Ambulatory Visit: Payer: Self-pay | Admitting: Internal Medicine

## 2022-02-26 ENCOUNTER — Other Ambulatory Visit: Payer: Self-pay | Admitting: Internal Medicine

## 2022-03-28 ENCOUNTER — Ambulatory Visit: Payer: BC Managed Care – PPO | Admitting: Internal Medicine

## 2022-03-29 ENCOUNTER — Ambulatory Visit: Payer: BC Managed Care – PPO | Admitting: Internal Medicine

## 2022-03-29 VITALS — BP 190/102 | HR 84 | Temp 97.8°F | Ht 69.0 in | Wt 393.0 lb

## 2022-03-29 DIAGNOSIS — M7712 Lateral epicondylitis, left elbow: Secondary | ICD-10-CM | POA: Diagnosis not present

## 2022-03-29 DIAGNOSIS — E1165 Type 2 diabetes mellitus with hyperglycemia: Secondary | ICD-10-CM

## 2022-03-29 DIAGNOSIS — Z794 Long term (current) use of insulin: Secondary | ICD-10-CM | POA: Diagnosis not present

## 2022-03-29 DIAGNOSIS — I10 Essential (primary) hypertension: Secondary | ICD-10-CM | POA: Diagnosis not present

## 2022-03-29 DIAGNOSIS — E559 Vitamin D deficiency, unspecified: Secondary | ICD-10-CM | POA: Diagnosis not present

## 2022-03-29 DIAGNOSIS — E7800 Pure hypercholesterolemia, unspecified: Secondary | ICD-10-CM

## 2022-03-29 LAB — HEPATIC FUNCTION PANEL
ALT: 25 U/L (ref 0–53)
AST: 24 U/L (ref 0–37)
Albumin: 4.4 g/dL (ref 3.5–5.2)
Alkaline Phosphatase: 43 U/L (ref 39–117)
Bilirubin, Direct: 0.1 mg/dL (ref 0.0–0.3)
Total Bilirubin: 0.5 mg/dL (ref 0.2–1.2)
Total Protein: 7.3 g/dL (ref 6.0–8.3)

## 2022-03-29 LAB — BASIC METABOLIC PANEL
BUN: 14 mg/dL (ref 6–23)
CO2: 30 mEq/L (ref 19–32)
Calcium: 9 mg/dL (ref 8.4–10.5)
Chloride: 103 mEq/L (ref 96–112)
Creatinine, Ser: 0.93 mg/dL (ref 0.40–1.50)
GFR: 104.04 mL/min (ref >60.00)
Glucose, Bld: 79 mg/dL (ref 70–99)
Potassium: 3.4 mEq/L — ABNORMAL LOW (ref 3.5–5.1)
Sodium: 139 mEq/L (ref 135–145)

## 2022-03-29 LAB — HEMOGLOBIN A1C: Hgb A1c MFr Bld: 8.3 % — ABNORMAL HIGH (ref 4.6–6.5)

## 2022-03-29 LAB — LIPID PANEL
Cholesterol: 170 mg/dL (ref 0–200)
HDL: 53.6 mg/dL (ref >39.00)
LDL Cholesterol: 105 mg/dL — ABNORMAL HIGH (ref 0–99)
NonHDL: 116.41
Total CHOL/HDL Ratio: 3
Triglycerides: 56 mg/dL (ref 0.0–149.0)
VLDL: 11.2 mg/dL (ref 0.0–40.0)

## 2022-03-29 LAB — VITAMIN D 25 HYDROXY (VIT D DEFICIENCY, FRACTURES): VITD: 20.26 ng/mL — ABNORMAL LOW (ref 30.00–100.00)

## 2022-03-29 MED ORDER — LOSARTAN POTASSIUM 100 MG PO TABS: 100.0000 mg | ORAL_TABLET | Freq: Every day | ORAL | 3 refills | Status: DC

## 2022-03-29 MED ORDER — DEXCOM G6 TRANSMITTER MISC
11 refills | Status: DC
Start: 2022-03-29 — End: 2022-12-10

## 2022-03-29 MED ORDER — MELOXICAM 15 MG PO TABS: ORAL_TABLET | ORAL | 2 refills | Status: DC

## 2022-03-29 NOTE — Patient Instructions (Addendum)
Ok to stay off the amlodipine  Ok to increase the losartan to 100 mg per day for blood pressure  Please take all new medication as prescribed - the mobic 15 mg per day as needed for pain  You can also use the Voltaren gel (OTC) as needed for the left and right elbows  Please see Sports Medicine if the treatment is not working for your elbows  Please continue all other medications as before, and refills have been done if requested.  Please have the pharmacy call with any other refills you may need.  Please keep your appointments with your specialists as you may have planned  Please go to the LAB at the blood drawing area for the tests to be done  You will be contacted by phone if any changes need to be made immediately.  Otherwise, you will receive a letter about your results with an explanation, but please check with MyChart first.  Please remember to sign up for MyChart if you have not done so, as this will be important to you in the future with finding out test results, communicating by private email, and scheduling acute appointments online when needed.  Please make an Appointment to return in 6 months, or sooner if needed

## 2022-03-29 NOTE — Progress Notes (Unsigned)
Patient ID: Caleb [name], male   DOB: 1981-03-08, 41 y.o.   MRN: [id number]        Chief Complaint: follow up HTN, HLD and hyperglycemia, and ED       HPI:  Caleb [name] is a 41 y.o. male here with c/o worsening ED recently but much improved after stopping his BP meds, so not sure which may have caused it. Making it difficult to conceive as he and wife now actively trying.   Also c/o 1 wk onset left lateral elbow area tender sore with some swelling, worse lifting and maintenance, and has had extra lifting and arm use for summer activity he doesn't normally do the rest of the year.  Pt denies chest pain, increased sob or doe, wheezing, orthopnea, PND, increased LE swelling, palpitations, dizziness or syncope.   Pt denies polydipsia, polyuria, or new focal neuro s/s.    Pt denies fever, wt loss, night sweats, loss of appetite, or other constitutional symptoms         Wt Readings from Last 3 Encounters:  03/29/22 (!) 393 lb (178.3 kg)  09/27/21 (!) 390 lb (176.9 kg)  03/22/21 (!) 396 lb (179.6 kg)   BP Readings from Last 3 Encounters:  03/29/22 (!) 190/102  09/27/21 (!) 150/100  09/22/21 (!) 167/108         Past Medical History:  Diagnosis Date   Cardiac tamponade 10/2013   Enlarged pulmonary artery ([facility])    a. Mild enlargement of pulmonary artery by CT 11/01/13.   GERD (gastroesophageal reflux disease)    HTN (hypertension)    Hx of echocardiogram    Echo (12/14/13):  Mod LVH, EF 50-55%, no RWMA, trivial Eff.   LV dysfunction    a. 10/2013:  EF 45-50% with followup echo 55-60%.   Morbid obesity ([facility])    Pericarditis    a. 10/2013: Acute pericarditis with cardiac tamponade s/p pericardiocentesis.   Sleep apnea    Type 2 diabetes mellitus without complication, without long-term current use of insulin ([facility]) 01/02/2017   Past Surgical History:  Procedure Laterality Date   PERICARDIAL TAP N/A 11/02/2013   Procedure: PERICARDIAL TAP;  Surgeon: Lennette Bihari, MD;  Location: [facility] CATH LAB;   Service: Cardiovascular;  Laterality: N/A;    reports that he has been smoking cigars and cigarettes. He started smoking about 6 years ago. He has never used smokeless tobacco. He reports current alcohol use. He reports that he does not use drugs. family history includes Breast cancer in his maternal grandmother; CAD in an other family member; Colon cancer in his paternal grandmother and paternal uncle; Congestive Heart Failure in his maternal grandfather; Diabetes in his brother, father, maternal grandfather, maternal grandmother, and mother; Healthy in his paternal grandfather; Heart attack in his father; Heart disease in his mother; Heart failure in his father; Hyperlipidemia in his maternal grandfather, maternal grandmother, and mother; Hypertension in his maternal grandmother and mother; Kidney cancer in his father; Kidney disease in his mother; Non-Hodgkin's lymphoma in his father; Prostate cancer in his paternal uncle; Sleep apnea in his brother. Allergies  Allergen Reactions   Metformin And Related Diarrhea   Current Outpatient Medications on File Prior to Visit  Medication Sig Dispense Refill   albuterol (VENTOLIN HFA) 108 (90 Base) MCG/ACT inhaler Inhale 1-2 puffs into the lungs every 6 (six) hours as needed for wheezing or shortness of breath. 8 g 1   Cholecalciferol 50 MCG (2000 UT) TABS 1 tab by mouth once  daily 30 tablet 99   clotrimazole-betamethasone (LOTRISONE) cream Apply 1 application topically daily. 30 g 0   Continuous Blood Gluc Receiver (DEXCOM G6 RECEIVER) DEVI Use as directed once daily E11.9 1 each 0   Continuous Blood Gluc Sensor (DEXCOM G6 SENSOR) MISC Use as directed apply one every 10 days 3 each 11   glipiZIDE (GLUCOTROL XL) 10 MG 24 hr tablet Take 1 tablet (10 mg total) by mouth daily with breakfast. 90 tablet 3   ibuprofen (ADVIL) 800 MG tablet Take 1 tablet (800 mg total) by mouth every 8 (eight) hours as needed for moderate pain. 50 tablet 0   Lancets MISC Use as  directed once daily E11.9 100 each 12   pantoprazole (PROTONIX) 40 MG tablet Take 1 tablet (40 mg total) by mouth 2 (two) times daily. 180 tablet 3   Semaglutide (RYBELSUS) 3 MG TABS Take 3 mg by mouth daily. 90 tablet 3   sildenafil (VIAGRA) 100 MG tablet Take 1 tablet (100 mg total) by mouth daily as needed for erectile dysfunction. 10 tablet 5   predniSONE (DELTASONE) 10 MG tablet TAKE 1 TAB THREE TIMES A DAY FOR 2 DAYS, 1 TAB TWICE A DAY FOR 5 DAYS, 1 TAB DAILY TILL FINISHED     No current facility-administered medications on file prior to visit.        ROS:  All others reviewed and negative.  Objective        PE:  BP (!) 190/102 (BP Location: Right Arm, Patient Position: Sitting, Cuff Size: Large)   Pulse 84   Temp 97.8 F (36.6 C) (Oral)   Ht 5\' 9"  (1.753 m)   Wt (!) 393 lb (178.3 kg)   SpO2 96%   BMI 58.04 kg/m                 Constitutional: Pt appears in NAD               HENT: Head: NCAT.                Right Ear: External ear normal.                 Left Ear: External ear normal.                Eyes: . Pupils are equal, round, and reactive to light. Conjunctivae and EOM are normal               Nose: without d/c or deformity               Neck: Neck supple. Gross normal ROM               Cardiovascular: Normal rate and regular rhythm.                 Pulmonary/Chest: Effort normal and breath sounds without rales or wheezing.                Neurological: Pt is alert. At baseline orientation, motor grossly intact               Skin: Skin is warm. No rashes, no other new lesions, LE edema - none; has mild to mod tender over left lateral elbow epicondylar area               Psychiatric: Pt behavior is normal without agitation   Micro: none  Cardiac tracings I have personally interpreted today:  none  Pertinent Radiological findings (summarize): none  Lab Results  Component Value Date   WBC 9.9 09/27/2021   HGB 13.3 09/27/2021   HCT 42.9 09/27/2021   PLT 301  09/27/2021   GLUCOSE 79 03/29/2022   CHOL 170 03/29/2022   TRIG 56.0 03/29/2022   HDL 53.60 03/29/2022   LDLDIRECT 112.0 03/22/2021   LDLCALC 105 (H) 03/29/2022   ALT 25 03/29/2022   AST 24 03/29/2022   NA 139 03/29/2022   K 3.4 (L) 03/29/2022   CL 103 03/29/2022   CREATININE 0.93 03/29/2022   BUN 14 03/29/2022   CO2 30 03/29/2022   TSH 2.53 09/27/2021   PSA 0.32 12/20/2019   INR 1.0 07/06/2020   HGBA1C 8.3 (H) 03/29/2022   MICROALBUR 2.3 09/27/2021   Assessment/Plan:  Caleb [name] is a 41 y.o. Black or African American [2] male with  has a past medical history of Cardiac tamponade (10/2013), Enlarged pulmonary artery ([facility]), GERD (gastroesophageal reflux disease), HTN (hypertension), echocardiogram, LV dysfunction, Morbid obesity ([facility]), Pericarditis, Sleep apnea, and Type 2 diabetes mellitus without complication, without long-term current use of insulin ([facility]) (01/02/2017).  HLD (hyperlipidemia) Lab Results  Component Value Date   LDLCALC 105 (H) 03/29/2022   Uncontrolled, pt for increased lipitor to 80 mg qd   Essential hypertension BP Readings from Last 3 Encounters:  03/29/22 (!) 190/102  09/27/21 (!) 150/100  09/22/21 (!) 167/108   Severe uncontrolled,  pt to restart medical treatment losartan 100 mg qd, as this is less likely to be the cause of the ED than norvasc   Type 2 diabetes mellitus ([facility]) Lab Results  Component Value Date   HGBA1C 8.3 (H) 03/29/2022   uncontroled, pt to continue current medical treatment glpizide xl 10 mg qd, increased NPH 45 units, and add rybelsus 3 mg qd    Vitamin D deficiency Last vitamin D Lab Results  Component Value Date   VD25OH 20.26 (L) 03/29/2022   Low, to start oral replacement   Left lateral epicondylitis Mild to mod, for volt gel prn, and mobic 15 mg qd prn,  to f/u any worsening symptoms or concerns  Followup: Return in about 6 months (around 09/29/2022).  Oliver Barre, MD 03/30/2022 9:06 PM [facility]

## 2022-03-30 ENCOUNTER — Encounter: Payer: Self-pay | Admitting: Internal Medicine

## 2022-03-30 ENCOUNTER — Other Ambulatory Visit: Payer: Self-pay | Admitting: Internal Medicine

## 2022-03-30 DIAGNOSIS — M7712 Lateral epicondylitis, left elbow: Secondary | ICD-10-CM | POA: Insufficient documentation

## 2022-03-30 DIAGNOSIS — E1165 Type 2 diabetes mellitus with hyperglycemia: Secondary | ICD-10-CM

## 2022-03-30 MED ORDER — INSULIN NPH (HUMAN) (ISOPHANE) 100 UNIT/ML ~~LOC~~ SUSP: SUBCUTANEOUS | 5 refills | Status: DC

## 2022-03-30 MED ORDER — ATORVASTATIN CALCIUM 80 MG PO TABS: 80.0000 mg | ORAL_TABLET | Freq: Every day | ORAL | 3 refills | Status: DC

## 2022-03-30 NOTE — Assessment & Plan Note (Signed)
Mild to mod, for volt gel prn, and mobic 15 mg qd prn,  to f/u any worsening symptoms or concerns

## 2022-03-30 NOTE — Assessment & Plan Note (Signed)
Lab Results  Component Value Date   HGBA1C 8.3 (H) 03/29/2022   uncontroled, pt to continue current medical treatment glpizide xl 10 mg qd, increased NPH 45 units, and add rybelsus 3 mg qd

## 2022-03-30 NOTE — Assessment & Plan Note (Signed)
BP Readings from Last 3 Encounters:  03/29/22 (!) 190/102  09/27/21 (!) 150/100  09/22/21 (!) 167/108   Severe uncontrolled,  pt to restart medical treatment losartan 100 mg qd, as this is less likely to be the cause of the ED than norvasc

## 2022-03-30 NOTE — Assessment & Plan Note (Signed)
Lab Results  Component Value Date   LDLCALC 105 (H) 03/29/2022   Uncontrolled, pt for increased lipitor to 80 mg qd

## 2022-03-30 NOTE — Assessment & Plan Note (Signed)
Last vitamin D Lab Results  Component Value Date   VD25OH 20.26 (L) 03/29/2022   Low, to start oral replacement

## 2022-04-01 ENCOUNTER — Encounter: Payer: Self-pay | Admitting: Internal Medicine

## 2022-04-02 NOTE — Telephone Encounter (Signed)
Yes, please increase the insulin to 60 units per day

## 2022-04-02 NOTE — Telephone Encounter (Signed)
Patient states that he is already taking 50mg  every morning and would like to know if he should increase it. Please advise.

## 2022-04-07 ENCOUNTER — Other Ambulatory Visit: Payer: Self-pay | Admitting: Internal Medicine

## 2022-08-03 ENCOUNTER — Ambulatory Visit (HOSPITAL_COMMUNITY)
Admission: EM | Admit: 2022-08-03 | Discharge: 2022-08-03 | Disposition: A | Discharge status: Home / Self Care | Payer: BC Managed Care – PPO | Attending: Internal Medicine | Admitting: Internal Medicine

## 2022-08-03 ENCOUNTER — Encounter (HOSPITAL_COMMUNITY): Payer: Self-pay

## 2022-08-03 DIAGNOSIS — I10 Essential (primary) hypertension: Secondary | ICD-10-CM

## 2022-08-03 DIAGNOSIS — J069 Acute upper respiratory infection, unspecified: Secondary | ICD-10-CM

## 2022-08-03 MED ORDER — PROMETHAZINE-DM 6.25-15 MG/5ML PO SYRP: 5.0000 mL | ORAL_SOLUTION | Freq: Every evening | ORAL | 0 refills | Status: DC | PRN

## 2022-08-03 MED ORDER — BENZONATATE 100 MG PO CAPS: 100.0000 mg | ORAL_CAPSULE | Freq: Three times a day (TID) | ORAL | 0 refills | Status: DC

## 2022-08-03 NOTE — Discharge Instructions (Addendum)
You have a viral upper respiratory infection.   Purchase mucinex (guaifenesin) 1200mg  and take this every 12 hours for the next few days to thin your nasal congestion and mucous so that you are able to get out of your body easier by coughing and blowing your nose. Drink plenty of water while taking this.  Take tessalon pearles every 8 hours as needed for cough.  Take Promethazine DM cough medication to help with your cough at nighttime so that you are able to sleep. Do not drive, drink alcohol, or go to work while taking this medication since it can make you sleepy. Only take this at nighttime.   You may take tylenol 1,000mg  every 6 hours as needed for fever, chills, aches, and pains associated with viral illness.  Keep taking BP medicine as prescribed. Schedule an appointment with your primary care provider for follow-up to discuss your BP medication regimen.   If you develop any new or worsening symptoms, please return.  If your symptoms are severe, please go to the emergency room.  Follow-up with your primary care provider for further evaluation and management of your symptoms as well as ongoing wellness visits.  I hope you feel better!

## 2022-08-03 NOTE — ED Triage Notes (Signed)
Chief Complaint: loss of appetite, headaches, cough, chills. No fever. Sore throat. Cough is productive with yellow and white mucus.   Onset: This past Sunday   Prescriptions or OTC medications tried: Yes- Day and Nyquil, Excedrin, cough drops, TheraFlu     with little relief  Sick exposure: Patient works at an [employer], medications, or products: No  Recent Travel: No

## 2022-08-03 NOTE — ED Provider Notes (Addendum)
[facility]    CSN: [id number] Arrival date & time: 08/03/22  1254      History   Chief Complaint Chief Complaint  Patient presents with   URI   Chills    HPI Caleb [name] is a 41 y.o. male.   Patient presents urgent care for evaluation of cough, nasal congestion, sore throat, and generalized fatigue for the last 6 days.  Symptoms started with fever, chills, and bodyaches.  He no longer is experiencing any fever or chills but has a persistent productive cough with clear/yellow sputum.  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  Patient is a [employer] in an [employer] school and believes he may have caught a viral illness from his job.  His wife and son are at home without symptoms.  Denies headache, dizziness, nausea, vomiting, abdominal pain, leg swelling, and heart palpitations.  No chest pain or shortness of breath reported.  Denies history of asthma/COPD.  Intermittently smokes cigarettes and cigars.  Denies all other drug use. Has been using OTC medications for symptoms without relief.    URI   Past Medical History:  Diagnosis Date   Cardiac tamponade 10/2013   Enlarged pulmonary artery (HCC)    a. Mild enlargement of pulmonary artery by CT 11/01/13.   GERD (gastroesophageal reflux disease)    HTN (hypertension)    Hx of echocardiogram    Echo (12/14/13):  Mod LVH, EF 50-55%, no RWMA, trivial Eff.   LV dysfunction    a. 10/2013:  EF 45-50% with followup echo 55-60%.   Morbid obesity (HCC)    Pericarditis    a. 10/2013: Acute pericarditis with cardiac tamponade s/p pericardiocentesis.   Sleep apnea    Type 2 diabetes mellitus without complication, without long-term current use of insulin (HCC) 01/02/2017    Patient Active Problem List   Diagnosis Date Noted   Left lateral epicondylitis 03/30/2022   Vitamin D deficiency 09/30/2021   Cough 09/27/2021   HLD (hyperlipidemia) 03/26/2021   Smoker 03/26/2021   Oropharyngeal  dysphagia 10/30/2020   Hives 09/24/2020   History of COVID-19 08/21/2020   COVID-19 08/16/2020   Cellulitis of scrotum 07/06/2020   Sepsis (HCC) 07/06/2020   Type 2 diabetes mellitus (HCC) 07/06/2020   Urinary frequency 12/16/2019   Left carpal tunnel syndrome 12/16/2019   Nocturia more than twice per night 10/28/2018   Erectile dysfunction 10/28/2018   Encounter for well adult exam with abnormal findings 07/23/2017   GERD (gastroesophageal reflux disease)    OSA (obstructive sleep apnea) 05/16/2014   Cardiac tamponade 11/04/2013   Morbid obesity (HCC) 11/04/2013   Essential hypertension 11/04/2013   Acute pericarditis 11/02/2013    Past Surgical History:  Procedure Laterality Date   PERICARDIAL TAP N/A 11/02/2013   Procedure: PERICARDIAL TAP;  Surgeon: Lennette Bihari, MD;  Location: [facility] CATH LAB;  Service: Cardiovascular;  Laterality: N/A;       Home Medications    Prior to Admission medications   Medication Sig Start Date End Date Taking? Authorizing Provider  atorvastatin (LIPITOR) 80 MG tablet Take 1 tablet (80 mg total) by mouth daily. 03/30/22  Yes Corwin Levins, MD  Cholecalciferol 50 MCG (2000 UT) TABS 1 tab by mouth once daily 09/28/21  Yes Corwin Levins, MD  Continuous Blood Gluc Receiver (DEXCOM G6 RECEIVER) DEVI Use as directed once daily E11.9 10/30/21  Yes Corwin Levins, MD  Continuous Blood Gluc Sensor (DEXCOM G6  SENSOR) MISC Use as directed apply one every 10 days 02/26/22  Yes Corwin Levins, MD  Continuous Blood Gluc Transmit (DEXCOM G6 TRANSMITTER) MISC Use as directed once daily E11.9 03/29/22  Yes Corwin Levins, MD  glipiZIDE (GLUCOTROL XL) 10 MG 24 hr tablet Take 1 tablet (10 mg total) by mouth daily with breakfast. 09/27/21  Yes Corwin Levins, MD  ibuprofen (ADVIL) 800 MG tablet Take 1 tablet (800 mg total) by mouth every 8 (eight) hours as needed for moderate pain. 12/16/19  Yes Corwin Levins, MD  insulin NPH Human (NOVOLIN N RELION) 100 UNIT/ML injection INJECT  45 UNITS SUBCUTANEOUSLY ONCE DAILY IN THE MORNING 03/30/22  Yes Corwin Levins, MD  Lancets MISC Use as directed once daily E11.9 01/31/20  Yes Corwin Levins, MD  losartan (COZAAR) 100 MG tablet Take 1 tablet (100 mg total) by mouth daily. 03/29/22  Yes Corwin Levins, MD  meloxicam [facility]) 15 MG tablet 1 tab by mouth once daily as needed for pain 03/29/22  Yes Corwin Levins, MD  pantoprazole (PROTONIX) 40 MG tablet Take 1 tablet (40 mg total) by mouth 2 (two) times daily. 09/27/21  Yes Corwin Levins, MD  Semaglutide (RYBELSUS) 3 MG TABS Take 3 mg by mouth daily. 09/28/21  Yes Corwin Levins, MD  albuterol (VENTOLIN HFA) 108 (90 Base) MCG/ACT inhaler Inhale 1-2 puffs into the lungs every 6 (six) hours as needed for wheezing or shortness of breath. 09/22/21   Prosperi, Christian H, PA-C  clotrimazole-betamethasone (LOTRISONE) cream Apply 1 application topically daily. 01/12/21   Corwin Levins, MD  predniSONE (DELTASONE) 10 MG tablet TAKE 1 TAB THREE TIMES A DAY FOR 2 DAYS, 1 TAB TWICE A DAY FOR 5 DAYS, 1 TAB DAILY TILL FINISHED    [provider]  sildenafil (VIAGRA) 100 MG tablet Take 1 tablet (100 mg total) by mouth daily as needed for erectile dysfunction. 05/09/21   Corwin Levins, MD    Family History Family History  Problem Relation Age of Onset   Heart failure Father    Heart attack Father    Kidney cancer Father    Diabetes Father    Non-Hodgkin's lymphoma Father    Colon cancer Paternal Grandmother    Hypertension Mother    Heart disease Mother    Kidney disease Mother    Diabetes Mother    Hyperlipidemia Mother    Breast cancer Maternal Grandmother    Diabetes Maternal Grandmother    Hypertension Maternal Grandmother    Hyperlipidemia Maternal Grandmother    Diabetes Maternal Grandfather    Hyperlipidemia Maternal Grandfather    Congestive Heart Failure Maternal Grandfather    Healthy Paternal Grandfather    CAD Other        Aunt with CABG   Diabetes Brother    Colon cancer  Paternal Uncle    Prostate cancer Paternal Uncle    Sleep apnea Brother    Stroke Neg Hx     Social History Social History   Tobacco Use   Smoking status: Some Days    Packs/day: 0.00    Years: 5.00    Total pack years: 0.00    Types: Cigars, Cigarettes    Start date: 2017   Smokeless tobacco: Never   Tobacco comments:    1 cigar daily  Vaping Use   Vaping Use: Never used  Substance Use Topics   Alcohol use: Yes    Comment: occasional   Drug  use: No     Allergies   Metformin and related   Review of Systems Review of Systems Per HPI  Physical Exam Triage Vital Signs ED Triage Vitals  Enc Vitals Group     BP 08/03/22 1444 (!) 184/119     Pulse Rate 08/03/22 1444 82     Resp 08/03/22 1444 16     Temp 08/03/22 1444 98.6 F (37 C)     Temp Source 08/03/22 1444 Oral     SpO2 08/03/22 1444 96 %     Weight 08/03/22 1446 (!) 375 lb (170.1 kg)     Height 08/03/22 1446 5\' 10"  (1.778 m)     Head Circumference --      Peak Flow --      Pain Score 08/03/22 1443 0     Pain Loc --      Pain Edu? --      Excl. in GC? --    No data found.  Updated Vital Signs BP (!) 159/82 (BP Location: Left Arm)   Pulse 82   Temp 98.6 F (37 C) (Oral)   Resp 16   Ht 5\' 10"  (1.778 m)   Wt (!) 375 lb (170.1 kg)   SpO2 96%   BMI 53.81 kg/m   Visual Acuity Right Eye Distance:   Left Eye Distance:   Bilateral Distance:    Right Eye Near:   Left Eye Near:    Bilateral Near:     Physical Exam Vitals and nursing note reviewed.  Constitutional:      Appearance: He is obese. He is not ill-appearing or toxic-appearing.  HENT:     Head: Normocephalic and atraumatic.     Right Ear: Hearing, tympanic membrane, ear canal and external ear normal.     Left Ear: Hearing, tympanic membrane, ear canal and external ear normal.     Nose: Congestion present.     Mouth/Throat:     Lips: Pink.     Mouth: Mucous membranes are moist.     Pharynx: No posterior oropharyngeal erythema.   Eyes:     General: Lids are normal. Vision grossly intact. Gaze aligned appropriately.     Extraocular Movements: Extraocular movements intact.     Conjunctiva/sclera: Conjunctivae normal.  Cardiovascular:     Rate and Rhythm: Normal rate and regular rhythm.     Heart sounds: Normal heart sounds, S1 normal and S2 normal.  Pulmonary:     Effort: Pulmonary effort is normal. No respiratory distress.     Breath sounds: Normal breath sounds and air entry.     Comments: No adventitious lung sounds heard auscultation. Musculoskeletal:     Cervical back: Neck supple.  Lymphadenopathy:     Cervical: No cervical adenopathy.  Skin:    General: Skin is warm and dry.     Capillary Refill: Capillary refill takes less than 2 seconds.     Findings: No rash.  Neurological:     General: No focal deficit present.     Mental Status: He is alert and oriented to person, place, and time. Mental status is at baseline.     Cranial Nerves: No dysarthria or facial asymmetry.  Psychiatric:        Mood and Affect: Mood normal.        Speech: Speech normal.        Behavior: Behavior normal.        Thought Content: Thought content normal.        Judgment:  Judgment normal.      UC Treatments / Results  Labs (all labs ordered are listed, but only abnormal results are displayed) Labs Reviewed - No data to display  EKG   Radiology No results found.  Procedures Procedures (including critical care time)  Medications Ordered in UC Medications - No data to display  Initial Impression / Assessment and Plan / UC Course  I have reviewed the triage vital signs and the nursing notes.  Pertinent labs & imaging results that were available during my care of the patient were reviewed by me and considered in my medical decision making (see chart for details).   Viral URI with cough Symptoms and physical exam consistent with a viral upper respiratory tract infection that will likely resolve with rest,  fluids, and prescriptions for symptomatic relief. No indication for imaging today based on stable cardiopulmonary exam and hemodynamically stable vital signs.  Deferred viral testing due to timing of illness.  Tessalon perles every 8 hours as needed for cough. Mucinex as needed for congestion and cough. Promethazine DM cough medication may be used as needed only at bedtime due to possible drowsiness side effect (no alcohol, working, or driving while taking this advised).  May use ibuprofen/tylenol over the counter for body aches, fever/chills, and overall discomfort associated with viral illness. Nonpharmacologic interventions for symptom relief provided and after visit summary below.   2. Essential hypertension Patient is stable in appearance and without signs of endorgan damage.  Blood pressure initially 184/119, this reduced to 159/82 upon recheck after exam.  Follow-up with PCP recommended for further management of blood pressure.  Patient to continue taking losartan 100 mg every day to manage blood pressure and reduce salt in diet.  Strict ED/urgent care return precautions given.  Patient verbalizes understanding and agreement with plan.  Counseled patient regarding possible side effects and uses of all medications prescribed at today's visit.  Patient verbalizes understanding and agreement with plan.  All questions answered.  Patient discharged from urgent care in stable condition.        Final Clinical Impressions(s) / UC Diagnoses   Final diagnoses:  Viral URI with cough  Essential hypertension     Discharge Instructions      You have a viral upper respiratory infection.   Purchase mucinex (guaifenesin)  and take this every 12 hours for the next few days to thin your nasal congestion and mucous so that you are able to get out of your body easier by coughing and blowing your nose. Drink plenty of water while taking this.  Take tessalon pearles every 8 hours as needed for  cough.  Take Promethazine DM cough medication to help with your cough at nighttime so that you are able to sleep. Do not drive, drink alcohol, or go to work while taking this medication since it can make you sleepy. Only take this at nighttime.   You may take tylenol 1,000mg  every 6 hours as needed for fever, chills, aches, and pains associated with viral illness.  Keep taking BP medicine as prescribed. Schedule an appointment with your primary care provider for follow-up to discuss your BP medication regimen.   If you develop any new or worsening symptoms, please return.  If your symptoms are severe, please go to the emergency room.  Follow-up with your primary care provider for further evaluation and management of your symptoms as well as ongoing wellness visits.  I hope you feel better!     ED Prescriptions  None    PDMP not reviewed this encounter.   Carlisle BeersStanhope, Ziyon Soltau M, FNP 08/03/22 1544    Carlisle BeersStanhope, Eliot Popper M, FNP 08/03/22 [phone]

## 2022-08-25 ENCOUNTER — Encounter: Payer: Self-pay | Admitting: Internal Medicine

## 2022-08-26 ENCOUNTER — Other Ambulatory Visit: Payer: Self-pay

## 2022-08-26 MED ORDER — SILDENAFIL CITRATE 100 MG PO TABS: 100.0000 mg | ORAL_TABLET | Freq: Every day | ORAL | 5 refills | Status: DC | PRN

## 2022-09-04 ENCOUNTER — Telehealth: Payer: Self-pay

## 2022-09-04 MED ORDER — TADALAFIL 20 MG PO TABS: 10.0000 mg | ORAL_TABLET | ORAL | 11 refills | Status: AC | PRN

## 2022-09-04 NOTE — Telephone Encounter (Signed)
Ok this is done 

## 2022-09-04 NOTE — Telephone Encounter (Signed)
Per pharmacy which states pt insurance will cover Tadafil 20MG  of Vardenafil 20MG , please send to [facility] on [address] as Sildenfail is no longer covered

## 2022-09-20 NOTE — Telephone Encounter (Signed)
Please clarify what pt wants, as he is not currently taking novolog

## 2022-09-20 NOTE — Telephone Encounter (Signed)
Per patient,"I want to see if Dr Jenny Reichmann can put me in prescription for NovoLog pin insulin my insurance covers the pin now."

## 2022-09-23 NOTE — Telephone Encounter (Signed)
Pt is due for ROV and we can discuss further; we dont normally change medications outside of an office visit as this can lead to confusion on both sides

## 2022-09-23 NOTE — Telephone Encounter (Signed)
Pt is wanting to switch to an insulin pen as he currently does the syringes. He reports Novolog pen is covered by his insurance.

## 2022-09-26 ENCOUNTER — Ambulatory Visit: Payer: BC Managed Care – PPO | Admitting: Internal Medicine

## 2022-10-02 ENCOUNTER — Ambulatory Visit: Payer: BC Managed Care – PPO | Admitting: Internal Medicine

## 2022-10-02 VITALS — BP 182/90 | HR 81 | Temp 98.1°F | Ht 70.0 in | Wt 391.0 lb

## 2022-10-02 DIAGNOSIS — E559 Vitamin D deficiency, unspecified: Secondary | ICD-10-CM

## 2022-10-02 DIAGNOSIS — I10 Essential (primary) hypertension: Secondary | ICD-10-CM

## 2022-10-02 DIAGNOSIS — Z0001 Encounter for general adult medical examination with abnormal findings: Secondary | ICD-10-CM

## 2022-10-02 DIAGNOSIS — E7800 Pure hypercholesterolemia, unspecified: Secondary | ICD-10-CM

## 2022-10-02 DIAGNOSIS — Z794 Long term (current) use of insulin: Secondary | ICD-10-CM | POA: Diagnosis not present

## 2022-10-02 DIAGNOSIS — E1165 Type 2 diabetes mellitus with hyperglycemia: Secondary | ICD-10-CM

## 2022-10-02 DIAGNOSIS — F17200 Nicotine dependence, unspecified, uncomplicated: Secondary | ICD-10-CM

## 2022-10-02 DIAGNOSIS — E538 Deficiency of other specified B group vitamins: Secondary | ICD-10-CM | POA: Diagnosis not present

## 2022-10-02 LAB — HEPATIC FUNCTION PANEL
ALT: 26 U/L (ref 0–53)
AST: 23 U/L (ref 0–37)
Albumin: 4.5 g/dL (ref 3.5–5.2)
Alkaline Phosphatase: 46 U/L (ref 39–117)
Bilirubin, Direct: 0.2 mg/dL (ref 0.0–0.3)
Total Bilirubin: 0.6 mg/dL (ref 0.2–1.2)
Total Protein: 7.4 g/dL (ref 6.0–8.3)

## 2022-10-02 LAB — CBC WITH DIFFERENTIAL/PLATELET
Basophils Absolute: 0 10*3/uL (ref 0.0–0.1)
Basophils Relative: 0.5 % (ref 0.0–3.0)
Eosinophils Absolute: 0.3 10*3/uL (ref 0.0–0.7)
Eosinophils Relative: 3.5 % (ref 0.0–5.0)
HCT: 41.4 % (ref 39.0–52.0)
Hemoglobin: 13.2 g/dL (ref 13.0–17.0)
Lymphocytes Relative: 44.3 % (ref 12.0–46.0)
Lymphs Abs: 3.3 10*3/uL (ref 0.7–4.0)
MCHC: 31.9 g/dL (ref 30.0–36.0)
MCV: 72.9 fl — ABNORMAL LOW (ref 78.0–100.0)
Monocytes Absolute: 0.7 10*3/uL (ref 0.1–1.0)
Monocytes Relative: 8.8 % (ref 3.0–12.0)
Neutro Abs: 3.2 10*3/uL (ref 1.4–7.7)
Neutrophils Relative %: 42.9 % — ABNORMAL LOW (ref 43.0–77.0)
Platelets: 216 10*3/uL (ref 150.0–400.0)
RBC: 5.68 Mil/uL (ref 4.22–5.81)
RDW: 15.4 % (ref 11.5–15.5)
WBC: 7.5 10*3/uL (ref 4.0–10.5)

## 2022-10-02 LAB — BASIC METABOLIC PANEL
BUN: 18 mg/dL (ref 6–23)
CO2: 25 mEq/L (ref 19–32)
Calcium: 9.2 mg/dL (ref 8.4–10.5)
Chloride: 103 mEq/L (ref 96–112)
Creatinine, Ser: 0.89 mg/dL (ref 0.40–1.50)
GFR: 108.19 mL/min (ref >60.00)
Glucose, Bld: 130 mg/dL — ABNORMAL HIGH (ref 70–99)
Potassium: 4 mEq/L (ref 3.5–5.1)
Sodium: 141 mEq/L (ref 135–145)

## 2022-10-02 LAB — LIPID PANEL
Cholesterol: 115 mg/dL (ref 0–200)
HDL: 52.7 mg/dL (ref >39.00)
LDL Cholesterol: 46 mg/dL (ref 0–99)
NonHDL: 62.57
Total CHOL/HDL Ratio: 2
Triglycerides: 84 mg/dL (ref 0.0–149.0)
VLDL: 16.8 mg/dL (ref 0.0–40.0)

## 2022-10-02 LAB — URINALYSIS, ROUTINE W REFLEX MICROSCOPIC
Bilirubin Urine: NEGATIVE
Hgb urine dipstick: NEGATIVE
Ketones, ur: NEGATIVE
Leukocytes,Ua: NEGATIVE
Nitrite: NEGATIVE
Specific Gravity, Urine: 1.025 (ref 1.000–1.030)
Total Protein, Urine: NEGATIVE
Urine Glucose: NEGATIVE
Urobilinogen, UA: 1 (ref 0.0–1.0)
pH: 7 (ref 5.0–8.0)

## 2022-10-02 LAB — VITAMIN B12: Vitamin B-12: 712 pg/mL (ref 211–911)

## 2022-10-02 LAB — VITAMIN D 25 HYDROXY (VIT D DEFICIENCY, FRACTURES): VITD: 18.87 ng/mL — ABNORMAL LOW (ref 30.00–100.00)

## 2022-10-02 LAB — HEMOGLOBIN A1C: Hgb A1c MFr Bld: 8.5 % — ABNORMAL HIGH (ref 4.6–6.5)

## 2022-10-02 LAB — TSH: TSH: 1.53 u[IU]/mL (ref 0.35–5.50)

## 2022-10-02 MED ORDER — LOSARTAN POTASSIUM 100 MG PO TABS: 100.0000 mg | ORAL_TABLET | Freq: Every day | ORAL | 3 refills | Status: DC

## 2022-10-02 MED ORDER — INSULIN GLARGINE 100 UNIT/ML SOLOSTAR PEN: 45.0000 [IU] | PEN_INJECTOR | Freq: Every day | SUBCUTANEOUS | 11 refills | Status: DC

## 2022-10-02 MED ORDER — PANTOPRAZOLE SODIUM 40 MG PO TBEC: 40.0000 mg | DELAYED_RELEASE_TABLET | Freq: Two times a day (BID) | ORAL | 3 refills | Status: DC

## 2022-10-02 MED ORDER — MELOXICAM 15 MG PO TABS: ORAL_TABLET | ORAL | 3 refills | Status: DC

## 2022-10-02 MED ORDER — RYBELSUS 3 MG PO TABS: 3.0000 mg | ORAL_TABLET | Freq: Every day | ORAL | 3 refills | Status: DC

## 2022-10-02 MED ORDER — ATORVASTATIN CALCIUM 80 MG PO TABS: 80.0000 mg | ORAL_TABLET | Freq: Every day | ORAL | 3 refills | Status: DC

## 2022-10-02 MED ORDER — GLIPIZIDE ER 10 MG PO TB24: 10.0000 mg | ORAL_TABLET | Freq: Every day | ORAL | 3 refills | Status: DC

## 2022-10-02 NOTE — Patient Instructions (Addendum)
Please remember to call for your yearly eye exam  Ok to STOP the NPH due to the hives  Please take all new medication as prescribed  - the generic pen for Lantus  Please continue all other medications as before, and refills have been done if requested.  Please have the pharmacy call with any other refills you may need.  Please continue your efforts at being more active, low cholesterol diet, and weight control.  You are otherwise up to date with prevention measures today.  Please keep your appointments with your specialists as you may have planned  You will be contacted regarding the referral for: Endocrinology  Please go to the LAB at the blood drawing area for the tests to be done  You will be contacted by phone if any changes need to be made immediately.  Otherwise, you will receive a letter about your results with an explanation, but please check with MyChart first.  Please remember to sign up for MyChart if you have not done so, as this will be important to you in the future with finding out test results, communicating by private email, and scheduling acute appointments online when needed.  Please make an Appointment to return in 6 months, or sooner if needed

## 2022-10-02 NOTE — Progress Notes (Signed)
Patient ID: Caleb [name], male   DOB: Jan 20, 1981, 42 y.o.   MRN: [id number]         Chief Complaint:: wellness exam and 16mofollow up (Switch insulin pen due to breakout of hives, not sure if it's the needles or the insulin)  , ht, hld       HPI:  Caleb COSPERis a 42y.o. male here for wellness exam; declines flu shot but will call for yearly eye exam soon, o/w up to date.  Pt still smoking, not ready to quit                        Also had episodes hives with NPH so will not take further, has been out of all meds for several weeks.  Pt denies chest pain, increased sob or doe, wheezing, orthopnea, PND, increased LE swelling, palpitations, dizziness or syncope.   Pt denies polydipsia, polyuria, or new focal neuro s/s.    Pt denies fever, wt loss, night sweats, loss of appetite, or other constitutional symptoms     Wt Readings from Last 3 Encounters:  10/02/22 (!) 391 lb (177.4 kg)  08/03/22 (!) 375 lb (170.1 kg)  03/29/22 (!) 393 lb (178.3 kg)   BP Readings from Last 3 Encounters:  10/02/22 (!) 182/90  08/03/22 (!) 159/82  03/29/22 (!) 190/102   Immunization History  Administered Date(s) Administered   Influenza,inj,Quad PF,6+ Mos 11/04/2013   Moderna Sars-Covid-2 Vaccination 12/04/2019, 01/01/2020   PPD Test 08/16/2021, 08/25/2021   Tdap 01/06/2016, 11/21/2017   There are no preventive care reminders to display for this patient.     Past Medical History:  Diagnosis Date   Cardiac tamponade 10/2013   [place]larged [place] ([place]    a. Mild enlargement of pulmonary artery by CT 11/01/13.   GERD (gastroesophageal reflux disease)    HTN (hypertension)    Hx of echocardiogram    Echo (12/14/13):  Mod LVH, EF 50-55%, no RWMA, trivial Eff.   LV dysfunction    a. 10/2013:  EF 45-50% with followup echo 55-60%.   Morbid obesity ([place]    Pericarditis    a. 10/2013: Acute pericarditis with cardiac tamponade s/p pericardiocentesis.   Sleep apnea    Type 2 diabetes mellitus  without complication, without long-term current use of insulin [place] 01/02/2017   Past Surgical History:  Procedure Laterality Date   PERICARDIAL TAP N/A 11/02/2013   Procedure: PERICARDIAL TAP;  Surgeon: TTroy Sine MD;  [facility];  Service: Cardiovascular;  Laterality: N/A;    reports that he has been smoking cigars and cigarettes. He started smoking about 7 years ago. He has never used smokeless tobacco. He reports current alcohol use. He reports that he does not use drugs. family history includes Breast cancer in his maternal grandmother; CAD in an other family member; Colon cancer in his paternal grandmother and paternal uncle; Congestive Heart Failure in his maternal grandfather; Diabetes in his brother, father, maternal grandfather, maternal grandmother, and mother; Healthy in his paternal grandfather; Heart attack in his father; Heart disease in his mother; Heart failure in his father; Hyperlipidemia in his maternal grandfather, maternal grandmother, and mother; Hypertension in his maternal grandmother and mother; Kidney cancer in his father; Kidney disease in his mother; Non-Hodgkin's lymphoma in his father; Prostate cancer in his paternal uncle; Sleep apnea in his brother. Allergies  Allergen Reactions   Insulin Nph (Human) (Isophane) Hives   Metformin And Related  Diarrhea   Current Outpatient Medications on File Prior to Visit  Medication Sig Dispense Refill   albuterol (VENTOLIN HFA) 108 (90 Base) MCG/ACT inhaler Inhale 1-2 puffs into the lungs every 6 (six) hours as needed for wheezing or shortness of breath. 8 g 1   benzonatate (TESSALON) 100 MG capsule Take 1 capsule (100 mg total) by mouth every 8 (eight) hours. 21 capsule 0   Cholecalciferol 50 MCG (2000 UT) TABS 1 tab by mouth once daily 30 tablet 99   clotrimazole-betamethasone (LOTRISONE) cream Apply 1 application topically daily. 30 g 0   Continuous Blood Gluc Receiver (DEXCOM G6 RECEIVER) DEVI Use as directed  once daily E11.9 1 each 0   Continuous Blood Gluc Sensor (DEXCOM G6 SENSOR) MISC Use as directed apply one every 10 days 3 each 11   Continuous Blood Gluc Transmit (DEXCOM G6 TRANSMITTER) MISC Use as directed once daily E11.9 1 each 11   ibuprofen (ADVIL) 800 MG tablet Take 1 tablet (800 mg total) by mouth every 8 (eight) hours as needed for moderate pain. 50 tablet 0   Lancets MISC Use as directed once daily E11.9 100 each 12   sildenafil (VIAGRA) 100 MG tablet Take 1 tablet (100 mg total) by mouth daily as needed for erectile dysfunction. 10 tablet 5   tadalafil (CIALIS) 20 MG tablet Take 0.5-1 tablets (10-20 mg total) by mouth every other day as needed for erectile dysfunction. 10 tablet 11   No current facility-administered medications on file prior to visit.        ROS:  All others reviewed and negative.  Objective        PE:  BP (!) 182/90 (BP Location: Left Arm, Patient Position: Sitting, Cuff Size: Large)   Pulse 81   Temp 98.1 F (36.7 C) (Oral)   Ht '5\' 10"'$  (1.778 m)   Wt (!) 391 lb (177.4 kg)   SpO2 99%   BMI 56.10 kg/m                 Constitutional: Pt appears in NAD               HENT: Head: NCAT.                Right Ear: External ear normal.                 Left Ear: External ear normal.                Eyes: . Pupils are equal, round, and reactive to light. Conjunctivae and EOM are normal               Nose: without d/c or deformity               Neck: Neck supple. Gross normal ROM               Cardiovascular: Normal rate and regular rhythm.                 Pulmonary/Chest: Effort normal and breath sounds without rales or wheezing.                Abd:  Soft, NT, ND, + BS, no organomegaly               Neurological: Pt is alert. At baseline orientation, motor grossly intact               Skin: Skin is warm. No rashes, no other new lesions,  LE edema - none               Psychiatric: Pt behavior is normal without agitation   Micro: none  Cardiac tracings I have  personally interpreted today:  none  Pertinent Radiological findings (summarize): none   Lab Results  Component Value Date   WBC 7.5 10/02/2022   HGB 13.2 10/02/2022   HCT 41.4 10/02/2022   PLT 216.0 10/02/2022   GLUCOSE 130 (H) 10/02/2022   CHOL 115 10/02/2022   TRIG 84.0 10/02/2022   HDL 52.70 10/02/2022   LDLDIRECT 112.0 03/22/2021   LDLCALC 46 10/02/2022   ALT 26 10/02/2022   AST 23 10/02/2022   NA 141 10/02/2022   K 4.0 10/02/2022   CL 103 10/02/2022   CREATININE 0.89 10/02/2022   BUN 18 10/02/2022   CO2 25 10/02/2022   TSH 1.53 10/02/2022   PSA 0.32 12/20/2019   INR 1.0 07/06/2020   HGBA1C 8.5 (H) 10/02/2022   MICROALBUR 2.4 (H) 10/02/2022   Assessment/Plan:  CLEVE BEYERLE is a 42 y.o. Black or African American [2] male with  has a past medical history of Cardiac tamponade (10/2013), Enlarged pulmonary artery ([place]), GERD (gastroesophageal reflux disease), HTN (hypertension), echocardiogram, LV dysfunction, Morbid obesity ([place]), Pericarditis, Sleep apnea, and Type 2 diabetes mellitus without complication, without long-term current use of insulin ([place]) (01/02/2017).  Encounter for well adult exam with abnormal findings Age and sex appropriate education and counseling updated with regular exercise and diet Referrals for preventative services - pt to call for eye exam soon Immunizations addressed - declines flu shot Smoking counseling  - pt counsled to quit, pt not ready Evidence for depression or other mood disorder - none significant Most recent labs reviewed. I have personally reviewed and have noted: 1) the patient's medical and social history 2) The patient's current medications and supplements 3) The patient's height, weight, and BMI have been recorded in the chart   Essential hypertension BP Readings from Last 3 Encounters:  10/02/22 (!) 182/90  08/03/22 (!) 159/82  03/29/22 (!) 190/102   Uncontrolled out of med tx for several wks,  pt to restart medical  treatment losartan 100 mg qd   HLD (hyperlipidemia) Lab Results  Component Value Date   LDLCALC 46 10/02/2022   Stable, pt to continue current statin lipitor 80 mg qd   Smoker Pt counsled to quit, pt not ready  Type 2 diabetes mellitus ([place]) Lab Results  Component Value Date   HGBA1C 8.5 (H) 10/02/2022   Uncontrolled, goal A1c < 7, out of meds for several wks, pt to restart medical treatment but change NPH to lantus 45 u qd, restart glipziide ER 10 qd, rybelsus 3 mg qd and refer Endo per pt request   Vitamin D deficiency Last vitamin D Lab Results  Component Value Date   VD25OH 18.87 (L) 10/02/2022   Low, to start oral replacement  Followup: Return in about 6 months (around 04/02/2023).  Cathlean Cower, MD 10/08/2022 9:30 PM [facility]

## 2022-10-03 LAB — MICROALBUMIN / CREATININE URINE RATIO
Creatinine,U: 204.4 mg/dL
Microalb Creat Ratio: 1.2 mg/g (ref 0.0–30.0)
Microalb, Ur: 2.4 mg/dL — ABNORMAL HIGH (ref 0.0–1.9)

## 2022-10-08 ENCOUNTER — Encounter: Payer: Self-pay | Admitting: Internal Medicine

## 2022-10-08 NOTE — Assessment & Plan Note (Addendum)
Lab Results  Component Value Date   HGBA1C 8.5 (H) 10/02/2022   Uncontrolled, goal A1c < 7, out of meds for several wks, pt to restart medical treatment but change NPH to lantus 45 u qd, restart glipziide ER 10 qd, rybelsus 3 mg qd and refer Endo per pt request

## 2022-10-08 NOTE — Assessment & Plan Note (Signed)
Pt counsled to quit, pt not ready °

## 2022-10-08 NOTE — Assessment & Plan Note (Signed)
Lab Results  Component Value Date   LDLCALC 46 10/02/2022   Stable, pt to continue current statin lipitor 80 mg qd

## 2022-10-08 NOTE — Assessment & Plan Note (Signed)
Age and sex appropriate education and counseling updated with regular exercise and diet Referrals for preventative services - pt to call for eye exam soon Immunizations addressed - declines flu shot Smoking counseling  - pt counsled to quit, pt not ready Evidence for depression or other mood disorder - none significant Most recent labs reviewed. I have personally reviewed and have noted: 1) the patient's medical and social history 2) The patient's current medications and supplements 3) The patient's height, weight, and BMI have been recorded in the chart

## 2022-10-08 NOTE — Assessment & Plan Note (Signed)
Last vitamin D Lab Results  Component Value Date   VD25OH 18.87 (L) 10/02/2022   Low, to start oral replacement

## 2022-10-08 NOTE — Assessment & Plan Note (Signed)
BP Readings from Last 3 Encounters:  10/02/22 (!) 182/90  08/03/22 (!) 159/82  03/29/22 (!) 190/102   Uncontrolled out of med tx for several wks,  pt to restart medical treatment losartan 100 mg qd

## 2022-11-19 ENCOUNTER — Ambulatory Visit (HOSPITAL_COMMUNITY)
Admission: EM | Admit: 2022-11-19 | Discharge: 2022-11-19 | Disposition: A | Discharge status: Home / Self Care | Payer: BC Managed Care – PPO | Attending: Family Medicine | Admitting: Family Medicine

## 2022-11-19 ENCOUNTER — Encounter (HOSPITAL_COMMUNITY): Payer: Self-pay

## 2022-11-19 DIAGNOSIS — U071 COVID-19: Secondary | ICD-10-CM

## 2022-11-19 DIAGNOSIS — R110 Nausea: Secondary | ICD-10-CM

## 2022-11-19 MED ORDER — BENZONATATE 100 MG PO CAPS: 100.0000 mg | ORAL_CAPSULE | Freq: Three times a day (TID) | ORAL | 0 refills | Status: DC

## 2022-11-19 MED ORDER — PAXLOVID (300/100) 20 X 150 MG & 10 X 100MG PO TBPK: 3.0000 | ORAL_TABLET | Freq: Two times a day (BID) | ORAL | 0 refills | Status: AC

## 2022-11-19 MED ORDER — ONDANSETRON HCL 4 MG PO TABS: 4.0000 mg | ORAL_TABLET | Freq: Four times a day (QID) | ORAL | 0 refills | Status: AC

## 2022-11-19 NOTE — ED Provider Notes (Signed)
[facility]    CSN: [id number] Arrival date & time: 11/19/22  0915      History   Chief Complaint Chief Complaint  Patient presents with   Cough    HPI Caleb [name] is a 42 y.o. male.   Patient is here for a coughing, wheezing, decreased appetite, and diarrhea.  Cold chills, off/on.  No known fevers.  Started 2 nights ago, all at the same time.  Home covid test positive today.  Slight dizziness this morning.  He is vaccinated.  Last had covid about 1 year ago.  Taking otc meds without much help.  He is taking immodium otc.   He does feel nauseated.   His bp is elevated today.  He did not take his medications yet this morning.        Past Medical History:  Diagnosis Date   Cardiac tamponade 10/2013   Enlarged pulmonary artery    a. Mild enlargement of pulmonary artery by CT 11/01/13.   GERD (gastroesophageal reflux disease)    HTN (hypertension)    Hx of echocardiogram    Echo (12/14/13):  Mod LVH, EF 50-55%, no RWMA, trivial Eff.   LV dysfunction    a. 10/2013:  EF 45-50% with followup echo 55-60%.   Morbid obesity    Pericarditis    a. 10/2013: Acute pericarditis with cardiac tamponade s/p pericardiocentesis.   Sleep apnea    Type 2 diabetes mellitus without complication, without long-term current use of insulin 01/02/2017    Patient Active Problem List   Diagnosis Date Noted   Left lateral epicondylitis 03/30/2022   Vitamin D deficiency 09/30/2021   Cough 09/27/2021   HLD (hyperlipidemia) 03/26/2021   Smoker 03/26/2021   Oropharyngeal dysphagia 10/30/2020   Hives 09/24/2020   History of COVID-19 08/21/2020   COVID-19 08/16/2020   Cellulitis of scrotum 07/06/2020   Sepsis 07/06/2020   Type 2 diabetes mellitus 07/06/2020   Urinary frequency 12/16/2019   Left carpal tunnel syndrome 12/16/2019   Nocturia more than twice per night 10/28/2018   Erectile dysfunction 10/28/2018   Encounter for well adult exam with abnormal findings 07/23/2017    GERD (gastroesophageal reflux disease)    OSA (obstructive sleep apnea) 05/16/2014   Cardiac tamponade 11/04/2013   Morbid obesity 11/04/2013   Essential hypertension 11/04/2013   Acute pericarditis 11/02/2013    Past Surgical History:  Procedure Laterality Date   PERICARDIAL TAP N/A 11/02/2013   Procedure: PERICARDIAL TAP;  Surgeon: Lennette Bihari, MD;  Location: [facility] CATH LAB;  Service: Cardiovascular;  Laterality: N/A;       Home Medications    Prior to Admission medications   Medication Sig Start Date End Date Taking? Authorizing Provider  albuterol (VENTOLIN HFA) 108 (90 Base) MCG/ACT inhaler Inhale 1-2 puffs into the lungs every 6 (six) hours as needed for wheezing or shortness of breath. 09/22/21   Prosperi, Christian H, PA-C  atorvastatin (LIPITOR) 80 MG tablet Take 1 tablet (80 mg total) by mouth daily. 10/02/22   Corwin Levins, MD  benzonatate (TESSALON) 100 MG capsule Take 1 capsule (100 mg total) by mouth every 8 (eight) hours. 08/03/22   Carlisle Beers, FNP  Cholecalciferol 50 MCG (2000 UT) TABS 1 tab by mouth once daily 09/28/21   Corwin Levins, MD  clotrimazole-betamethasone (LOTRISONE) cream Apply 1 application topically daily. 01/12/21   Corwin Levins, MD  Continuous Blood Gluc Receiver (DEXCOM G6 RECEIVER) DEVI Use as directed once daily E11.9 10/30/21  Corwin Levins, MD  Continuous Blood Gluc Sensor (DEXCOM G6 SENSOR) MISC Use as directed apply one every 10 days 02/26/22   Corwin Levins, MD  Continuous Blood Gluc Transmit (DEXCOM G6 TRANSMITTER) MISC Use as directed once daily E11.9 03/29/22   Corwin Levins, MD  glipiZIDE (GLUCOTROL XL) 10 MG 24 hr tablet Take 1 tablet (10 mg total) by mouth daily with breakfast. 10/02/22   Corwin Levins, MD  ibuprofen (ADVIL) 800 MG tablet Take 1 tablet (800 mg total) by mouth every 8 (eight) hours as needed for moderate pain. 12/16/19   Corwin Levins, MD  insulin glargine (LANTUS) 100 UNIT/ML Solostar Pen Inject 45 Units into the skin  daily. 10/02/22   Corwin Levins, MD  Lancets MISC Use as directed once daily E11.9 01/31/20   Corwin Levins, MD  losartan (COZAAR) 100 MG tablet Take 1 tablet (100 mg total) by mouth daily. 10/02/22   Corwin Levins, MD  meloxicam [facility]) 15 MG tablet 1 tab by mouth once daily as needed for pain 10/02/22   Corwin Levins, MD  pantoprazole (PROTONIX) 40 MG tablet Take 1 tablet (40 mg total) by mouth 2 (two) times daily. 10/02/22   Corwin Levins, MD  Semaglutide (RYBELSUS) 3 MG TABS Take 1 tablet (3 mg total) by mouth daily. 10/02/22   Corwin Levins, MD  sildenafil (VIAGRA) 100 MG tablet Take 1 tablet (100 mg total) by mouth daily as needed for erectile dysfunction. 08/26/22   Corwin Levins, MD  tadalafil (CIALIS) 20 MG tablet Take 0.5-1 tablets (10-20 mg total) by mouth every other day as needed for erectile dysfunction. 09/04/22   Corwin Levins, MD    Family History Family History  Problem Relation Age of Onset   Heart failure Father    Heart attack Father    Kidney cancer Father    Diabetes Father    Non-Hodgkin's lymphoma Father    Colon cancer Paternal Grandmother    Hypertension Mother    Heart disease Mother    Kidney disease Mother    Diabetes Mother    Hyperlipidemia Mother    Breast cancer Maternal Grandmother    Diabetes Maternal Grandmother    Hypertension Maternal Grandmother    Hyperlipidemia Maternal Grandmother    Diabetes Maternal Grandfather    Hyperlipidemia Maternal Grandfather    Congestive Heart Failure Maternal Grandfather    Healthy Paternal Grandfather    CAD Other        Aunt with CABG   Diabetes Brother    Colon cancer Paternal Uncle    Prostate cancer Paternal Uncle    Sleep apnea Brother    Stroke Neg Hx     Social History Social History   Tobacco Use   Smoking status: Some Days    Packs/day: 0.00    Years: 5.00    Additional pack years: 0.00    Total pack years: 0.00    Types: Cigars, Cigarettes    Start date: 2017   Smokeless tobacco: Never    Tobacco comments:    1 cigar daily  Vaping Use   Vaping Use: Never used  Substance Use Topics   Alcohol use: Yes    Comment: occasional   Drug use: No     Allergies   Insulin nph (human) (isophane) and Metformin and related   Review of Systems Review of Systems  Constitutional:  Positive for chills and fatigue.  HENT:  Positive for congestion  and rhinorrhea.   Respiratory:  Positive for cough.   Cardiovascular: Negative.   Gastrointestinal:  Positive for diarrhea and nausea.  Musculoskeletal: Negative.   Skin: Negative.   Psychiatric/Behavioral: Negative.       Physical Exam Triage Vital Signs ED Triage Vitals  Enc Vitals Group     BP 11/19/22 0941 (!) 205/108     Pulse Rate 11/19/22 0941 95     Resp 11/19/22 0941 16     Temp 11/19/22 0941 100.1 F (37.8 C)     Temp Source 11/19/22 0941 Oral     SpO2 11/19/22 0941 93 %     Weight --      Height --      Head Circumference --      Peak Flow --      Pain Score 11/19/22 0942 0     Pain Loc --      Pain Edu? --      Excl. in GC? --    No data found.  Updated Vital Signs BP (!) 205/108 (BP Location: Left Arm)   Pulse 95   Temp 100.1 F (37.8 C) (Oral)   Resp 16   SpO2 93%   Visual Acuity Right Eye Distance:   Left Eye Distance:   Bilateral Distance:    Right Eye Near:   Left Eye Near:    Bilateral Near:     Physical Exam Constitutional:      Appearance: Normal appearance.  Cardiovascular:     Rate and Rhythm: Normal rate and regular rhythm.  Pulmonary:     Effort: Pulmonary effort is normal.     Breath sounds: Normal breath sounds.  Musculoskeletal:     Cervical back: Normal range of motion and neck supple.  Skin:    General: Skin is warm.  Neurological:     General: No focal deficit present.     Mental Status: He is alert.  Psychiatric:        Mood and Affect: Mood normal.      UC Treatments / Results  Labs (all labs ordered are listed, but only abnormal results are displayed) Labs  Reviewed - No data to display  EKG   Radiology No results found.  Procedures Procedures (including critical care time)  Medications Ordered in UC Medications - No data to display  Initial Impression / Assessment and Plan / UC Course  I have reviewed the triage vital signs and the nursing notes.  Pertinent labs & imaging results that were available during my care of the patient were reviewed by me and considered in my medical decision making (see chart for details).   Patient is seen today for Covid 19 infection after home test was positive.  He is high risk with heart disease, diabetes and morbid obesity.  Renal function is normal. Discussed medications to avoid while taking paxlovid.   Final Clinical Impressions(s) / UC Diagnoses   Final diagnoses:  COVID-19 virus infection  Nausea without vomiting     Discharge Instructions      You were seen today for covid-19 infection.  I have sent out paxlovid to take twice/day x 5 days. DO NOT TAKE ATORVASTATIN OR VIAGRA WHILE TAKING THIS MEDICATION.  I have also sent out medication to help with nausea and cough.  Please get plenty of rest and increase fluids.  You may use tylenol for body aches.  Please also go home and take your blood pressure medication.     ED Prescriptions  Medication Sig Dispense Auth. Provider   benzonatate (TESSALON) 100 MG capsule Take 1 capsule (100 mg total) by mouth every 8 (eight) hours. 21 capsule Jama Krichbaum, MD   nirmatrelvir & ritonavir (PAXLOVID, 300/100,) 20 x 150 MG & 10 x 100MG  TBPK Take 3 tablets by mouth 2 (two) times daily for 5 days. 30 tablet Jaben Benegas, MD   ondansetron (ZOFRAN) 4 MG tablet Take 1 tablet (4 mg total) by mouth every 6 (six) hours. 12 tablet Jannifer Franklin, MD      PDMP not reviewed this encounter.   Jannifer Franklin, MD 11/19/22 [phone]

## 2022-11-19 NOTE — Discharge Instructions (Signed)
You were seen today for covid-19 infection.  I have sent out paxlovid to take twice/day x 5 days. DO NOT TAKE ATORVASTATIN OR VIAGRA WHILE TAKING THIS MEDICATION.  I have also sent out medication to help with nausea and cough.  Please get plenty of rest and increase fluids.  You may use tylenol for body aches.  Please also go home and take your blood pressure medication.

## 2022-11-19 NOTE — ED Triage Notes (Signed)
Pt states cough and congestion,diarrhea and chills for the past few days. States he took a Covid test this morning and it was positive.

## 2022-11-25 ENCOUNTER — Encounter: Payer: Self-pay | Admitting: *Deleted

## 2022-12-04 ENCOUNTER — Encounter: Payer: Self-pay | Admitting: Internal Medicine

## 2022-12-10 ENCOUNTER — Other Ambulatory Visit: Payer: Self-pay

## 2022-12-10 DIAGNOSIS — E1165 Type 2 diabetes mellitus with hyperglycemia: Secondary | ICD-10-CM

## 2022-12-10 MED ORDER — DEXCOM G6 TRANSMITTER MISC: 11 refills | Status: DC

## 2022-12-10 MED ORDER — DEXCOM G6 SENSOR MISC: 11 refills | Status: DC

## 2022-12-16 NOTE — Telephone Encounter (Signed)
Rec'd determnation from insurance " The request is denied because: Your plan only covers this product when you meet one of these options: A) The product works well for you, or B) Your doctor sees you every 6 months to review your treatment plan. We have denied your request because you do not meet any of these conditions. We reviewed the information we had. Your request has been denied. Sent msg to pt for status.Marland KitchenRaechel Chute

## 2022-12-16 NOTE — Telephone Encounter (Signed)
Pt need PA on the Dexcom 6 sensor. Submitted w/ (Key: [id number]), PA Case ID #: [id number], Rx #: [id number]. Waiting on insurance determination.Marland Kitchenlmb

## 2022-12-19 ENCOUNTER — Telehealth: Payer: Self-pay

## 2022-12-19 NOTE — Telephone Encounter (Signed)
PA initiated via Covermymeds; KEY: [id number]  Your PA has been resolved, no additional PA is required. For further inquiries please contact the number on the back of the member prescription card. (Message [phone])

## 2023-01-23 ENCOUNTER — Telehealth: Payer: Self-pay

## 2023-01-23 NOTE — Telephone Encounter (Signed)
Called from [facility] as patient is in for a crown and other dental work and BP was elevated.  According to their reading it was 203/125 HR 84. He was in there office yesterday and experienced elevated BP during that visit as well.  Patient told dental staff he took all medications as prescribed.  Instructed that patient would need to schedule appointment for BP evaluation and clearance.  No additional question at this time.

## 2023-01-31 ENCOUNTER — Encounter: Payer: Self-pay | Admitting: Internal Medicine

## 2023-01-31 ENCOUNTER — Ambulatory Visit: Payer: BC Managed Care – PPO | Admitting: Internal Medicine

## 2023-01-31 VITALS — BP 144/88 | HR 93 | Temp 99.0°F | Ht 70.0 in | Wt 399.0 lb

## 2023-01-31 DIAGNOSIS — I10 Essential (primary) hypertension: Secondary | ICD-10-CM | POA: Diagnosis not present

## 2023-01-31 DIAGNOSIS — E1165 Type 2 diabetes mellitus with hyperglycemia: Secondary | ICD-10-CM | POA: Diagnosis not present

## 2023-01-31 DIAGNOSIS — E7800 Pure hypercholesterolemia, unspecified: Secondary | ICD-10-CM | POA: Diagnosis not present

## 2023-01-31 DIAGNOSIS — F17200 Nicotine dependence, unspecified, uncomplicated: Secondary | ICD-10-CM

## 2023-01-31 DIAGNOSIS — E559 Vitamin D deficiency, unspecified: Secondary | ICD-10-CM

## 2023-01-31 DIAGNOSIS — Z794 Long term (current) use of insulin: Secondary | ICD-10-CM

## 2023-01-31 MED ORDER — AMLODIPINE BESYLATE 5 MG PO TABS: 5.0000 mg | ORAL_TABLET | Freq: Every day | ORAL | 3 refills | Status: DC

## 2023-01-31 MED ORDER — RYBELSUS 7 MG PO TABS
7.0000 mg | ORAL_TABLET | Freq: Every day | ORAL | 3 refills | Status: DC
Start: 2023-01-31 — End: 2023-09-05

## 2023-01-31 NOTE — Patient Instructions (Signed)
Please take all new medication as prescribed - the amlodipine 5 mg per day  Ok to increase the Rybelsus to 7 mg daily  Make sure to use the Large Cuff with BP measurements in future  Ok to try again to for treatment at Dentist  Please continue all other medications as before, and refills have been done if requested.  Please have the pharmacy call with any other refills you may need.  Please continue your efforts at being more active, low cholesterol diet, and weight control.  Please keep your appointments with your specialists as you may have planned

## 2023-01-31 NOTE — Progress Notes (Unsigned)
Patient ID: Caleb [name], male   DOB: [birth year], 42 y.o.   MRN: [id number]        Chief Complaint: follow up HTN, HLD and hyperglycemia ***       HPI:  Caleb [name] is a 42 y.o. male here with c/o        Wt Readings from Last 3 Encounters:  01/31/23 (!) 399 lb (181 kg)  10/02/22 (!) 391 lb (177.4 kg)  08/03/22 (!) 375 lb (170.1 kg)   BP Readings from Last 3 Encounters:  01/31/23 (!) 144/88  11/19/22 (!) 205/108  10/02/22 (!) 182/90         Past Medical History:  Diagnosis Date   Cardiac tamponade 10/2013   Enlarged pulmonary artery (HCC)    a. Mild enlargement of pulmonary artery by CT 11/01/13.   GERD (gastroesophageal reflux disease)    HTN (hypertension)    Hx of echocardiogram    Echo (12/14/13):  Mod LVH, EF 50-55%, no RWMA, trivial Eff.   LV dysfunction    a. 10/2013:  EF 45-50% with followup echo 55-60%.   Morbid obesity (HCC)    Pericarditis    a. 10/2013: Acute pericarditis with cardiac tamponade s/p pericardiocentesis.   Sleep apnea    Type 2 diabetes mellitus without complication, without long-term current use of insulin (HCC) 01/02/2017   Past Surgical History:  Procedure Laterality Date   PERICARDIAL TAP N/A 11/02/2013   Procedure: PERICARDIAL TAP;  Surgeon: Lennette Bihari, MD;  Location: [facility] CATH LAB;  Service: Cardiovascular;  Laterality: N/A;    reports that he has been smoking cigars and cigarettes. He started smoking about 7 years ago. He has never used smokeless tobacco. He reports current alcohol use. He reports that he does not use drugs. family history includes Breast cancer in his maternal grandmother; CAD in an other family member; Colon cancer in his paternal grandmother and paternal uncle; Congestive Heart Failure in his maternal grandfather; Diabetes in his brother, father, maternal grandfather, maternal grandmother, and mother; Healthy in his paternal grandfather; Heart attack in his father; Heart disease in his mother; Heart failure in his father;  Hyperlipidemia in his maternal grandfather, maternal grandmother, and mother; Hypertension in his maternal grandmother and mother; Kidney cancer in his father; Kidney disease in his mother; Non-Hodgkin's lymphoma in his father; Prostate cancer in his paternal uncle; Sleep apnea in his brother. Allergies  Allergen Reactions   Insulin Nph (Human) (Isophane) Hives   Metformin And Related Diarrhea   Current Outpatient Medications on File Prior to Visit  Medication Sig Dispense Refill   albuterol (VENTOLIN HFA) 108 (90 Base) MCG/ACT inhaler Inhale 1-2 puffs into the lungs every 6 (six) hours as needed for wheezing or shortness of breath. 8 g 1   atorvastatin (LIPITOR) 80 MG tablet Take 1 tablet (80 mg total) by mouth daily. 90 tablet 3   benzonatate (TESSALON) 100 MG capsule Take 1 capsule (100 mg total) by mouth every 8 (eight) hours. 21 capsule 0   Cholecalciferol 50 MCG (2000 UT) TABS 1 tab by mouth once daily 30 tablet 99   clotrimazole-betamethasone (LOTRISONE) cream Apply 1 application topically daily. 30 g 0   Continuous Blood Gluc Receiver (DEXCOM G6 RECEIVER) DEVI Use as directed once daily E11.9 1 each 0   Continuous Glucose Sensor (DEXCOM G6 SENSOR) MISC Use as directed apply one every 10 days 3 each 11   Continuous Glucose Transmitter (DEXCOM G6 TRANSMITTER) MISC Use as directed once daily E11.9 1  each 11   glipiZIDE (GLUCOTROL XL) 10 MG 24 hr tablet Take 1 tablet (10 mg total) by mouth daily with breakfast. 90 tablet 3   ibuprofen (ADVIL) 800 MG tablet Take 1 tablet (800 mg total) by mouth every 8 (eight) hours as needed for moderate pain. 50 tablet 0   insulin glargine (LANTUS) 100 UNIT/ML Solostar Pen Inject 45 Units into the skin daily. 15 mL 11   Lancets MISC Use as directed once daily E11.9 100 each 12   losartan (COZAAR) 100 MG tablet Take 1 tablet (100 mg total) by mouth daily. 90 tablet 3   meloxicam (MOBIC) 15 MG tablet 1 tab by mouth once daily as needed for pain 90 tablet 3    ondansetron (ZOFRAN) 4 MG tablet Take 1 tablet (4 mg total) by mouth every 6 (six) hours. 12 tablet 0   pantoprazole (PROTONIX) 40 MG tablet Take 1 tablet (40 mg total) by mouth 2 (two) times daily. 180 tablet 3   Semaglutide (RYBELSUS) 3 MG TABS Take 1 tablet (3 mg total) by mouth daily. 90 tablet 3   sildenafil (VIAGRA) 100 MG tablet Take 1 tablet (100 mg total) by mouth daily as needed for erectile dysfunction. 10 tablet 5   tadalafil (CIALIS) 20 MG tablet Take 0.5-1 tablets (10-20 mg total) by mouth every other day as needed for erectile dysfunction. 10 tablet 11   No current facility-administered medications on file prior to visit.        ROS:  All others reviewed and negative.  Objective        PE:  BP (!) 144/88 (BP Location: Right Arm, Patient Position: Sitting, Cuff Size: Normal)   Pulse 93   Temp 99 F (37.2 C) (Oral)   Ht 5\' 10"  (1.778 m)   Wt (!) 399 lb (181 kg)   SpO2 99%   BMI 57.25 kg/m                 Constitutional: Pt appears in NAD               HENT: Head: NCAT.                Right Ear: External ear normal.                 Left Ear: External ear normal.                Eyes: . Pupils are equal, round, and reactive to light. Conjunctivae and EOM are normal               Nose: without d/c or deformity               Neck: Neck supple. Gross normal ROM               Cardiovascular: Normal rate and regular rhythm.                 Pulmonary/Chest: Effort normal and breath sounds without rales or wheezing.                Abd:  Soft, NT, ND, + BS, no organomegaly               Neurological: Pt is alert. At baseline orientation, motor grossly intact               Skin: Skin is warm. No rashes, no other new lesions, LE edema - ***  Psychiatric: Pt behavior is normal without agitation   Micro: none  Cardiac tracings I have personally interpreted today:  none  Pertinent Radiological findings (summarize): none   Lab Results  Component Value Date   WBC  7.5 10/02/2022   HGB 13.2 10/02/2022   HCT 41.4 10/02/2022   PLT 216.0 10/02/2022   GLUCOSE 130 (H) 10/02/2022   CHOL 115 10/02/2022   TRIG 84.0 10/02/2022   HDL 52.70 10/02/2022   LDLDIRECT 112.0 03/22/2021   LDLCALC 46 10/02/2022   ALT 26 10/02/2022   AST 23 10/02/2022   NA 141 10/02/2022   K 4.0 10/02/2022   CL 103 10/02/2022   CREATININE 0.89 10/02/2022   BUN 18 10/02/2022   CO2 25 10/02/2022   TSH 1.53 10/02/2022   PSA 0.32 12/20/2019   INR 1.0 07/06/2020   HGBA1C 8.5 (H) 10/02/2022   MICROALBUR 2.4 (H) 10/02/2022   Assessment/Plan:  Caleb [name] is a 42 y.o. Black or African American [2] male with  has a past medical history of Cardiac tamponade (10/2013), Enlarged pulmonary artery (HCC), GERD (gastroesophageal reflux disease), HTN (hypertension), echocardiogram, LV dysfunction, Morbid obesity (HCC), Pericarditis, Sleep apnea, and Type 2 diabetes mellitus without complication, without long-term current use of insulin (HCC) (01/02/2017).  No problem-specific Assessment & Plan notes found for this encounter.  Followup: No follow-ups on file.  Oliver Barre, MD 01/31/2023 1:17 PM [facility]

## 2023-02-02 ENCOUNTER — Encounter: Payer: Self-pay | Admitting: Internal Medicine

## 2023-02-02 NOTE — Assessment & Plan Note (Signed)
For increase rybelsus 7 mg daily

## 2023-02-02 NOTE — Assessment & Plan Note (Signed)
With appropriate cuff today, BP still mild uncontrolled  BP Readings from Last 3 Encounters:  01/31/23 (!) 144/88  11/19/22 (!) 205/108  10/02/22 (!) 182/90   Pt to add amlodipine 5 mg daily, has f/u dental appt in 2 wks

## 2023-02-02 NOTE — Assessment & Plan Note (Signed)
Lab Results  Component Value Date   HGBA1C 8.5 (H) 10/02/2022   uncontrolled pt to continue current medical treatment glucotrol xl 10 every day, lantus 45 u every day, but increase rybelsus to 7 mg daily to improve sugar and wt control

## 2023-02-02 NOTE — Assessment & Plan Note (Signed)
Lab Results  Component Value Date   LDLCALC 46 10/02/2022   Stable, pt to continue current statin lipitor 80 qd

## 2023-02-02 NOTE — Assessment & Plan Note (Signed)
Last vitamin D Lab Results  Component Value Date   VD25OH 18.87 (L) 10/02/2022   Low, to start oral replacement  

## 2023-02-02 NOTE — Assessment & Plan Note (Signed)
Pt counsled to quit, pt not ready °

## 2023-03-12 ENCOUNTER — Encounter (INDEPENDENT_AMBULATORY_CARE_PROVIDER_SITE_OTHER): Payer: Self-pay

## 2023-04-02 ENCOUNTER — Ambulatory Visit: Payer: BC Managed Care – PPO | Admitting: Internal Medicine

## 2023-04-22 ENCOUNTER — Other Ambulatory Visit (HOSPITAL_COMMUNITY): Payer: Self-pay

## 2023-04-22 ENCOUNTER — Telehealth: Payer: Self-pay

## 2023-04-22 NOTE — Telephone Encounter (Signed)
Pharmacy Patient Advocate Encounter   Received notification from CoverMyMeds that prior authorization for Rybelsus is required/requested.   Insurance verification completed.   The patient is insured through [employer] [facility] .   Per test claim: PA required; PA submitted to [employer] [facility] via CoverMyMeds Key/confirmation #/EOC Key: [id number]  Status is pending

## 2023-04-23 ENCOUNTER — Other Ambulatory Visit (HOSPITAL_COMMUNITY): Payer: Self-pay

## 2023-04-23 NOTE — Telephone Encounter (Signed)
Pharmacy Patient Advocate Encounter  Received notification from [employer] [facility] that Prior Authorization for Rybelsus 7MG  tablets has been APPROVED from 04-22-2023 to 04-21-2026. Ran test claim, Copay is $14.99 for a 30 day supply OR $44.99 for 90 day supply. This test claim was processed through [facility]- copay amounts may vary at other pharmacies due to pharmacy/plan contracts, or as the patient moves through the different stages of their insurance plan.   PA #/Case ID/Reference #: [id number]

## 2023-09-04 ENCOUNTER — Encounter: Payer: Self-pay | Admitting: Internal Medicine

## 2023-09-04 ENCOUNTER — Ambulatory Visit (INDEPENDENT_AMBULATORY_CARE_PROVIDER_SITE_OTHER): Payer: 59 | Admitting: Internal Medicine

## 2023-09-04 ENCOUNTER — Other Ambulatory Visit: Payer: Self-pay

## 2023-09-04 ENCOUNTER — Other Ambulatory Visit: Payer: Self-pay | Admitting: Internal Medicine

## 2023-09-04 VITALS — BP 120/78 | HR 85 | Temp 98.5°F | Ht 70.0 in | Wt >= 6400 oz

## 2023-09-04 DIAGNOSIS — E7800 Pure hypercholesterolemia, unspecified: Secondary | ICD-10-CM

## 2023-09-04 DIAGNOSIS — I10 Essential (primary) hypertension: Secondary | ICD-10-CM

## 2023-09-04 DIAGNOSIS — M5441 Lumbago with sciatica, right side: Secondary | ICD-10-CM | POA: Diagnosis not present

## 2023-09-04 DIAGNOSIS — Z125 Encounter for screening for malignant neoplasm of prostate: Secondary | ICD-10-CM

## 2023-09-04 DIAGNOSIS — M25512 Pain in left shoulder: Secondary | ICD-10-CM | POA: Diagnosis not present

## 2023-09-04 DIAGNOSIS — Z0001 Encounter for general adult medical examination with abnormal findings: Secondary | ICD-10-CM

## 2023-09-04 DIAGNOSIS — M7022 Olecranon bursitis, left elbow: Secondary | ICD-10-CM

## 2023-09-04 DIAGNOSIS — Z0000 Encounter for general adult medical examination without abnormal findings: Secondary | ICD-10-CM

## 2023-09-04 DIAGNOSIS — Z794 Long term (current) use of insulin: Secondary | ICD-10-CM | POA: Diagnosis not present

## 2023-09-04 DIAGNOSIS — E538 Deficiency of other specified B group vitamins: Secondary | ICD-10-CM | POA: Diagnosis not present

## 2023-09-04 DIAGNOSIS — E1165 Type 2 diabetes mellitus with hyperglycemia: Secondary | ICD-10-CM | POA: Diagnosis not present

## 2023-09-04 DIAGNOSIS — E559 Vitamin D deficiency, unspecified: Secondary | ICD-10-CM

## 2023-09-04 DIAGNOSIS — F17200 Nicotine dependence, unspecified, uncomplicated: Secondary | ICD-10-CM

## 2023-09-04 LAB — MICROALBUMIN / CREATININE URINE RATIO
Creatinine,U: 121.7 mg/dL
Microalb Creat Ratio: 0.9 mg/g (ref 0.0–30.0)
Microalb, Ur: 1.1 mg/dL (ref 0.0–1.9)

## 2023-09-04 LAB — CBC WITH DIFFERENTIAL/PLATELET
Basophils Absolute: 0 10*3/uL (ref 0.0–0.1)
Basophils Relative: 0.8 % (ref 0.0–3.0)
Eosinophils Absolute: 0.2 10*3/uL (ref 0.0–0.7)
Eosinophils Relative: 3.2 % (ref 0.0–5.0)
HCT: 44.3 % (ref 39.0–52.0)
Hemoglobin: 13.9 g/dL (ref 13.0–17.0)
Lymphocytes Relative: 45 % (ref 12.0–46.0)
Lymphs Abs: 2.6 10*3/uL (ref 0.7–4.0)
MCHC: 31.4 g/dL (ref 30.0–36.0)
MCV: 74 fL — ABNORMAL LOW (ref 78.0–100.0)
Monocytes Absolute: 0.4 10*3/uL (ref 0.1–1.0)
Monocytes Relative: 7.5 % (ref 3.0–12.0)
Neutro Abs: 2.5 10*3/uL (ref 1.4–7.7)
Neutrophils Relative %: 43.5 % (ref 43.0–77.0)
Platelets: 213 10*3/uL (ref 150.0–400.0)
RBC: 5.99 Mil/uL — ABNORMAL HIGH (ref 4.22–5.81)
RDW: 15 % (ref 11.5–15.5)
WBC: 5.7 10*3/uL (ref 4.0–10.5)

## 2023-09-04 LAB — URINALYSIS, ROUTINE W REFLEX MICROSCOPIC
Bilirubin Urine: NEGATIVE
Hgb urine dipstick: NEGATIVE
Leukocytes,Ua: NEGATIVE
Nitrite: NEGATIVE
RBC / HPF: NONE SEEN (ref 0–?)
Specific Gravity, Urine: 1.02 (ref 1.000–1.030)
Total Protein, Urine: NEGATIVE
Urine Glucose: >=1000 — AB
Urobilinogen, UA: 1 (ref 0.0–1.0)
pH: 6.5 (ref 5.0–8.0)

## 2023-09-04 LAB — BASIC METABOLIC PANEL
BUN: 16 mg/dL (ref 6–23)
CO2: 30 meq/L (ref 19–32)
Calcium: 9.3 mg/dL (ref 8.4–10.5)
Chloride: 101 meq/L (ref 96–112)
Creatinine, Ser: 0.91 mg/dL (ref 0.40–1.50)
GFR: 105.72 mL/min (ref >60.00)
Glucose, Bld: 245 mg/dL — ABNORMAL HIGH (ref 70–99)
Potassium: 4.6 meq/L (ref 3.5–5.1)
Sodium: 137 meq/L (ref 135–145)

## 2023-09-04 LAB — LIPID PANEL
Cholesterol: 132 mg/dL (ref 0–200)
HDL: 52.8 mg/dL (ref >39.00)
LDL Cholesterol: 62 mg/dL (ref 0–99)
NonHDL: 79.68
Total CHOL/HDL Ratio: 3
Triglycerides: 86 mg/dL (ref 0.0–149.0)
VLDL: 17.2 mg/dL (ref 0.0–40.0)

## 2023-09-04 LAB — HEPATIC FUNCTION PANEL
ALT: 25 U/L (ref 0–53)
AST: 17 U/L (ref 0–37)
Albumin: 4.4 g/dL (ref 3.5–5.2)
Alkaline Phosphatase: 55 U/L (ref 39–117)
Bilirubin, Direct: 0.1 mg/dL (ref 0.0–0.3)
Total Bilirubin: 0.5 mg/dL (ref 0.2–1.2)
Total Protein: 7.1 g/dL (ref 6.0–8.3)

## 2023-09-04 LAB — HEMOGLOBIN A1C: Hgb A1c MFr Bld: 11.1 % — ABNORMAL HIGH (ref 4.6–6.5)

## 2023-09-04 LAB — VITAMIN B12: Vitamin B-12: 834 pg/mL (ref 211–911)

## 2023-09-04 LAB — VITAMIN D 25 HYDROXY (VIT D DEFICIENCY, FRACTURES): VITD: 18.47 ng/mL — ABNORMAL LOW (ref 30.00–100.00)

## 2023-09-04 LAB — TSH: TSH: 1.04 u[IU]/mL (ref 0.35–5.50)

## 2023-09-04 LAB — PSA: PSA: 0.45 ng/mL (ref 0.10–4.00)

## 2023-09-04 MED ORDER — DEXCOM G6 SENSOR MISC
11 refills | Status: AC
Start: 2023-09-04 — End: ?

## 2023-09-04 MED ORDER — TIRZEPATIDE 2.5 MG/0.5ML ~~LOC~~ SOAJ
2.5000 mg | SUBCUTANEOUS | 11 refills | Status: DC
Start: 2023-09-04 — End: 2023-12-01

## 2023-09-04 NOTE — Progress Notes (Signed)
Patient ID: Caleb [name], male   DOB: Dec 03, 1980, 43 y.o.   MRN: [id number]         Chief Complaint:: wellness exam and left elbow bursitis, right lower back pain, left shoulder pain, dm, htn,  hld, low vit d       HPI:  Caleb [name] is a 43 y.o. male here for wellness exam; declines pneumovax, flu shot, o/w up to date                     Also Pt denies chest pain, increased sob or doe, wheezing, orthopnea, PND, increased LE swelling, palpitations, dizziness or syncope.   Pt denies polydipsia, polyuria, or new focal neuro s/s.    Pt denies fever, wt loss, night sweats, loss of appetite, or other constitutional symptoms  Has pain and swelling to tip of left elbow for 1 mo,  Has sore tenderness to left shoulder for several wks,  has right lower back pain radiation mild intermittent with tingling to right upper leg.  Willing to start mounjaro 2.5 mg.     Wt Readings from Last 3 Encounters:  09/04/23 (!) 405 lb (183.7 kg)  01/31/23 (!) 399 lb (181 kg)  10/02/22 (!) 391 lb (177.4 kg)   BP Readings from Last 3 Encounters:  09/04/23 120/78  01/31/23 (!) 144/88  11/19/22 (!) 205/108   Immunization History  Administered Date(s) Administered   Influenza,inj,Quad PF,6+ Mos 11/04/2013   Moderna Sars-Covid-2 Vaccination 12/04/2019, 01/01/2020   PPD Test 08/16/2021, 08/25/2021   Tdap 01/06/2016, 11/21/2017   Health Maintenance Due  Topic Date Due   Pneumococcal Vaccine 53-60 Years old (1 of 2 - PCV) Never done      Past Medical History:  Diagnosis Date   Cardiac tamponade 10/2013   Enlarged pulmonary artery (HCC)    a. Mild enlargement of pulmonary artery by CT 11/01/13.   GERD (gastroesophageal reflux disease)    HTN (hypertension)    Hx of echocardiogram    Echo (12/14/13):  Mod LVH, EF 50-55%, no RWMA, trivial Eff.   LV dysfunction    a. 10/2013:  EF 45-50% with followup echo 55-60%.   Morbid obesity (HCC)    Pericarditis    a. 10/2013: Acute pericarditis with cardiac tamponade s/p  pericardiocentesis.   Sleep apnea    Type 2 diabetes mellitus without complication, without long-term current use of insulin (HCC) 01/02/2017   Past Surgical History:  Procedure Laterality Date   PERICARDIAL TAP N/A 11/02/2013   Procedure: PERICARDIAL TAP;  Surgeon: Lennette Bihari, MD;  Location: [facility] CATH LAB;  Service: Cardiovascular;  Laterality: N/A;    reports that he has been smoking cigars and cigarettes. He started smoking about 8 years ago. He has never used smokeless tobacco. He reports current alcohol use. He reports that he does not use drugs. family history includes Breast cancer in his maternal grandmother; CAD in an other family member; Colon cancer in his paternal grandmother and paternal uncle; Congestive Heart Failure in his maternal grandfather; Diabetes in his brother, father, maternal grandfather, maternal grandmother, and mother; Healthy in his paternal grandfather; Heart attack in his father; Heart disease in his mother; Heart failure in his father; Hyperlipidemia in his maternal grandfather, maternal grandmother, and mother; Hypertension in his maternal grandmother and mother; Kidney cancer in his father; Kidney disease in his mother; Non-Hodgkin's lymphoma in his father; Prostate cancer in his paternal uncle; Sleep apnea in his brother. Allergies  Allergen Reactions  Insulin Nph (Human) (Isophane) Hives   Metformin And Related Diarrhea   Current Outpatient Medications on File Prior to Visit  Medication Sig Dispense Refill   albuterol (VENTOLIN HFA) 108 (90 Base) MCG/ACT inhaler Inhale 1-2 puffs into the lungs every 6 (six) hours as needed for wheezing or shortness of breath. 8 g 1   Cholecalciferol 50 MCG (2000 UT) TABS 1 tab by mouth once daily 30 tablet 99   clotrimazole-betamethasone (LOTRISONE) cream Apply 1 application topically daily. 30 g 0   Continuous Blood Gluc Receiver (DEXCOM G6 RECEIVER) DEVI Use as directed once daily E11.9 1 each 0   Continuous Glucose  Transmitter (DEXCOM G6 TRANSMITTER) MISC Use as directed once daily E11.9 1 each 11   ibuprofen (ADVIL) 800 MG tablet Take 1 tablet (800 mg total) by mouth every 8 (eight) hours as needed for moderate pain. 50 tablet 0   Lancets MISC Use as directed once daily E11.9 100 each 12   meloxicam (MOBIC) 15 MG tablet 1 tab by mouth once daily as needed for pain 90 tablet 3   ondansetron (ZOFRAN) 4 MG tablet Take 1 tablet (4 mg total) by mouth every 6 (six) hours. 12 tablet 0   sildenafil (VIAGRA) 100 MG tablet Take 1 tablet (100 mg total) by mouth daily as needed for erectile dysfunction. 10 tablet 5   tadalafil (CIALIS) 20 MG tablet Take 0.5-1 tablets (10-20 mg total) by mouth every other day as needed for erectile dysfunction. 10 tablet 11   No current facility-administered medications on file prior to visit.        ROS:  All others reviewed and negative.  Objective        PE:  BP 120/78 (BP Location: Right Arm, Patient Position: Sitting, Cuff Size: Normal)   Pulse 85   Temp 98.5 F (36.9 C) (Oral)   Ht 5\' 10"  (1.778 m)   Wt (!) 405 lb (183.7 kg)   SpO2 98%   BMI 58.11 kg/m                 Constitutional: Pt appears in NAD               HENT: Head: NCAT.                Right Ear: External ear normal.                 Left Ear: External ear normal.                Eyes: . Pupils are equal, round, and reactive to light. Conjunctivae and EOM are normal               Nose: without d/c or deformity               Neck: Neck supple. Gross normal ROM               Cardiovascular: Normal rate and regular rhythm.                 Pulmonary/Chest: Effort normal and breath sounds without rales or wheezing.                Abd:  Soft, NT, ND, + BS, no organomegaly               Neurological: Pt is alert. At baseline orientation, motor grossly intact               Skin: Skin is  warm. No rashes, no other new lesions, LE edema - none               Psychiatric: Pt behavior is normal without agitation    Micro: none  Cardiac tracings I have personally interpreted today:  none  Pertinent Radiological findings (summarize): none   Lab Results  Component Value Date   WBC 5.7 09/04/2023   HGB 13.9 09/04/2023   HCT 44.3 09/04/2023   PLT 213.0 09/04/2023   GLUCOSE 245 (H) 09/04/2023   CHOL 132 09/04/2023   TRIG 86.0 09/04/2023   HDL 52.80 09/04/2023   LDLDIRECT 112.0 03/22/2021   LDLCALC 62 09/04/2023   ALT 25 09/04/2023   AST 17 09/04/2023   NA 137 09/04/2023   K 4.6 09/04/2023   CL 101 09/04/2023   CREATININE 0.91 09/04/2023   BUN 16 09/04/2023   CO2 30 09/04/2023   TSH 1.04 09/04/2023   PSA 0.45 09/04/2023   INR 1.0 07/06/2020   HGBA1C 11.1 (H) 09/04/2023   MICROALBUR 1.1 09/04/2023   Assessment/Plan:  Caleb [name] is a 43 y.o. Black or African American [2] male with  has a past medical history of Cardiac tamponade (10/2013), Enlarged pulmonary artery (HCC), GERD (gastroesophageal reflux disease), HTN (hypertension), echocardiogram, LV dysfunction, Morbid obesity (HCC), Pericarditis, Sleep apnea, and Type 2 diabetes mellitus without complication, without long-term current use of insulin (HCC) (01/02/2017).  Encounter for well adult exam with abnormal findings Age and sex appropriate education and counseling updated with regular exercise and diet Referrals for preventative services - none needed Immunizations addressed - declines pneumovax, flu shot Smoking counseling  - pt counsled to quit, pt not ready Evidence for depression or other mood disorder - none significant Most recent labs reviewed. I have personally reviewed and have noted: 1) the patient's medical and social history 2) The patient's current medications and supplements 3) The patient's height, weight, and BMI have been recorded in the chart   Essential hypertension BP Readings from Last 3 Encounters:  09/04/23 120/78  01/31/23 (!) 144/88  11/19/22 (!) 205/108   Stable, pt to continue medical  treatment norvasc 5 every day, losartan 100 qd   HLD (hyperlipidemia) Lab Results  Component Value Date   LDLCALC 62 09/04/2023   Stable, pt to continue current statin lipitor 80 qd   Smoker Pt counsled to quit, pt nto ready  Type 2 diabetes mellitus (HCC) Lab Results  Component Value Date   HGBA1C 11.1 (H) 09/04/2023   Severe uncontrolled, to start mounjaro 2.5 g weekly, pt to continue current medical treatment glucotrol xl 10 every day, norvasc, 5 every day, losartan 100 every day, consider endo referral but pt has declined the last 3 yrs   Vitamin D deficiency Last vitamin D Lab Results  Component Value Date   VD25OH 18.47 (L) 09/04/2023   Low, to start oral replacement   Left shoulder pain C/w probable tendonitis, for volt gel prn, prednisone taper, sport med referral  Lower back pain C/w mild neuritic possible,  for volt gel prn, prednisone taper, sport med referral  Bursitis of left elbow Improving, benign, d/w pt-  for volt gel prn, prednisone taper, sport med referral  Followup: Return in about 6 months (around 03/03/2024).  Oliver Barre, MD 09/08/2023 8:55 PM [facility]

## 2023-09-04 NOTE — Patient Instructions (Addendum)
Your left elbow bursitis has improved  Your left shoulder bursitis is still active, so Please take all new medication as prescribed - the prednisone for a short time, and use the OTC Voltaren gel as needed topically, and see Sports Medicine on the first floor here if not better in 1-2 wks  You can also use the gel on the lower back as needed  Please take all new medication as prescribed - the mounjaro 2.5 mg weekly, but also do the [facility] MESSSAGE IN 4 WEEKS if you are doing ok with nausea and constipation, to go up to the next dose  Please continue all other medications as before, and refills have been done if requested.  Please have the pharmacy call with any other refills you may need.  Please continue your efforts at being more active, low cholesterol diet, and weight control.  You are otherwise up to date with prevention measures today.  Please keep your appointments with your specialists as you may have planned  Please go to the LAB at the blood drawing area for the tests to be done  You will be contacted by phone if any changes need to be made immediately.  Otherwise, you will receive a letter about your results with an explanation, but please check with MyChart first.  Please make an Appointment to return in 6 months, or sooner if needed

## 2023-09-05 MED ORDER — LOSARTAN POTASSIUM 100 MG PO TABS: 100.0000 mg | ORAL_TABLET | Freq: Every day | ORAL | 3 refills | Status: AC

## 2023-09-05 MED ORDER — PANTOPRAZOLE SODIUM 40 MG PO TBEC: 40.0000 mg | DELAYED_RELEASE_TABLET | Freq: Two times a day (BID) | ORAL | 3 refills | Status: AC

## 2023-09-05 MED ORDER — ATORVASTATIN CALCIUM 80 MG PO TABS: 80.0000 mg | ORAL_TABLET | Freq: Every day | ORAL | 3 refills | Status: AC

## 2023-09-05 MED ORDER — GLIPIZIDE ER 10 MG PO TB24: 10.0000 mg | ORAL_TABLET | Freq: Every day | ORAL | 3 refills | Status: AC

## 2023-09-05 MED ORDER — INSULIN GLARGINE 100 UNIT/ML SOLOSTAR PEN: 45.0000 [IU] | PEN_INJECTOR | Freq: Every day | SUBCUTANEOUS | 11 refills | Status: AC

## 2023-09-05 MED ORDER — AMLODIPINE BESYLATE 5 MG PO TABS: 5.0000 mg | ORAL_TABLET | Freq: Every day | ORAL | 3 refills | Status: AC

## 2023-09-06 ENCOUNTER — Encounter: Payer: Self-pay | Admitting: Internal Medicine

## 2023-09-08 ENCOUNTER — Telehealth: Payer: Self-pay

## 2023-09-08 ENCOUNTER — Encounter: Payer: Self-pay | Admitting: Internal Medicine

## 2023-09-08 ENCOUNTER — Other Ambulatory Visit (HOSPITAL_COMMUNITY): Payer: Self-pay

## 2023-09-08 DIAGNOSIS — M25512 Pain in left shoulder: Secondary | ICD-10-CM | POA: Insufficient documentation

## 2023-09-08 DIAGNOSIS — M545 Low back pain, unspecified: Secondary | ICD-10-CM | POA: Insufficient documentation

## 2023-09-08 DIAGNOSIS — M7032 Other bursitis of elbow, left elbow: Secondary | ICD-10-CM | POA: Insufficient documentation

## 2023-09-08 NOTE — Assessment & Plan Note (Signed)
C/w probable tendonitis, for volt gel prn, prednisone taper, sport med referral

## 2023-09-08 NOTE — Assessment & Plan Note (Signed)
C/w mild neuritic possible,  for volt gel prn, prednisone taper, sport med referral

## 2023-09-08 NOTE — Assessment & Plan Note (Signed)
Pt counsled to quit, pt nto ready

## 2023-09-08 NOTE — Assessment & Plan Note (Signed)
Lab Results  Component Value Date   LDLCALC 62 09/04/2023   Stable, pt to continue current statin lipitor 80 qd

## 2023-09-08 NOTE — Assessment & Plan Note (Addendum)
Age and sex appropriate education and counseling updated with regular exercise and diet Referrals for preventative services - none needed Immunizations addressed - declines pneumovax, flu shot Smoking counseling  - pt counsled to quit, pt not ready Evidence for depression or other mood disorder - none significant Most recent labs reviewed. I have personally reviewed and have noted: 1) the patient's medical and social history 2) The patient's current medications and supplements 3) The patient's height, weight, and BMI have been recorded in the chart

## 2023-09-08 NOTE — Assessment & Plan Note (Signed)
Lab Results  Component Value Date   HGBA1C 11.1 (H) 09/04/2023   Severe uncontrolled, to start mounjaro 2.5 g weekly, pt to continue current medical treatment glucotrol xl 10 every day, norvasc, 5 every day, losartan 100 every day, consider endo referral but pt has declined the last 3 yrs

## 2023-09-08 NOTE — Assessment & Plan Note (Signed)
Improving, benign, d/w pt-  for volt gel prn, prednisone taper, sport med referral

## 2023-09-08 NOTE — Telephone Encounter (Signed)
Populated questions have been answered and submitted via CMM>

## 2023-09-08 NOTE — Assessment & Plan Note (Signed)
Last vitamin D Lab Results  Component Value Date   VD25OH 18.47 (L) 09/04/2023   Low, to start oral replacement

## 2023-09-08 NOTE — Assessment & Plan Note (Signed)
BP Readings from Last 3 Encounters:  09/04/23 120/78  01/31/23 (!) 144/88  11/19/22 (!) 205/108   Stable, pt to continue medical treatment norvasc 5 every day, losartan 100 qd

## 2023-09-08 NOTE — Telephone Encounter (Signed)
Pharmacy Patient Advocate Encounter   Received notification from  [employer] Portal that prior authorization for [facility] 2.5mg /0.5 auto injector is required/requested.   Insurance verification completed.   The patient is insured through [facility] .   Per test claim: PA required; PA started via CoverMyMeds. KEY [id number] . Waiting for clinical questions to populate.

## 2023-09-09 ENCOUNTER — Other Ambulatory Visit (HOSPITAL_COMMUNITY): Payer: Self-pay

## 2023-09-09 NOTE — Telephone Encounter (Signed)
Pharmacy Patient Advocate Encounter  Received notification from [employer] [facility] that Prior Authorization for [facility] 2.5MG /0.5ML auto-injectors has been APPROVED from 09/08/23 to 09/06/26. Ran test claim, Copay is $30. This test claim was processed through [facility]- copay amounts may vary at other pharmacies due to pharmacy/plan contracts, or as the patient moves through the different stages of their insurance plan.   PA #/Case ID/Reference #:  [id number]

## 2023-10-08 ENCOUNTER — Encounter: Payer: Self-pay | Admitting: Internal Medicine

## 2023-10-09 ENCOUNTER — Other Ambulatory Visit: Payer: Self-pay | Admitting: Internal Medicine

## 2023-10-09 MED ORDER — SILDENAFIL CITRATE 100 MG PO TABS: 100.0000 mg | ORAL_TABLET | Freq: Every day | ORAL | 11 refills | Status: AC | PRN

## 2023-11-23 ENCOUNTER — Other Ambulatory Visit: Payer: Self-pay | Admitting: Internal Medicine

## 2023-11-24 ENCOUNTER — Other Ambulatory Visit: Payer: Self-pay

## 2023-11-30 ENCOUNTER — Encounter: Payer: Self-pay | Admitting: Internal Medicine

## 2023-12-01 MED ORDER — TIRZEPATIDE 5 MG/0.5ML ~~LOC~~ SOAJ: 5.0000 mg | SUBCUTANEOUS | 3 refills | Status: DC

## 2023-12-16 ENCOUNTER — Encounter: Payer: Self-pay | Admitting: Internal Medicine

## 2023-12-20 ENCOUNTER — Other Ambulatory Visit: Payer: Self-pay | Admitting: Internal Medicine

## 2023-12-20 DIAGNOSIS — E1165 Type 2 diabetes mellitus with hyperglycemia: Secondary | ICD-10-CM

## 2023-12-22 ENCOUNTER — Other Ambulatory Visit: Payer: Self-pay

## 2023-12-26 ENCOUNTER — Telehealth: Payer: Self-pay

## 2023-12-26 ENCOUNTER — Other Ambulatory Visit (HOSPITAL_COMMUNITY): Payer: Self-pay

## 2023-12-26 NOTE — Telephone Encounter (Signed)
 Pharmacy Patient Advocate Encounter   Received notification from CoverMyMeds that prior authorization for Dexcom G6 Transmitter is required/requested.   Insurance verification completed.   The patient is insured through [employer] .   Per test claim: PA required; PA submitted to above mentioned insurance via CoverMyMeds Key/confirmation #/EOC [id number] Status is pending

## 2023-12-26 NOTE — Telephone Encounter (Signed)
 Pharmacy Patient Advocate Encounter  Received notification from [facility] that Prior Authorization for Dexcom G6 Transmitter has been APPROVED from 12/26/23 to 12/25/24. Ran test claim, Copay is $90 for a 90 day supply. This test claim was processed through [facility]- copay amounts may vary at other pharmacies due to pharmacy/plan contracts, or as the patient moves through the different stages of their insurance plan.   PA #/Case ID/Reference #: [id number]

## 2023-12-29 ENCOUNTER — Other Ambulatory Visit (HOSPITAL_COMMUNITY): Payer: Self-pay

## 2024-01-03 ENCOUNTER — Other Ambulatory Visit: Payer: Self-pay | Admitting: Internal Medicine

## 2024-01-03 DIAGNOSIS — E1165 Type 2 diabetes mellitus with hyperglycemia: Secondary | ICD-10-CM

## 2024-01-06 ENCOUNTER — Other Ambulatory Visit: Payer: Self-pay

## 2024-01-16 ENCOUNTER — Encounter: Payer: Self-pay | Admitting: Internal Medicine

## 2024-01-16 DIAGNOSIS — M25522 Pain in left elbow: Secondary | ICD-10-CM

## 2024-02-02 ENCOUNTER — Other Ambulatory Visit: Payer: Self-pay | Admitting: Internal Medicine

## 2024-02-02 ENCOUNTER — Other Ambulatory Visit: Payer: Self-pay

## 2024-02-27 ENCOUNTER — Ambulatory Visit

## 2024-02-27 ENCOUNTER — Ambulatory Visit: Admitting: Family Medicine

## 2024-02-27 ENCOUNTER — Other Ambulatory Visit: Payer: Self-pay

## 2024-02-27 VITALS — BP 130/80 | HR 77 | Ht 70.0 in | Wt 370.4 lb

## 2024-02-27 DIAGNOSIS — Z794 Long term (current) use of insulin: Secondary | ICD-10-CM

## 2024-02-27 DIAGNOSIS — M7022 Olecranon bursitis, left elbow: Secondary | ICD-10-CM

## 2024-02-27 DIAGNOSIS — M25522 Pain in left elbow: Secondary | ICD-10-CM

## 2024-02-27 DIAGNOSIS — E1165 Type 2 diabetes mellitus with hyperglycemia: Secondary | ICD-10-CM | POA: Diagnosis not present

## 2024-02-27 LAB — COMPREHENSIVE METABOLIC PANEL WITH GFR
ALT: 27 U/L (ref 0–53)
AST: 26 U/L (ref 0–37)
Albumin: 4.5 g/dL (ref 3.5–5.2)
Alkaline Phosphatase: 43 U/L (ref 39–117)
BUN: 12 mg/dL (ref 6–23)
CO2: 27 meq/L (ref 19–32)
Calcium: 9.2 mg/dL (ref 8.4–10.5)
Chloride: 103 meq/L (ref 96–112)
Creatinine, Ser: 0.98 mg/dL (ref 0.40–1.50)
GFR: 96.4 mL/min (ref >60.00)
Glucose, Bld: 100 mg/dL — ABNORMAL HIGH (ref 70–99)
Potassium: 4 meq/L (ref 3.5–5.1)
Sodium: 137 meq/L (ref 135–145)
Total Bilirubin: 0.6 mg/dL (ref 0.2–1.2)
Total Protein: 7.4 g/dL (ref 6.0–8.3)

## 2024-02-27 LAB — URIC ACID: Uric Acid, Serum: 4.5 mg/dL (ref 4.0–7.8)

## 2024-02-27 LAB — HEMOGLOBIN A1C: Hgb A1c MFr Bld: 6.7 % — ABNORMAL HIGH (ref 4.6–6.5)

## 2024-02-27 NOTE — Progress Notes (Signed)
 I, Claretha Schimke am a scribe for Dr. Artist Lloyd, MD.  Caleb [name] is a 43 y.o. male who presents to [facility] today for L elbow pain x for a long time now. Pt locates pain to middle of elbow. It gets tight. Drained his elbow and there was clear liquid with a little blood in it. The pain still there despite personally draining the fluid. Started with tennis elbow and was given meloxicam .  As noted above he did drain his own elbow bursa using sterile insulin  needles previously.  This occurred months ago and was successful but it recurred.  He denies any redness or fevers or chills or induration around the site of the bursitis.  Swelling:yes Treatments tried:meloxicam , goody powders,   Pertinent review of systems: No fevers or chills  Relevant historical information: Hypertension.  Diabetes. Diabetes previously uncontrolled.  Now much better controlled with a continuous glucometer.  He denies blood sugars routinely over 200.  It has been 4 to 5 months since his last A1c.   Exam:  BP 130/80   Pulse 77   Ht 5' 10 (1.778 m)   Wt (!) 370 lb 6.4 oz (168 kg)   SpO2 95%   BMI 53.15 kg/m  General: Well Developed, well nourished, and in no acute distress.   MSK: Left elbow large olecranon bursa swelling.  Nontender to palpation no erythema or induration.  Normal elbow motion.    Lab and Radiology Results  Procedure: Real-time Ultrasound Guided aspiration and injection of left olecranon bursitis Device: Philips Affiniti 50G/GE Logiq Images permanently stored and available for review in PACS Verbal informed consent obtained.  Discussed risks and benefits of procedure. Warned about infection, bleeding, hyperglycemia damage to structures among others. Patient expresses understanding and agreement Time-out conducted.   Noted no overlying erythema, induration, or other signs of local infection.   Skin prepped in a sterile fashion.   Local  anesthesia: Topical Ethyl chloride.   With sterile technique and under real time ultrasound guidance: 2 mL of lidocaine  injected into the cutaneous tissue achieving good anesthesia. Skin again sterilized with isopropyl alcohol and an 18-gauge needle was used to access the bursa. 13 mL of dark blood colored fluid aspirated. Syringe exchange and 40 mg of Kenalog and 2 mL's of Marcaine injected into the now decompressed hemorrhagic bursa..   Completed without difficulty   Pain immediately resolved suggesting accurate placement of the medication.   Advised to call if fevers/chills, erythema, induration, drainage, or persistent bleeding.   Images permanently stored and available for review in the ultrasound unit.  Impression: Technically successful ultrasound guided injection.   X-ray images left elbow obtained today personally and independently interpreted. No acute fractures.  No severe degenerative changes. 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  It is hemorrhagic type based on contents of fluid removed.  No sign of infection today therefore will not send the fluid for culture or cell count differential and crystal analysis.  He is at risk for gout so we will go ahead and check uric acid and metabolic panel.  For the bursitis aspiration and injection and compression.  Recheck in 1 month with my partner.  Additionally there is some debate of how well-controlled his diabetes is.  When last checked on September 04, 2023 it was very high at 49.  Will go ahead and check A1c again since it has been  over 3 months and I am sure Dr. Norleen (his PCP) would appreciate that.   PDMP not reviewed this encounter. Orders Placed This Encounter  Procedures   US  LIMITED JOINT SPACE STRUCTURES UP LEFT(NO LINKED CHARGES)    Reason for Exam (SYMPTOM  OR DIAGNOSIS REQUIRED):   elbow pain    Preferred imaging location?:   [facility]   US  LIMITED JOINT SPACE STRUCTURES UP LEFT(NO LINKED CHARGES)    Reason for Exam (SYMPTOM  OR DIAGNOSIS REQUIRED):   elbow pain    Preferred imaging location?:   [facility]   DG ELBOW COMPLETE LEFT (3+VIEW)    Standing Status:   Future    Number of Occurrences:   1    Expiration Date:   03/29/2024    Reason for Exam (SYMPTOM  OR DIAGNOSIS REQUIRED):   left elbow pain    Preferred imaging location?:   [place]   Uric acid    Standing Status:   Future    Number of Occurrences:   1    Expiration Date:   02/26/2025   Comprehensive metabolic panel with GFR    Standing Status:   Future    Number of Occurrences:   1    Expiration Date:   02/26/2025   Hemoglobin A1c    Standing Status:   Future    Number of Occurrences:   1    Expiration Date:   06/29/2024   No orders of the defined types were placed in this encounter.    Discussed warning signs or symptoms. Please see discharge instructions. Patient expresses understanding.   The above documentation has been reviewed and is accurate and complete Artist Lloyd, M.D.

## 2024-02-27 NOTE — Patient Instructions (Signed)
 Thank you for coming in today.   Please get an Xray today before you leave   You received an injection today. Seek immediate medical attention if the joint becomes red, extremely painful, or is oozing fluid.   Compression  Please get labs today before you leave   Check back with Dr. Leonce in 1 month

## 2024-02-29 ENCOUNTER — Ambulatory Visit: Payer: Self-pay | Admitting: Family Medicine

## 2024-02-29 NOTE — Progress Notes (Signed)
 Diabetes looks great. Uric acid looks good and gout and unlikely.

## 2024-03-03 ENCOUNTER — Ambulatory Visit: Payer: 59 | Admitting: Internal Medicine

## 2024-03-04 NOTE — Progress Notes (Signed)
 Left elbow x-ray shows some swelling and a little spur at the tip of the elbow but otherwise looks okay.

## 2024-03-05 ENCOUNTER — Ambulatory Visit: Admitting: Internal Medicine

## 2024-03-25 NOTE — Progress Notes (Deleted)
 Ben Jackson D[facility] [address] [place] [address] Phone: [phone]   Assessment and Plan:     There are no diagnoses linked to this encounter.  ***   Pertinent previous records reviewed include ***    Follow Up: ***     Subjective:   I, Caleb [name], am serving as a Neurosurgeon for Doctor Morene Mace  Chief Complaint: left elbow pain   HPI:  02/27/2024 Caleb [name] is a 43 y.o. male who presents to [facility] at [facility] today for L elbow pain x for a long time now. Pt locates pain to middle of elbow. It gets tight. Drained his elbow and there was clear liquid with a little blood in it. The pain still there despite personally draining the fluid. Started with tennis elbow and was given meloxicam .  As noted above he did drain his own elbow bursa using sterile insulin  needles previously.  This occurred months ago and was successful but it recurred.  He denies any redness or fevers or chills or induration around the site of the bursitis.   Swelling:yes Treatments tried:meloxicam , goody powders,    Pertinent review of systems: No fevers or chills   Relevant historical information: Hypertension.  Diabetes. Diabetes previously uncontrolled.  Now much better controlled with a continuous glucometer.  He denies blood sugars routinely over 200.  It has been 4 to 5 months since his last A1c.     Exam:  BP 130/80   Pulse 77   Ht 5' 10 (1.778 m)   Wt (!) 370 lb 6.4 oz (168 kg)   SpO2 95%   BMI 53.15 kg/m  General: Well Developed, well nourished, and in no acute distress.    MSK: Left elbow large olecranon bursa swelling.  Nontender to palpation no erythema or induration.  Normal elbow motion.  03/26/2024 Patient states   Relevant Historical Information: ***  Additional pertinent review of systems negative.   Current Outpatient Medications:    albuterol  (VENTOLIN  HFA) 108 (90 Base) MCG/ACT inhaler,  Inhale 1-2 puffs into the lungs every 6 (six) hours as needed for wheezing or shortness of breath., Disp: 8 g, Rfl: 1   amLODipine  (NORVASC ) 5 MG tablet, Take 1 tablet (5 mg total) by mouth daily., Disp: 90 tablet, Rfl: 3   atorvastatin  (LIPITOR) 80 MG tablet, Take 1 tablet (80 mg total) by mouth daily., Disp: 90 tablet, Rfl: 3   Cholecalciferol  50 MCG (2000 UT) TABS, 1 tab by mouth once daily, Disp: 30 tablet, Rfl: 99   clotrimazole -betamethasone  (LOTRISONE ) cream, Apply 1 application topically daily., Disp: 30 g, Rfl: 0   Continuous Blood Gluc Receiver (DEXCOM G6 RECEIVER) DEVI, Use as directed once daily E11.9, Disp: 1 each, Rfl: 0   Continuous Glucose Sensor (DEXCOM G6 SENSOR) MISC, Use as directed apply one every 10 days, Disp: 3 each, Rfl: 11   Continuous Glucose Transmitter (DEXCOM G6 TRANSMITTER) MISC, USE AS DIRECTED --ONCE  A  DAY, Disp: 1 each, Rfl: 0   glipiZIDE  (GLUCOTROL  XL) 10 MG 24 hr tablet, Take 1 tablet (10 mg total) by mouth daily with breakfast., Disp: 90 tablet, Rfl: 3   ibuprofen  (ADVIL ) 800 MG tablet, Take 1 tablet (800 mg total) by mouth every 8 (eight) hours as needed for moderate pain., Disp: 50 tablet, Rfl: 0   insulin  glargine (LANTUS ) 100 UNIT/ML Solostar Pen, Inject 45 Units into the skin daily., Disp: 15 mL, Rfl: 11   Lancets  MISC, Use as directed once daily E11.9, Disp: 100 each, Rfl: 12   losartan  (COZAAR ) 100 MG tablet, Take 1 tablet (100 mg total) by mouth daily., Disp: 90 tablet, Rfl: 3   meloxicam  (MOBIC ) 15 MG tablet, TAKE 1 TABLET BY MOUTH ONCE DAILY AS NEEDED FOR PAIN, Disp: 90 tablet, Rfl: 0   ondansetron  (ZOFRAN ) 4 MG tablet, Take 1 tablet (4 mg total) by mouth every 6 (six) hours., Disp: 12 tablet, Rfl: 0   pantoprazole  (PROTONIX ) 40 MG tablet, Take 1 tablet (40 mg total) by mouth 2 (two) times daily., Disp: 180 tablet, Rfl: 3   sildenafil  (VIAGRA ) 100 MG tablet, Take 1 tablet (100 mg total) by mouth daily as needed for erectile dysfunction., Disp: 10  tablet, Rfl: 11   tadalafil  (CIALIS ) 20 MG tablet, Take 0.5-1 tablets (10-20 mg total) by mouth every other day as needed for erectile dysfunction., Disp: 10 tablet, Rfl: 11   tirzepatide  (MOUNJARO ) 5 MG/0.5ML Pen, Inject 5 mg into the skin once a week., Disp: 6 mL, Rfl: 3   Objective:     There were no vitals filed for this visit.    There is no height or weight on file to calculate BMI.    Physical Exam:    ***   Electronically signed by:  [name] [facility] 7:42 AM 03/25/24

## 2024-03-26 ENCOUNTER — Ambulatory Visit: Admitting: Sports Medicine

## 2024-04-13 ENCOUNTER — Encounter: Payer: Self-pay | Admitting: Sports Medicine

## 2024-05-12 ENCOUNTER — Other Ambulatory Visit: Payer: Self-pay | Admitting: Internal Medicine

## 2024-05-12 ENCOUNTER — Other Ambulatory Visit: Payer: Self-pay

## 2024-05-12 ENCOUNTER — Encounter: Payer: Self-pay | Admitting: Internal Medicine

## 2024-05-12 MED ORDER — TIRZEPATIDE 7.5 MG/0.5ML ~~LOC~~ SOAJ: 7.5000 mg | SUBCUTANEOUS | 3 refills | Status: AC

## 2024-07-14 ENCOUNTER — Other Ambulatory Visit: Payer: Self-pay | Admitting: Internal Medicine

## 2024-07-18 ENCOUNTER — Encounter (HOSPITAL_COMMUNITY): Payer: Self-pay

## 2024-07-18 ENCOUNTER — Ambulatory Visit (HOSPITAL_COMMUNITY)
Admission: EM | Admit: 2024-07-18 | Discharge: 2024-07-18 | Disposition: A | Discharge status: Home / Self Care | Attending: Internal Medicine | Admitting: Internal Medicine

## 2024-07-18 ENCOUNTER — Other Ambulatory Visit: Payer: Self-pay

## 2024-07-18 ENCOUNTER — Ambulatory Visit (HOSPITAL_COMMUNITY): Payer: Self-pay

## 2024-07-18 ENCOUNTER — Emergency Department (HOSPITAL_COMMUNITY)
Admission: EM | Admit: 2024-07-18 | Discharge: 2024-07-18 | Disposition: A | Discharge status: Home / Self Care | Source: Ambulatory Visit | Attending: Emergency Medicine | Admitting: Emergency Medicine

## 2024-07-18 ENCOUNTER — Emergency Department (HOSPITAL_COMMUNITY)

## 2024-07-18 DIAGNOSIS — M79662 Pain in left lower leg: Secondary | ICD-10-CM

## 2024-07-18 DIAGNOSIS — M79605 Pain in left leg: Secondary | ICD-10-CM

## 2024-07-18 LAB — COMPREHENSIVE METABOLIC PANEL WITH GFR
ALT: 16 U/L (ref 0–44)
AST: 19 U/L (ref 15–41)
Albumin: 3.8 g/dL (ref 3.5–5.0)
Alkaline Phosphatase: 38 U/L (ref 38–126)
Anion gap: 8 (ref 5–15)
BUN: 16 mg/dL (ref 6–20)
CO2: 27 mmol/L (ref 22–32)
Calcium: 8.4 mg/dL — ABNORMAL LOW (ref 8.9–10.3)
Chloride: 104 mmol/L (ref 98–111)
Creatinine, Ser: 1.18 mg/dL (ref 0.61–1.24)
GFR, Estimated: >60 mL/min (ref >60)
Glucose, Bld: 99 mg/dL (ref 70–99)
Potassium: 4.1 mmol/L (ref 3.5–5.1)
Sodium: 139 mmol/L (ref 135–145)
Total Bilirubin: 0.5 mg/dL (ref 0.0–1.2)
Total Protein: 7 g/dL (ref 6.5–8.1)

## 2024-07-18 LAB — CBC
HCT: 42.9 % (ref 39.0–52.0)
Hemoglobin: 13.7 g/dL (ref 13.0–17.0)
MCH: 23.2 pg — ABNORMAL LOW (ref 26.0–34.0)
MCHC: 31.9 g/dL (ref 30.0–36.0)
MCV: 72.7 fL — ABNORMAL LOW (ref 80.0–100.0)
Platelets: 290 K/uL (ref 150–400)
RBC: 5.9 MIL/uL — ABNORMAL HIGH (ref 4.22–5.81)
RDW: 15.9 % — ABNORMAL HIGH (ref 11.5–15.5)
WBC: 6.6 K/uL (ref 4.0–10.5)
nRBC: 0 % (ref 0.0–0.2)

## 2024-07-18 MED ORDER — KETOROLAC TROMETHAMINE 15 MG/ML IJ SOLN
15.0000 mg | Freq: Once | INTRAMUSCULAR | Status: AC
  Administered 2024-07-18: 15 mg via INTRAMUSCULAR
  Filled 2024-07-18: qty 1

## 2024-07-18 MED ORDER — CEPHALEXIN 500 MG PO CAPS: 500.0000 mg | ORAL_CAPSULE | Freq: Four times a day (QID) | ORAL | 0 refills | Status: AC

## 2024-07-18 NOTE — ED Triage Notes (Signed)
 Pt having left leg pain and swelling since Friday, States was leaning into the back of his truck when he heard a popping sound. Sent here by UC to rule out a DVT.

## 2024-07-18 NOTE — ED Notes (Signed)
 Patient is being discharged from the Urgent Care and sent to the Emergency Department via personal opperated vehicle . Per Dorothyann Silk NP, patient is in need of higher level of care due to possible DVT. Patient is aware and verbalizes understanding of plan of care.  Vitals:   07/18/24 1004  BP: (!) 150/92  Pulse: 83  Resp: 18  Temp: 98.5 F (36.9 C)  SpO2: 96%

## 2024-07-18 NOTE — ED Provider Notes (Signed)
 [place] EMERGENCY DEPARTMENT AT [facility] Provider Note   CSN: [id number] Arrival date & time: 07/18/24  1122     Patient presents with: Leg Pain   Caleb [name] is a 43 y.o. male.    Leg Pain  Patient is a 43 year old male  Caleb [name] is a 43 y.o. male presenting for chief complaint of left leg that started 3 days ago on Friday, July 16, 2024.  Patient was leaning over the backseat of his truck trying to bend a head rest forward on the seat of his third row of his car when he suddenly felt a pop to the left calf resulting in immediate pain to the left calf.  He does not remember if he was on his tiptoes when he was attempting to reach the third row but doubts this.  He denies direct trauma to the left calf.  He has had pain with weightbearing activity to the left posterior calf since injury.  States the left calf has been swollen, slightly red, and very tender to palpation over the last 24 to 48 hours.  He denies paresthesias distally to the left lower extremity, history of DVT, history of malignancy, and long periods of travel/sedentary activity. He has not had any fever, cold chills, or open wounds to the left calf.  Taking ibuprofen , Tylenol , and using lidocaine  patches to the calf without any relief of pain.     Prior to Admission medications   Medication Sig Start Date End Date Taking? Authorizing Provider  cephALEXin  (KEFLEX ) 500 MG capsule Take 1 capsule (500 mg total) by mouth 4 (four) times daily. 07/18/24  Yes Clarice Zulauf, Hamp S, PA  albuterol  (VENTOLIN  HFA) 108 (90 Base) MCG/ACT inhaler Inhale 1-2 puffs into the lungs every 6 (six) hours as needed for wheezing or shortness of breath. 09/22/21   Prosperi, Christian H, PA-C  amLODipine  (NORVASC ) 5 MG tablet Take 1 tablet (5 mg total) by mouth daily. 09/05/23 09/04/24  Norleen Lynwood ORN, MD  atorvastatin  (LIPITOR) 80 MG tablet Take 1 tablet (80 mg total) by mouth daily. 09/05/23   Norleen Lynwood ORN, MD   Cholecalciferol  50 MCG (2000 UT) TABS 1 tab by mouth once daily 09/28/21   Norleen Lynwood ORN, MD  clotrimazole -betamethasone  (LOTRISONE ) cream Apply 1 application topically daily. 01/12/21   Norleen Lynwood ORN, MD  Continuous Blood Gluc Receiver (DEXCOM G6 RECEIVER) DEVI Use as directed once daily E11.9 10/30/21   Norleen Lynwood ORN, MD  Continuous Glucose Sensor (DEXCOM G6 SENSOR) MISC Use as directed apply one every 10 days 09/04/23   Norleen Lynwood ORN, MD  Continuous Glucose Transmitter (DEXCOM G6 TRANSMITTER) MISC USE AS DIRECTED --ONCE  A  DAY 01/06/24   Norleen Lynwood ORN, MD  glipiZIDE  (GLUCOTROL  XL) 10 MG 24 hr tablet Take 1 tablet (10 mg total) by mouth daily with breakfast. 09/05/23   Norleen Lynwood ORN, MD  ibuprofen  (ADVIL ) 800 MG tablet Take 1 tablet (800 mg total) by mouth every 8 (eight) hours as needed for moderate pain. 12/16/19   Norleen Lynwood ORN, MD  insulin  glargine (LANTUS ) 100 UNIT/ML Solostar Pen Inject 45 Units into the skin daily. 09/05/23   Norleen Lynwood ORN, MD  Lancets MISC Use as directed once daily E11.9 01/31/20   Norleen Lynwood ORN, MD  losartan  (COZAAR ) 100 MG tablet Take 1 tablet (100 mg total) by mouth daily. 09/05/23   Norleen Lynwood ORN, MD  meloxicam  (MOBIC ) 15 MG tablet TAKE 1 TABLET BY MOUTH ONCE  DAILY AS NEEDED FOR PAIN 07/14/24   Norleen Lynwood ORN, MD  ondansetron  (ZOFRAN ) 4 MG tablet Take 1 tablet (4 mg total) by mouth every 6 (six) hours. 11/19/22   Piontek, Rocky, MD  pantoprazole  (PROTONIX ) 40 MG tablet Take 1 tablet (40 mg total) by mouth 2 (two) times daily. 09/05/23   Norleen Lynwood ORN, MD  sildenafil  (VIAGRA ) 100 MG tablet Take 1 tablet (100 mg total) by mouth daily as needed for erectile dysfunction. 10/09/23   Norleen Lynwood ORN, MD  tadalafil  (CIALIS ) 20 MG tablet Take 0.5-1 tablets (10-20 mg total) by mouth every other day as needed for erectile dysfunction. 09/04/22   Norleen Lynwood ORN, MD  tirzepatide  (MOUNJARO ) 7.5 MG/0.5ML Pen Inject 7.5 mg into the skin once a week. 05/12/24   Norleen Lynwood ORN, MD    Allergies: Insulin   nph (human) (isophane) and Metformin  and related    Review of Systems  Updated Vital Signs BP (!) 150/99 (BP Location: Right Arm)   Pulse 75   Temp 98.5 F (36.9 C) (Oral)   Resp 15   Ht 5' 10 (1.778 m)   Wt (!) 141.1 kg   SpO2 100%   BMI 44.62 kg/m   Physical Exam Vitals and nursing note reviewed.  Constitutional:      General: He is not in acute distress. HENT:     Head: Normocephalic and atraumatic.     Nose: Nose normal.  Eyes:     General: No scleral icterus. Cardiovascular:     Rate and Rhythm: Normal rate and regular rhythm.     Pulses: Normal pulses.     Heart sounds: Normal heart sounds.  Pulmonary:     Effort: Pulmonary effort is normal. No respiratory distress.     Breath sounds: No wheezing.  Abdominal:     Palpations: Abdomen is soft.     Tenderness: There is no abdominal tenderness.  Musculoskeletal:     Cervical back: Normal range of motion.     Comments: Left calf with some warmth and tenderness to touch of the calf. Bilateral DP PT pulses normal and symmetric sensation normal in bilateral lower extremities  Skin:    General: Skin is warm and dry.     Capillary Refill: Capillary refill takes less than 2 seconds.  Neurological:     Mental Status: He is alert. Mental status is at baseline.  Psychiatric:        Mood and Affect: Mood normal.        Behavior: Behavior normal.     (all labs ordered are listed, but only abnormal results are displayed) Labs Reviewed  COMPREHENSIVE METABOLIC PANEL WITH GFR - Abnormal; Notable for the following components:      Result Value   Calcium  8.4 (*)    All other components within normal limits  CBC - Abnormal; Notable for the following components:   RBC 5.90 (*)    MCV 72.7 (*)    MCH 23.2 (*)    RDW 15.9 (*)    All other components within normal limits    EKG: None  Radiology: VAS US  LOWER EXTREMITY VENOUS (DVT) (7a-7p) Result Date: 07/18/2024  Lower Venous DVT Study Patient Name:  Caleb [name]   Date of Exam:   07/18/2024 Medical Rec #: [id number]         Accession #:    [id number] Date of Birth: 06-23-1981        Patient Gender: M Patient Age:   42 years Exam  Location:  [facility] Procedure:      VAS US  LOWER EXTREMITY VENOUS (DVT) Referring Phys: HAMP Leightyn Cina --------------------------------------------------------------------------------  Indications: Swelling, Pain, and Patient states he heard a popping sound after leaning back onto his truck.  Comparison Study: No priors. Performing Technologist: Ricka Sturdivant-Jones RDMS, RVT  Examination Guidelines: A complete evaluation includes B-mode imaging, spectral Doppler, color Doppler, and power Doppler as needed of all accessible portions of each vessel. Bilateral testing is considered an integral part of a complete examination. Limited examinations for reoccurring indications may be performed as noted. The reflux portion of the exam is performed with the patient in reverse Trendelenburg.  +-----+---------------+---------+-----------+----------+--------------+ RIGHTCompressibilityPhasicitySpontaneityPropertiesThrombus Aging +-----+---------------+---------+-----------+----------+--------------+ CFV  Full           Yes      Yes                                 +-----+---------------+---------+-----------+----------+--------------+ SFJ  Full                                                        +-----+---------------+---------+-----------+----------+--------------+   +---------+---------------+---------+-----------+----------+--------------+ LEFT     CompressibilityPhasicitySpontaneityPropertiesThrombus Aging +---------+---------------+---------+-----------+----------+--------------+ CFV      Full           Yes      Yes                                 +---------+---------------+---------+-----------+----------+--------------+ SFJ      Full                                                         +---------+---------------+---------+-----------+----------+--------------+ FV Prox  Full                                                        +---------+---------------+---------+-----------+----------+--------------+ FV Mid   Full           Yes      Yes                                 +---------+---------------+---------+-----------+----------+--------------+ FV DistalFull                                                        +---------+---------------+---------+-----------+----------+--------------+ PFV      Full                                                        +---------+---------------+---------+-----------+----------+--------------+ POP  Full           Yes      Yes                                 +---------+---------------+---------+-----------+----------+--------------+ PTV      Full                                                        +---------+---------------+---------+-----------+----------+--------------+ PERO     Full                                                        +---------+---------------+---------+-----------+----------+--------------+ Gastroc  None           No       No                   Acute          +---------+---------------+---------+-----------+----------+--------------+    Summary: RIGHT: - No evidence of common femoral vein obstruction.   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 - There is no evidence of deep vein thrombosis in the lower extremity.  - No cystic structure found in the popliteal fossa.  *See table(s) above for measurements and observations.    Preliminary      Procedures   Medications Ordered in the ED  ketorolac  (TORADOL ) 15 MG/ML injection 15 mg (15 mg Intramuscular Given 07/18/24 1427)    Clinical Course as of 07/18/24 1634  Sun Jul 18, 2024  1302  LEFT: Findings consistent with acute intramuscular thrombosis involving the  left gastrocnemius veins.   - There is no evidence of deep vein thrombosis in the lower extremity.   - No cystic structure found in the popliteal fossa.    *See table(s) above for measurements and observations.   [WF]  1415 Vasc FU  NSAIDs ACE wrap [WF]    Clinical Course User Index [WF] Neldon Hamp RAMAN, PA                                 Medical Decision Making Amount and/or Complexity of Data Reviewed Labs: ordered.  Risk Prescription drug management.   Caleb [name] is a 43 y.o. male presenting for chief complaint of left leg that started 3 days ago on Friday, July 16, 2024.  Patient was leaning over the backseat of his truck trying to bend a head rest forward on the seat of his third row of his car when he suddenly felt a pop to the left calf resulting in immediate pain to the left calf.  He does not remember if he was on his tiptoes when he was attempting to reach the third row but doubts this.  He denies direct trauma to the left calf.  He has had pain with weightbearing activity to the left posterior calf since injury.  States the left calf has been swollen, slightly red, and very tender to palpation over the last 24 to 48 hours.  He denies paresthesias distally to  the left lower extremity, history of DVT, history of malignancy, and long periods of travel/sedentary activity. He has not had any fever, cold chills, or open wounds to the left calf.  Taking ibuprofen , Tylenol , and using lidocaine  patches to the calf without any relief of pain.  I discussed this case with my attending physician who cosigned this note including patient's presenting symptoms, physical exam, and planned diagnostics and interventions. Attending physician stated agreement with plan or made changes to plan which were implemented.    No leukocytosis or anemia labs normal.  Intramuscular thrombosis of the left gastrocnemius veins but otherwise normal ultrasound.  I wonder if this could be  incidental finding versus perhaps some degree of trauma related thrombosis?   Certainly some warmth and tenderness will treat with Keflex  to cover cellulitis and recommend follow-up with vascular.  Return precautions emergency room provided specifically for any worsening symptoms.   Final diagnoses:  Left leg pain    ED Discharge Orders          Ordered    cephALEXin  (KEFLEX ) 500 MG capsule  4 times daily        07/18/24 1418               Neldon Inoue Fernley, [place] 07/18/24 1635    Francesca Elsie CROME, MD 07/19/24 1515

## 2024-07-18 NOTE — ED Triage Notes (Signed)
 Patient presenting with left leg pain. States was leaning into the back of his truck when he heard a popping sound. Pain since then. Denies any history of issues with this leg. Onset this past Friday.   Patient tried hydrocortisone  patches, tiger balm, muscle relaxer and ibuprofen  with slight relief.

## 2024-07-18 NOTE — ED Provider Notes (Signed)
 [facility]    CSN: [id number] Arrival date & time: 07/18/24  9089      History   Chief Complaint Chief Complaint  Patient presents with   Leg Pain    HPI Caleb [name] is a 43 y.o. male.   Caleb [name] is a 43 y.o. male presenting for chief complaint of left leg that started 3 days ago on Friday, July 16, 2024.  Patient was leaning over the backseat of his truck trying to bend a head rest forward on the seat of his third row of his car when he suddenly felt a pop to the left calf resulting in immediate pain to the left calf.  He does not remember if he was on his tiptoes when he was attempting to reach the third row but doubts this.  He denies direct trauma to the left calf.  He has had pain with weightbearing activity to the left posterior calf since injury.  States the left calf has been swollen, slightly red, and very tender to palpation over the last 24 to 48 hours.  He denies paresthesias distally to the left lower extremity, history of DVT, history of malignancy, and long periods of travel/sedentary activity. He has not had any fever, cold chills, or open wounds to the left calf.  Taking ibuprofen , Tylenol , and using lidocaine  patches to the calf without any relief of pain.   Leg Pain   Past Medical History:  Diagnosis Date   Cardiac tamponade 10/2013   Enlarged pulmonary artery (HCC)    a. Mild enlargement of pulmonary artery by CT 11/01/13.   GERD (gastroesophageal reflux disease)    HTN (hypertension)    Hx of echocardiogram    Echo (12/14/13):  Mod LVH, EF 50-55%, no RWMA, trivial Eff.   LV dysfunction    a. 10/2013:  EF 45-50% with followup echo 55-60%.   Morbid obesity (HCC)    Pericarditis    a. 10/2013: Acute pericarditis with cardiac tamponade s/p pericardiocentesis.   Sleep apnea    Type 2 diabetes mellitus without complication, without long-term current use of insulin  (HCC) 01/02/2017    Patient Active Problem List   Diagnosis Date Noted    Left shoulder pain 09/08/2023   Lower back pain 09/08/2023   Bursitis of left elbow 09/08/2023   Vitamin D  deficiency 09/30/2021   Cough 09/27/2021   HLD (hyperlipidemia) 03/26/2021   Smoker 03/26/2021   Oropharyngeal dysphagia 10/30/2020   Hives 09/24/2020   History of COVID-19 08/21/2020   COVID-19 08/16/2020   Cellulitis of scrotum 07/06/2020   Sepsis (HCC) 07/06/2020   Type 2 diabetes mellitus (HCC) 07/06/2020   Urinary frequency 12/16/2019   Left carpal tunnel syndrome 12/16/2019   Nocturia more than twice per night 10/28/2018   Erectile dysfunction 10/28/2018   Encounter for well adult exam with abnormal findings 07/23/2017   GERD (gastroesophageal reflux disease)    OSA (obstructive sleep apnea) 05/16/2014   Cardiac tamponade 11/04/2013   Morbid obesity (HCC) 11/04/2013   Essential hypertension 11/04/2013   Acute pericarditis 11/02/2013    Past Surgical History:  Procedure Laterality Date   PERICARDIAL TAP N/A 11/02/2013   Procedure: PERICARDIAL TAP;  Surgeon: Debby DELENA Sor, MD;  Location: [facility] CATH LAB;  Service: Cardiovascular;  Laterality: N/A;       Home Medications    Prior to Admission medications   Medication Sig Start Date End Date Taking? Authorizing Provider  amLODipine  (NORVASC ) 5 MG tablet Take 1 tablet (5  mg total) by mouth daily. 09/05/23 09/04/24 Yes Norleen Lynwood ORN, MD  atorvastatin  (LIPITOR) 80 MG tablet Take 1 tablet (80 mg total) by mouth daily. 09/05/23  Yes Norleen Lynwood ORN, MD  glipiZIDE  (GLUCOTROL  XL) 10 MG 24 hr tablet Take 1 tablet (10 mg total) by mouth daily with breakfast. 09/05/23  Yes Norleen Lynwood ORN, MD  ibuprofen  (ADVIL ) 800 MG tablet Take 1 tablet (800 mg total) by mouth every 8 (eight) hours as needed for moderate pain. 12/16/19  Yes Norleen Lynwood ORN, MD  losartan  (COZAAR ) 100 MG tablet Take 1 tablet (100 mg total) by mouth daily. 09/05/23  Yes Norleen Lynwood ORN, MD  meloxicam  (MOBIC ) 15 MG tablet TAKE 1 TABLET BY MOUTH ONCE DAILY AS NEEDED FOR PAIN  07/14/24  Yes Norleen Lynwood ORN, MD  pantoprazole  (PROTONIX ) 40 MG tablet Take 1 tablet (40 mg total) by mouth 2 (two) times daily. 09/05/23  Yes Norleen Lynwood ORN, MD  tirzepatide  (MOUNJARO ) 7.5 MG/0.5ML Pen Inject 7.5 mg into the skin once a week. 05/12/24  Yes Norleen Lynwood ORN, MD  albuterol  (VENTOLIN  HFA) 108 [phone] Base) MCG/ACT inhaler Inhale 1-2 puffs into the lungs every 6 (six) hours as needed for wheezing or shortness of breath. 09/22/21   Prosperi, Sherlean DEL, PA-C  Cholecalciferol  50 MCG (2000 UT) TABS 1 tab by mouth once daily 09/28/21   Norleen Lynwood ORN, MD  clotrimazole -betamethasone  (LOTRISONE ) cream Apply 1 application topically daily. 01/12/21   Norleen Lynwood ORN, MD  Continuous Blood Gluc Receiver (DEXCOM G6 RECEIVER) DEVI Use as directed once daily E11.9 10/30/21   Norleen Lynwood ORN, MD  Continuous Glucose Sensor (DEXCOM G6 SENSOR) MISC Use as directed apply one every 10 days 09/04/23   Norleen Lynwood ORN, MD  Continuous Glucose Transmitter (DEXCOM G6 TRANSMITTER) MISC USE AS DIRECTED --ONCE  A  DAY 01/06/24   Norleen Lynwood ORN, MD  insulin  glargine (LANTUS ) 100 UNIT/ML Solostar Pen Inject 45 Units into the skin daily. 09/05/23   Norleen Lynwood ORN, MD  Lancets MISC Use as directed once daily E11.9 01/31/20   Norleen Lynwood ORN, MD  ondansetron  (ZOFRAN ) 4 MG tablet Take 1 tablet (4 mg total) by mouth every 6 (six) hours. 11/19/22   Piontek, Rocky, MD  sildenafil  (VIAGRA ) 100 MG tablet Take 1 tablet (100 mg total) by mouth daily as needed for erectile dysfunction. 10/09/23   Norleen Lynwood ORN, MD  tadalafil  (CIALIS ) 20 MG tablet Take 0.5-1 tablets (10-20 mg total) by mouth every other day as needed for erectile dysfunction. 09/04/22   Norleen Lynwood ORN, MD    Family History Family History  Problem Relation Age of Onset   Heart failure Father    Heart attack Father    Kidney cancer Father    Diabetes Father    Non-Hodgkin's lymphoma Father    Colon cancer Paternal Grandmother    Hypertension Mother    Heart disease Mother    Kidney  disease Mother    Diabetes Mother    Hyperlipidemia Mother    Breast cancer Maternal Grandmother    Diabetes Maternal Grandmother    Hypertension Maternal Grandmother    Hyperlipidemia Maternal Grandmother    Diabetes Maternal Grandfather    Hyperlipidemia Maternal Grandfather    Congestive Heart Failure Maternal Grandfather    Healthy Paternal Grandfather    CAD Other        Aunt with CABG   Diabetes Brother    Colon cancer Paternal Uncle    Prostate cancer Paternal  Uncle    Sleep apnea Brother    Stroke Neg Hx     Social History Social History   Tobacco Use   Smoking status: Some Days    Types: Cigars, Cigarettes    Start date: 2017   Smokeless tobacco: Never   Tobacco comments:    1 cigar daily  Vaping Use   Vaping status: Never Used  Substance Use Topics   Alcohol use: Yes    Comment: occasional   Drug use: No     Allergies   Insulin  nph (human) (isophane) and Metformin  and related   Review of Systems Review of Systems Per HPI  Physical Exam Triage Vital Signs ED Triage Vitals  Encounter Vitals Group     BP 07/18/24 1004 (!) 150/92     Girls Systolic BP Percentile --      Girls Diastolic BP Percentile --      Boys Systolic BP Percentile --      Boys Diastolic BP Percentile --      Pulse Rate 07/18/24 1004 83     Resp 07/18/24 1004 18     Temp 07/18/24 1004 98.5 F (36.9 C)     Temp Source 07/18/24 1004 Oral     SpO2 07/18/24 1004 96 %     Weight 07/18/24 1003 (!) 311 lb (141.1 kg)     Height 07/18/24 1003 5' 10 (1.778 m)     Head Circumference --      Peak Flow --      Pain Score 07/18/24 1001 9     Pain Loc --      Pain Education --      Exclude from Growth Chart --    No data found.  Updated Vital Signs BP (!) 150/92 (BP Location: Right Arm)   Pulse 83   Temp 98.5 F (36.9 C) (Oral)   Resp 18   Ht 5' 10 (1.778 m)   Wt (!) 311 lb (141.1 kg)   SpO2 96%   BMI 44.62 kg/m   Visual Acuity Right Eye Distance:   Left Eye  Distance:   Bilateral Distance:    Right Eye Near:   Left Eye Near:    Bilateral Near:     Physical Exam Vitals and nursing note reviewed.  Constitutional:      Appearance: He is not ill-appearing or toxic-appearing.  HENT:     Head: Normocephalic and atraumatic.     Right Ear: Hearing and external ear normal.     Left Ear: Hearing and external ear normal.     Nose: Nose normal.     Mouth/Throat:     Lips: Pink.  Eyes:     General: Lids are normal. Vision grossly intact. Gaze aligned appropriately.     Extraocular Movements: Extraocular movements intact.     Conjunctiva/sclera: Conjunctivae normal.  Pulmonary:     Effort: Pulmonary effort is normal.  Musculoskeletal:     Cervical back: Neck supple.     Right lower leg: Normal.     Left lower leg: Swelling and tenderness present. No deformity, lacerations or bony tenderness. No edema.     Right ankle: Normal.     Right Achilles Tendon: Normal.     Left ankle: Normal.     Left Achilles Tendon: Normal. No tenderness. Thompson's test negative.     Right foot: Normal.     Left foot: Normal.       Legs:     Comments: Positive left  Homans' sign, negative left Thompson's sign.  Ambulatory with limp favoring left lower extremity.  Skin:    General: Skin is warm and dry.     Capillary Refill: Capillary refill takes less than 2 seconds.     Findings: No rash.  Neurological:     General: No focal deficit present.     Mental Status: He is alert and oriented to person, place, and time. Mental status is at baseline.     Cranial Nerves: No dysarthria or facial asymmetry.  Psychiatric:        Mood and Affect: Mood normal.        Speech: Speech normal.        Behavior: Behavior normal.        Thought Content: Thought content normal.        Judgment: Judgment normal.      UC Treatments / Results  Labs (all labs ordered are listed, but only abnormal results are displayed) Labs Reviewed - No data to  display  EKG   Radiology No results found.  Procedures Procedures (including critical care time)  Medications Ordered in UC Medications - No data to display  Initial Impression / Assessment and Plan / UC Course  I have reviewed the triage vital signs and the nursing notes.  Pertinent labs & imaging results that were available during my care of the patient were reviewed by me and considered in my medical decision making (see chart for details).   1.  Pain of left calf Positive left Homans' sign, negative left Thompson sign, low suspicion for ruptured Achilles tendon given atraumatic mechanism of injury.  DVT is lower on the differential, this is more likely caused by muscle spasm, however patient has attempted use of ibuprofen , Tylenol , lidocaine  patches, and muscle relaxer without any relief of pain and swelling.  There is scant erythema over the area of tenderness and swelling to the left calf.   Recommend further workup and evaluation in the emergency department setting to rule out DVT with imaging that we do not have here in the urgent care setting to be able to be ordered outpatient on an urgent basis today. Patient given crutches for ambulation assistance due to severe pain of the left calf prior to discharge from urgent care.  Discussed clinical concerns/exam findings leading to recommendation for further workup in the ER setting and risks of deferring ER visit with patient/family. Patient/family express understanding and agreement with plan, discharged to ER via private car.  Final Clinical Impressions(s) / UC Diagnoses   Final diagnoses:  Pain of left calf     Discharge Instructions      Please go to the ER for further workup and evaluation to rule out a blood clot to the left leg causing your pain and swelling.      ED Prescriptions   None    PDMP not reviewed this encounter.   Enedelia Dorna HERO, [place] 07/18/24 1125

## 2024-07-18 NOTE — Progress Notes (Signed)
 Left lower ext venous  has been completed. Refer to [facility] under chart review to view preliminary results.   07/18/2024  12:25 PM Sanita Estrada, Ricka BIRCH

## 2024-07-18 NOTE — Discharge Instructions (Addendum)
 Please go to the ER for further workup and evaluation to rule out a blood clot to the left leg causing your pain and swelling.

## 2024-07-18 NOTE — Discharge Instructions (Addendum)
 You are found to have very small blood clots in your calf muscle veins.  This does not constitute a DVT which is a deep vein thrombosis.  We are to hold off on any blood thinners and recommend Tylenol  ibuprofen  and elevation and compression with an Ace wrap.  I would like you to follow-up with the vascular doctors.  Please return the emergency room for new or concerning symptom such as fever worsening swelling or any other symptoms.  I am treating with some Keflex  to ensure that if there is any degree of cellulitis or skin infection that that improves.   Please use Tylenol  or ibuprofen  for pain.  You may use 600 mg ibuprofen  every 6 hours or 1000 mg of Tylenol  every 6 hours.  You may choose to alternate between the 2.  This would be most effective.  Not to exceed 4 g of Tylenol  within 24 hours.  Not to exceed 3200 mg ibuprofen  24 hours.

## 2024-07-20 ENCOUNTER — Telehealth: Payer: Self-pay | Admitting: Pharmacist

## 2024-07-20 NOTE — Telephone Encounter (Signed)
 Received a call from patient who was confused as to why he did not need to be seen in the DVT Clinic. He went to the ED on 07/18/24 with complaint of left leg pain. Ultrasound showed acute intramuscular thrombosis involving the left gastrocnemius veins and no evidence of DVT. ED recommended treatment with NSAIDs, ACE wrap, and vascular follow up. Discussed with the patient that per DVT Clinic and vascular surgery protocol, we do not typically see patients with isolated intramuscular vein thrombosis as these do not require anticoagulation or vascular intervention. Anticoagulation does not dissolve clots. Anticoagulation prevents thrombus extension and formation of new clots and the body dissolves the clot. Therefore, only clots at high risk for propagation require anticoagulation. Isolated intramuscular clots (gastrocnemius or soleal veins) are low risk for propagation, therefore do not require anticoagulation. Per DVT Clinic and vascular surgery protocol, recommend treatment with aspirin  81 mg daily and warm compresses for isolated intramuscular vein thrombosis. Recommend follow up with PCP or urgent care to repeat ultrasound if symptoms worsen or do not improve within 1-2 weeks to check for propagation to a deep vein. Patient expressed understanding of this information. He reported that his symptoms are stable and he has been taking ibuprofen  for the past 2 days. He denied chest pain and SOB. Patient reported he would call his PCP if his symptoms do not improve or worsen. ED precautions provided.

## 2024-07-23 ENCOUNTER — Encounter: Payer: Self-pay | Admitting: Internal Medicine

## 2024-07-23 ENCOUNTER — Ambulatory Visit: Admitting: Internal Medicine

## 2024-07-23 VITALS — BP 128/78 | HR 70 | Temp 98.2°F | Ht 70.0 in | Wt 311.0 lb

## 2024-07-23 DIAGNOSIS — E559 Vitamin D deficiency, unspecified: Secondary | ICD-10-CM

## 2024-07-23 DIAGNOSIS — F17200 Nicotine dependence, unspecified, uncomplicated: Secondary | ICD-10-CM

## 2024-07-23 DIAGNOSIS — E1165 Type 2 diabetes mellitus with hyperglycemia: Secondary | ICD-10-CM

## 2024-07-23 DIAGNOSIS — I10 Essential (primary) hypertension: Secondary | ICD-10-CM

## 2024-07-23 MED ORDER — DOXYCYCLINE HYCLATE 100 MG PO TABS: 100.0000 mg | ORAL_TABLET | Freq: Two times a day (BID) | ORAL | 0 refills | Status: AC

## 2024-07-23 MED ORDER — LINACLOTIDE 145 MCG PO CAPS: 145.0000 ug | ORAL_CAPSULE | Freq: Every day | ORAL | 11 refills | Status: AC

## 2024-07-23 NOTE — Patient Instructions (Signed)
 Your left leg should be healed at about 4 wks  Please take all new medication as prescribed  - the antibiotic, and the linzess as needed  Please continue all other medications as before, and refills have been done if requested.  Please have the pharmacy call with any other refills you may need.  Please keep your appointments with your specialists as you may have planned  Please make an Appointment to return in 6 months, or sooner if needed

## 2024-07-23 NOTE — Progress Notes (Deleted)
 Patient ID: Caleb [name], male   DOB: Nov 01, 1980, 43 y.o.   MRN: [id number]        Chief Complaint: follow up HTN, HLD and hyperglycemia ***       HPI:  Caleb [name] is a 43 y.o. male here with c/o        Wt Readings from Last 3 Encounters:  07/23/24 (!) 311 lb (141.1 kg)  07/18/24 (!) 311 lb (141.1 kg)  07/18/24 (!) 311 lb (141.1 kg)   BP Readings from Last 3 Encounters:  07/23/24 128/78  07/18/24 (!) 150/99  07/18/24 (!) 152/92         Past Medical History:  Diagnosis Date   Cardiac tamponade 10/2013   Enlarged pulmonary artery (HCC)    a. Mild enlargement of pulmonary artery by CT 11/01/13.   GERD (gastroesophageal reflux disease)    HTN (hypertension)    Hx of echocardiogram    Echo (12/14/13):  Mod LVH, EF 50-55%, no RWMA, trivial Eff.   LV dysfunction    a. 10/2013:  EF 45-50% with followup echo 55-60%.   Morbid obesity (HCC)    Pericarditis    a. 10/2013: Acute pericarditis with cardiac tamponade s/p pericardiocentesis.   Sleep apnea    Type 2 diabetes mellitus without complication, without long-term current use of insulin  (HCC) 01/02/2017   Past Surgical History:  Procedure Laterality Date   PERICARDIAL TAP N/A 11/02/2013   Procedure: PERICARDIAL TAP;  Surgeon: Debby DELENA Sor, MD;  Location: [facility] CATH LAB;  Service: Cardiovascular;  Laterality: N/A;    reports that he has been smoking cigars and cigarettes. He started smoking about 8 years ago. He has never used smokeless tobacco. He reports current alcohol use. He reports that he does not use drugs. family history includes Breast cancer in his maternal grandmother; CAD in an other family member; Colon cancer in his paternal grandmother and paternal uncle; Congestive Heart Failure in his maternal grandfather; Diabetes in his brother, father, maternal grandfather, maternal grandmother, and mother; Healthy in his paternal grandfather; Heart attack in his father; Heart disease in his mother; Heart failure in his father;  Hyperlipidemia in his maternal grandfather, maternal grandmother, and mother; Hypertension in his maternal grandmother and mother; Kidney cancer in his father; Kidney disease in his mother; Non-Hodgkin's lymphoma in his father; Prostate cancer in his paternal uncle; Sleep apnea in his brother. Allergies[1] Medications Ordered Prior to Encounter[2]      ROS:  All others reviewed and negative.  Objective        PE:  BP 128/78 (BP Location: Right Arm, Patient Position: Sitting, Cuff Size: Normal)   Pulse 70   Temp 98.2 F (36.8 C) (Oral)   Ht 5' 10 (1.778 m)   Wt (!) 311 lb (141.1 kg)   SpO2 99%   BMI 44.62 kg/m                 Constitutional: Pt appears in NAD               HENT: Head: NCAT.                Right Ear: External ear normal.                 Left Ear: External ear normal.                Eyes: . Pupils are equal, round, and reactive to light. Conjunctivae and EOM are normal  Nose: without d/c or deformity               Neck: Neck supple. Gross normal ROM               Cardiovascular: Normal rate and regular rhythm.                 Pulmonary/Chest: Effort normal and breath sounds without rales or wheezing.                Abd:  Soft, NT, ND, + BS, no organomegaly               Neurological: Pt is alert. At baseline orientation, motor grossly intact               Skin: Skin is warm. No rashes, no other new lesions, LE edema - ***               Psychiatric: Pt behavior is normal without agitation   Micro: none  Cardiac tracings I have personally interpreted today:  none  Pertinent Radiological findings (summarize): none   Lab Results  Component Value Date   WBC 6.6 07/18/2024   HGB 13.7 07/18/2024   HCT 42.9 07/18/2024   PLT 290 07/18/2024   GLUCOSE 99 07/18/2024   CHOL 132 09/04/2023   TRIG 86.0 09/04/2023   HDL 52.80 09/04/2023   LDLDIRECT 112.0 03/22/2021   LDLCALC 62 09/04/2023   ALT 16 07/18/2024   AST 19 07/18/2024   NA 139 07/18/2024   K  4.1 07/18/2024   CL 104 07/18/2024   CREATININE 1.18 07/18/2024   BUN 16 07/18/2024   CO2 27 07/18/2024   TSH 1.04 09/04/2023   PSA 0.45 09/04/2023   INR 1.0 07/06/2020   HGBA1C 6.7 (H) 02/27/2024   MICROALBUR 2.3 09/27/2021   Assessment/Plan:  Caleb [name] is a 43 y.o. Black or African American [2] male with  has a past medical history of Cardiac tamponade (10/2013), Enlarged pulmonary artery (HCC), GERD (gastroesophageal reflux disease), HTN (hypertension), echocardiogram, LV dysfunction, Morbid obesity (HCC), Pericarditis, Sleep apnea, and Type 2 diabetes mellitus without complication, without long-term current use of insulin  (HCC) (01/02/2017).  No problem-specific Assessment & Plan notes found for this encounter.  Followup: No follow-ups on file.  Lynwood Rush, MD 07/23/2024 3:29 PM [facility]    [1]  Allergies Allergen Reactions   Insulin  Nph (Human) (Isophane) Hives   Metformin  And Related Diarrhea  [2]  Current Outpatient Medications on File Prior to Visit  Medication Sig Dispense Refill   albuterol  (VENTOLIN  HFA) 108 (90 Base) MCG/ACT inhaler Inhale 1-2 puffs into the lungs every 6 (six) hours as needed for wheezing or shortness of breath. 8 g 1   amLODipine  (NORVASC ) 5 MG tablet Take 1 tablet (5 mg total) by mouth daily. 90 tablet 3   atorvastatin  (LIPITOR) 80 MG tablet Take 1 tablet (80 mg total) by mouth daily. 90 tablet 3   cephALEXin  (KEFLEX ) 500 MG capsule Take 1 capsule (500 mg total) by mouth 4 (four) times daily. 20 capsule 0   Cholecalciferol  50 MCG (2000 UT) TABS 1 tab by mouth once daily 30 tablet 99   clotrimazole -betamethasone  (LOTRISONE ) cream Apply 1 application topically daily. 30 g 0   Continuous Blood Gluc Receiver (DEXCOM G6 RECEIVER) DEVI Use as directed once daily E11.9 1 each 0   Continuous Glucose Sensor (DEXCOM G6 SENSOR) MISC Use as directed  apply one every 10 days 3 each 11    Continuous Glucose Transmitter (DEXCOM G6 TRANSMITTER) MISC USE AS DIRECTED --ONCE  A  DAY 1 each 0   glipiZIDE  (GLUCOTROL  XL) 10 MG 24 hr tablet Take 1 tablet (10 mg total) by mouth daily with breakfast. 90 tablet 3   ibuprofen  (ADVIL ) 800 MG tablet Take 1 tablet (800 mg total) by mouth every 8 (eight) hours as needed for moderate pain. 50 tablet 0   insulin  glargine (LANTUS ) 100 UNIT/ML Solostar Pen Inject 45 Units into the skin daily. 15 mL 11   Lancets MISC Use as directed once daily E11.9 100 each 12   losartan  (COZAAR ) 100 MG tablet Take 1 tablet (100 mg total) by mouth daily. 90 tablet 3   meloxicam  (MOBIC ) 15 MG tablet TAKE 1 TABLET BY MOUTH ONCE DAILY AS NEEDED FOR PAIN 90 tablet 0   ondansetron  (ZOFRAN ) 4 MG tablet Take 1 tablet (4 mg total) by mouth every 6 (six) hours. 12 tablet 0   pantoprazole  (PROTONIX ) 40 MG tablet Take 1 tablet (40 mg total) by mouth 2 (two) times daily. 180 tablet 3   sildenafil  (VIAGRA ) 100 MG tablet Take 1 tablet (100 mg total) by mouth daily as needed for erectile dysfunction. 10 tablet 11   tadalafil  (CIALIS ) 20 MG tablet Take 0.5-1 tablets (10-20 mg total) by mouth every other day as needed for erectile dysfunction. 10 tablet 11   tirzepatide  (MOUNJARO ) 7.5 MG/0.5ML Pen Inject 7.5 mg into the skin once a week. 6 mL 3   No current facility-administered medications on file prior to visit.

## 2024-07-25 DIAGNOSIS — S86111D Strain of other muscle(s) and tendon(s) of posterior muscle group at lower leg level, right leg, subsequent encounter: Secondary | ICD-10-CM | POA: Insufficient documentation

## 2024-07-25 DIAGNOSIS — K5904 Chronic idiopathic constipation: Secondary | ICD-10-CM | POA: Insufficient documentation

## 2024-07-25 DIAGNOSIS — L03317 Cellulitis of buttock: Secondary | ICD-10-CM | POA: Insufficient documentation

## 2024-07-25 NOTE — Assessment & Plan Note (Signed)
 Incomplete tearing with intramuscular thrombosis, ambulates ok and pain now mild to mod tolerable.

## 2024-07-25 NOTE — Assessment & Plan Note (Signed)
 With red, tender swelling to the bilateral most upper buttocks and natal cleft area without abscess, Ok to stop keflex , but start doxycyline 100 bid course

## 2024-07-25 NOTE — Assessment & Plan Note (Signed)
 BP Readings from Last 3 Encounters:  07/23/24 128/78  07/18/24 (!) 150/99  07/18/24 (!) 152/92   Stable, pt to continue medical treatment norvasc  5 every day, losartan  100 mg qd

## 2024-07-25 NOTE — Assessment & Plan Note (Signed)
 With recent worsening, otc preps not working including miralax , ok for linzess  145 mg qd

## 2024-07-25 NOTE — Assessment & Plan Note (Addendum)
 With insulin , with hyperglycemia  Lab Results  Component Value Date   HGBA1C 6.7 (H) 02/27/2024   Stable, pt to continue current medical treatment glucotrol  xl 10 every day lantus  45 u every day, mounjaro  7.5 mg weekly

## 2024-07-25 NOTE — Assessment & Plan Note (Signed)
 Pt counsled to quit, pt not ready

## 2024-07-25 NOTE — Progress Notes (Signed)
 Chief Complaint: follow up ED visit dec 7 with left gastrocnemius intramuscular , natal cleft cellulitis, constipation, dm, smoker, htn       HPI:  Caleb [name] is a 43 y.o. male here after seen at ED dec 7 with left calf pain after extending the back while reclined in the chair; found on lmaging to have left gatroc intramuscular thrombosis.  Given cephalin at d/c and has been taking, but now has red, tender swelling to the bilateral upper buttocks and natal cleft without fever or drainage.  Pt denies chest pain, increased sob or doe, wheezing, orthopnea, PND, increased LE swelling, palpitations, dizziness or syncope.  Still smoking, not ready to quit.   Pt denies polydipsia, polyuria, or new focal neuro s/s.   Denies worsening reflux, abd pain, dysphagia, n/v, or blood but has had persistent several weeks of constipation not improved with otc preps including miralax .         Wt Readings from Last 3 Encounters:  07/23/24 (!) 311 lb (141.1 kg)  07/18/24 (!) 311 lb (141.1 kg)  07/18/24 (!) 311 lb (141.1 kg)   BP Readings from Last 3 Encounters:  07/23/24 128/78  07/18/24 (!) 150/99  07/18/24 (!) 152/92         Past Medical History:  Diagnosis Date   Cardiac tamponade 10/2013   Enlarged pulmonary artery (HCC)    a. Mild enlargement of pulmonary artery by CT 11/01/13.   GERD (gastroesophageal reflux disease)    HTN (hypertension)    Hx of echocardiogram    Echo (12/14/13):  Mod LVH, EF 50-55%, no RWMA, trivial Eff.   LV dysfunction    a. 10/2013:  EF 45-50% with followup echo 55-60%.   Morbid obesity (HCC)    Pericarditis    a. 10/2013: Acute pericarditis with cardiac tamponade s/p pericardiocentesis.   Sleep apnea    Type 2 diabetes mellitus without complication, without long-term current use of insulin  (HCC) 01/02/2017   Past Surgical History:  Procedure Laterality Date   PERICARDIAL TAP N/A 11/02/2013   Procedure: PERICARDIAL TAP;  Surgeon: Debby DELENA Sor, MD;  Location:  [facility] CATH LAB;  Service: Cardiovascular;  Laterality: N/A;    reports that he has been smoking cigars and cigarettes. He started smoking about 8 years ago. He has never used smokeless tobacco. He reports current alcohol use. He reports that he does not use drugs. family history includes Breast cancer in his maternal grandmother; CAD in an other family member; Colon cancer in his paternal grandmother and paternal uncle; Congestive Heart Failure in his maternal grandfather; Diabetes in his brother, father, maternal grandfather, maternal grandmother, and mother; Healthy in his paternal grandfather; Heart attack in his father; Heart disease in his mother; Heart failure in his father; Hyperlipidemia in his maternal grandfather, maternal grandmother, and mother; Hypertension in his maternal grandmother and mother; Kidney cancer in his father; Kidney disease in his mother; Non-Hodgkin's lymphoma in his father; Prostate cancer in his paternal uncle; Sleep apnea in his brother. Allergies[1] Outpatient Encounter Medications as of 07/23/2024  Medication Sig   albuterol  (VENTOLIN  HFA) 108 (90 Base) MCG/ACT inhaler Inhale 1-2 puffs into the lungs every 6 (six) hours as needed for wheezing or shortness of breath.   amLODipine  (NORVASC ) 5 MG tablet Take 1 tablet (5 mg total) by mouth daily.   atorvastatin  (LIPITOR) 80 MG tablet Take 1 tablet (80 mg total) by mouth daily.   cephALEXin  (  KEFLEX ) 500 MG capsule Take 1 capsule (500 mg total) by mouth 4 (four) times daily.   Cholecalciferol  50 MCG (2000 UT) TABS 1 tab by mouth once daily   clotrimazole -betamethasone  (LOTRISONE ) cream Apply 1 application topically daily.   Continuous Blood Gluc Receiver (DEXCOM G6 RECEIVER) DEVI Use as directed once daily E11.9   Continuous Glucose Sensor (DEXCOM G6 SENSOR) MISC Use as directed apply one every 10 days   Continuous Glucose Transmitter (DEXCOM G6 TRANSMITTER) MISC USE AS DIRECTED --ONCE  A  DAY   doxycycline  (VIBRA -TABS) 100  MG tablet Take 1 tablet (100 mg total) by mouth 2 (two) times daily.   glipiZIDE  (GLUCOTROL  XL) 10 MG 24 hr tablet Take 1 tablet (10 mg total) by mouth daily with breakfast.   ibuprofen  (ADVIL ) 800 MG tablet Take 1 tablet (800 mg total) by mouth every 8 (eight) hours as needed for moderate pain.   insulin  glargine (LANTUS ) 100 UNIT/ML Solostar Pen Inject 45 Units into the skin daily.   Lancets MISC Use as directed once daily E11.9   linaclotide  (LINZESS ) 145 MCG CAPS capsule Take 1 capsule (145 mcg total) by mouth daily before breakfast.   losartan  (COZAAR ) 100 MG tablet Take 1 tablet (100 mg total) by mouth daily.   meloxicam  (MOBIC ) 15 MG tablet TAKE 1 TABLET BY MOUTH ONCE DAILY AS NEEDED FOR PAIN   ondansetron  (ZOFRAN ) 4 MG tablet Take 1 tablet (4 mg total) by mouth every 6 (six) hours.   pantoprazole  (PROTONIX ) 40 MG tablet Take 1 tablet (40 mg total) by mouth 2 (two) times daily.   sildenafil  (VIAGRA ) 100 MG tablet Take 1 tablet (100 mg total) by mouth daily as needed for erectile dysfunction.   tadalafil  (CIALIS ) 20 MG tablet Take 0.5-1 tablets (10-20 mg total) by mouth every other day as needed for erectile dysfunction.   tirzepatide  (MOUNJARO ) 7.5 MG/0.5ML Pen Inject 7.5 mg into the skin once a week.   No facility-administered encounter medications on file as of 07/23/2024.        ROS:  All others reviewed and negative.  Objective        PE:  BP 128/78 (BP Location: Right Arm, Patient Position: Sitting, Cuff Size: Normal)   Pulse 70   Temp 98.2 F (36.8 C) (Oral)   Ht 5' 10 (1.778 m)   Wt (!) 311 lb (141.1 kg)   SpO2 99%   BMI 44.62 kg/m                 Constitutional: Pt appears in NAD               HENT: Head: NCAT.                Right Ear: External ear normal.                 Left Ear: External ear normal.                Eyes: . Pupils are equal, round, and reactive to light. Conjunctivae and EOM are normal               Nose: without d/c or deformity                Neck: Neck supple. Gross normal ROM               Cardiovascular: Normal rate and regular rhythm.  Pulmonary/Chest: Effort normal and breath sounds without rales or wheezing.                Abd:  Soft, NT, ND, + BS, no organomegaly; left calf mild tender swelling o/w neurovasc intact               Neurological: Pt is alert. At baseline orientation, motor grossly intact               Skin: Skin is warm. No rashes, no other new lesions, LE edema - none, has bilateral upper buttocks natal cleft red, tender swelling               Psychiatric: Pt behavior is normal without agitation   Micro: none  Cardiac tracings I have personally interpreted today:  none  Pertinent Radiological findings (summarize): none   Lab Results  Component Value Date   WBC 6.6 07/18/2024   HGB 13.7 07/18/2024   HCT 42.9 07/18/2024   PLT 290 07/18/2024   GLUCOSE 99 07/18/2024   CHOL 132 09/04/2023   TRIG 86.0 09/04/2023   HDL 52.80 09/04/2023   LDLDIRECT 112.0 03/22/2021   LDLCALC 62 09/04/2023   ALT 16 07/18/2024   AST 19 07/18/2024   NA 139 07/18/2024   K 4.1 07/18/2024   CL 104 07/18/2024   CREATININE 1.18 07/18/2024   BUN 16 07/18/2024   CO2 27 07/18/2024   TSH 1.04 09/04/2023   PSA 0.45 09/04/2023   INR 1.0 07/06/2020   HGBA1C 6.7 (H) 02/27/2024   MICROALBUR 2.3 09/27/2021   Assessment/Plan:  Caleb [name] is a 43 y.o. Black or African American [2] male with  has a past medical history of Cardiac tamponade (10/2013), Enlarged pulmonary artery (HCC), GERD (gastroesophageal reflux disease), HTN (hypertension), echocardiogram, LV dysfunction, Morbid obesity (HCC), Pericarditis, Sleep apnea, and Type 2 diabetes mellitus without complication, without long-term current use of insulin  (HCC) (01/02/2017).  Type 2 diabetes mellitus (HCC) With insulin , with hyperglycemia  Lab Results  Component Value Date   HGBA1C 6.7 (H) 02/27/2024   Stable, pt to continue current medical treatment  glucotrol  xl 10 every day lantus  45 u every day, mounjaro  7.5 mg weekly   Smoker Pt counsled to quit, pt not ready  Gastrocnemius muscle rupture, right, subsequent encounter Incomplete tearing with intramuscular thrombosis, ambulates ok and pain now mild to mod tolerable.   Essential hypertension BP Readings from Last 3 Encounters:  07/23/24 128/78  07/18/24 (!) 150/99  07/18/24 (!) 152/92   Stable, pt to continue medical treatment norvasc  5 every day, losartan  100 mg qd   Chronic idiopathic constipation With recent worsening, otc preps not working including miralax , ok for linzess  145 mg qd  Cellulitis of multiple sites of buttock With red, tender swelling to the bilateral most upper buttocks and natal cleft area without abscess, Ok to stop keflex , but start doxycyline 100 bid course Followup: No follow-ups on file.  Lynwood Rush, MD 07/25/2024 1:43 PM [facility] Internal Medicine     [1]  Allergies Allergen Reactions   Insulin  Nph (Human) (Isophane) Hives   Metformin  And Related Diarrhea

## 2024-12-02 ENCOUNTER — Ambulatory Visit: Admitting: Family Medicine
